# Patient Record
Sex: Male | Born: 1935 | Race: White | Hispanic: No | Marital: Married | Smoking: Never smoker
Health system: Southern US, Community
[De-identification: ages and names within clinical notes are randomized; demographics above are authoritative.]

## PROBLEM LIST (undated history)

## (undated) DIAGNOSIS — E781 Pure hyperglyceridemia: Secondary | ICD-10-CM

## (undated) DIAGNOSIS — R55 Syncope and collapse: Secondary | ICD-10-CM

## (undated) DIAGNOSIS — D696 Thrombocytopenia, unspecified: Secondary | ICD-10-CM

## (undated) DIAGNOSIS — Z91014 Allergy to mammalian meats: Secondary | ICD-10-CM

## (undated) DIAGNOSIS — Z8619 Personal history of other infectious and parasitic diseases: Secondary | ICD-10-CM

## (undated) DIAGNOSIS — K219 Gastro-esophageal reflux disease without esophagitis: Secondary | ICD-10-CM

## (undated) DIAGNOSIS — E785 Hyperlipidemia, unspecified: Secondary | ICD-10-CM

## (undated) DIAGNOSIS — Z833 Family history of diabetes mellitus: Secondary | ICD-10-CM

## (undated) DIAGNOSIS — E039 Hypothyroidism, unspecified: Secondary | ICD-10-CM

## (undated) DIAGNOSIS — Z91018 Allergy to other foods: Secondary | ICD-10-CM

## (undated) HISTORY — DX: Allergy to mammalian meats: Z91.014

## (undated) HISTORY — DX: Hypothyroidism, unspecified: E03.9

## (undated) HISTORY — DX: Thrombocytopenia, unspecified: D69.6

## (undated) HISTORY — DX: Allergy to other foods: Z91.018

## (undated) HISTORY — DX: Pure hyperglyceridemia: E78.1

## (undated) HISTORY — DX: Syncope and collapse: R55

## (undated) HISTORY — DX: Hyperlipidemia, unspecified: E78.5

## (undated) HISTORY — DX: Gastro-esophageal reflux disease without esophagitis: K21.9

## (undated) HISTORY — DX: Family history of diabetes mellitus: Z83.3

## (undated) HISTORY — DX: Personal history of other infectious and parasitic diseases: Z86.19

---
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
## 1997-10-09 ENCOUNTER — Ambulatory Visit (HOSPITAL_BASED_OUTPATIENT_CLINIC_OR_DEPARTMENT_OTHER): Admission: RE | Admit: 1997-10-09 | Discharge: 1997-10-09 | Payer: Self-pay | Admitting: Plastic Surgery

## 2000-04-10 ENCOUNTER — Encounter: Admission: RE | Admit: 2000-04-10 | Discharge: 2000-04-10 | Payer: Self-pay | Admitting: Family Medicine

## 2000-04-10 ENCOUNTER — Encounter: Payer: Self-pay | Admitting: Family Medicine

## 2008-03-20 ENCOUNTER — Encounter: Payer: Self-pay | Admitting: Family Medicine

## 2008-03-20 LAB — CONVERTED CEMR LAB
ALT: 40 units/L
AST: 32 units/L
Albumin: 4.1 g/dL
Alkaline Phosphatase: 68 units/L
Anion Gap: 11.3
BUN: 14 mg/dL
CO2: 29 meq/L
Calcium: 9 mg/dL
Chloride: 103 meq/L
Cholesterol: 260 mg/dL
Creatinine, Ser: 0.8 mg/dL
Free T4: 0.89 ng/dL
GFR calc Af Amer: 113.72 mL/min
GFR calc non Af Amer: 93.99 mL/min
Glucose, Bld: 89 mg/dL
HCT: 46.7 %
HDL: 54 mg/dL
Hemoglobin: 16.3 g/dL
LDL Cholesterol: 108 mg/dL
MCHC: 35 g/dL
MCV: 94.4 fL
Platelets: 131 10*3/uL
Potassium: 4.3 meq/L
RBC: 4.95 M/uL
RDW: 12.5 %
Sodium: 139 meq/L
TSH: 1.83 microintl units/mL
Total Bilirubin: 1.1 mg/dL
Total CHOL/HDL Ratio: 4.81
Total Protein: 6.9 g/dL
Triglycerides: 460 mg/dL
WBC: 4.4 10*3/uL

## 2008-05-19 ENCOUNTER — Encounter: Payer: Self-pay | Admitting: Family Medicine

## 2008-05-19 LAB — CONVERTED CEMR LAB
ALT: 103 units/L
AST: 73 units/L
Albumin: 4.7 g/dL
Alkaline Phosphatase: 110 units/L
Bilirubin, Direct: 0.16 mg/dL
Cholesterol: 221 mg/dL
HDL: 49 mg/dL
LDL Cholesterol: 151 mg/dL
TSH: 2.53 microintl units/mL
Total Bilirubin: 0.6 mg/dL
Total CHOL/HDL Ratio: 2.9
Total Protein: 7.2 g/dL
Triglycerides: 140 mg/dL
VLDL: 28 mg/dL

## 2008-06-15 ENCOUNTER — Emergency Department: Payer: Self-pay | Admitting: Unknown Physician Specialty

## 2008-06-30 ENCOUNTER — Encounter: Payer: Self-pay | Admitting: Family Medicine

## 2008-06-30 LAB — CONVERTED CEMR LAB
ALT: 37 units/L
AST: 32 units/L
Albumin: 3.8 g/dL
Alkaline Phosphatase: 71 units/L
Bilirubin, Direct: 0.1 mg/dL
Indirect Bilirubin: 0.7 mg/dL
Total Bilirubin: 0.8 mg/dL
Total Protein: 6.3 g/dL

## 2008-09-04 ENCOUNTER — Encounter: Payer: Self-pay | Admitting: Family Medicine

## 2008-09-04 LAB — CONVERTED CEMR LAB
ALT: 25 units/L
AST: 26 units/L
Albumin: 3.7 g/dL
Alkaline Phosphatase: 60 units/L
Anion Gap: 12.1
BUN: 14 mg/dL
CO2: 28 meq/L
Calcium: 8.9 mg/dL
Chloride: 102 meq/L
Cholesterol: 182 mg/dL
Creatinine, Ser: 0.8 mg/dL
GFR calc Af Amer: 113.42 mL/min
GFR calc non Af Amer: 93.74 mL/min
Glucose, Bld: 88 mg/dL
HDL: 43 mg/dL
LDL Cholesterol: 110 mg/dL
Potassium: 4.1 meq/L
Sodium: 138 meq/L
TSH: 2.32 microintl units/mL
Total Bilirubin: 1.1 mg/dL
Total CHOL/HDL Ratio: 4.23
Total Protein: 6.1 g/dL
Triglycerides: 113 mg/dL

## 2009-02-03 ENCOUNTER — Encounter: Payer: Self-pay | Admitting: Family Medicine

## 2009-03-23 ENCOUNTER — Encounter: Payer: Self-pay | Admitting: Family Medicine

## 2009-03-23 LAB — CONVERTED CEMR LAB
ALT: 29 units/L
AST: 26 units/L
Albumin: 4.1 g/dL
Alkaline Phosphatase: 60 units/L
Anion Gap: 12.9
BUN: 13 mg/dL
CO2: 29 meq/L
Calcium: 9.3 mg/dL
Chloride: 101 meq/L
Cholesterol: 241 mg/dL
Creatinine, Ser: 0.8 mg/dL
Glucose, Bld: 93 mg/dL
HDL: 49 mg/dL
LDL Cholesterol: 100 mg/dL
PSA: 1.77 ng/mL
Potassium: 4.9 meq/L
Sodium: 138 meq/L
TSH: 2.43 microintl units/mL
Total Bilirubin: 0.9 mg/dL
Total CHOL/HDL Ratio: 4.92
Total Protein: 6.7 g/dL
Triglycerides: 368 mg/dL

## 2009-09-25 ENCOUNTER — Encounter: Payer: Self-pay | Admitting: Family Medicine

## 2009-09-25 DIAGNOSIS — R51 Headache: Secondary | ICD-10-CM

## 2009-09-25 DIAGNOSIS — R519 Headache, unspecified: Secondary | ICD-10-CM | POA: Insufficient documentation

## 2009-09-25 DIAGNOSIS — K219 Gastro-esophageal reflux disease without esophagitis: Secondary | ICD-10-CM

## 2009-09-25 DIAGNOSIS — E785 Hyperlipidemia, unspecified: Secondary | ICD-10-CM | POA: Insufficient documentation

## 2009-09-25 DIAGNOSIS — D696 Thrombocytopenia, unspecified: Secondary | ICD-10-CM

## 2009-09-28 DIAGNOSIS — E039 Hypothyroidism, unspecified: Secondary | ICD-10-CM

## 2009-09-28 DIAGNOSIS — M545 Low back pain, unspecified: Secondary | ICD-10-CM | POA: Insufficient documentation

## 2009-09-28 DIAGNOSIS — M519 Unspecified thoracic, thoracolumbar and lumbosacral intervertebral disc disorder: Secondary | ICD-10-CM | POA: Insufficient documentation

## 2009-09-28 DIAGNOSIS — IMO0001 Reserved for inherently not codable concepts without codable children: Secondary | ICD-10-CM | POA: Insufficient documentation

## 2009-10-13 ENCOUNTER — Ambulatory Visit: Payer: Self-pay | Admitting: Family Medicine

## 2009-10-14 ENCOUNTER — Ambulatory Visit: Payer: Self-pay | Admitting: Family Medicine

## 2009-10-15 LAB — CONVERTED CEMR LAB
Cholesterol: 188 mg/dL (ref 0–200)
HDL: 38.5 mg/dL — ABNORMAL LOW (ref >39.00)
LDL Cholesterol: 111 mg/dL — ABNORMAL HIGH (ref 0–99)
Total CHOL/HDL Ratio: 5
Triglycerides: 193 mg/dL — ABNORMAL HIGH (ref 0.0–149.0)
VLDL: 38.6 mg/dL (ref 0.0–40.0)

## 2009-10-22 ENCOUNTER — Ambulatory Visit: Payer: Self-pay | Admitting: Family Medicine

## 2009-10-22 DIAGNOSIS — K649 Unspecified hemorrhoids: Secondary | ICD-10-CM | POA: Insufficient documentation

## 2009-12-06 ENCOUNTER — Emergency Department (HOSPITAL_COMMUNITY): Admission: EM | Admit: 2009-12-06 | Discharge: 2009-12-06 | Payer: Self-pay | Admitting: Family Medicine

## 2009-12-07 ENCOUNTER — Telehealth: Payer: Self-pay | Admitting: Family Medicine

## 2009-12-07 ENCOUNTER — Ambulatory Visit: Payer: Self-pay | Admitting: Family Medicine

## 2009-12-09 ENCOUNTER — Telehealth: Payer: Self-pay | Admitting: Family Medicine

## 2010-01-13 ENCOUNTER — Ambulatory Visit: Payer: Self-pay | Admitting: Family Medicine

## 2010-03-12 ENCOUNTER — Telehealth (INDEPENDENT_AMBULATORY_CARE_PROVIDER_SITE_OTHER): Payer: Self-pay | Admitting: *Deleted

## 2010-03-16 ENCOUNTER — Telehealth: Payer: Self-pay | Admitting: Family Medicine

## 2010-03-17 ENCOUNTER — Ambulatory Visit: Payer: Self-pay | Admitting: Family Medicine

## 2010-03-17 DIAGNOSIS — I10 Essential (primary) hypertension: Secondary | ICD-10-CM

## 2010-03-17 LAB — CONVERTED CEMR LAB
ALT: 27 units/L (ref 0–53)
AST: 25 units/L (ref 0–37)
Bilirubin, Direct: 0.1 mg/dL (ref 0.0–0.3)
TSH: 3.03 microintl units/mL (ref 0.35–5.50)
Total Bilirubin: 1 mg/dL (ref 0.3–1.2)
Total CHOL/HDL Ratio: 5
Triglycerides: 252 mg/dL — ABNORMAL HIGH (ref 0.0–149.0)

## 2010-03-25 ENCOUNTER — Ambulatory Visit
Admission: RE | Admit: 2010-03-25 | Discharge: 2010-03-25 | Payer: Self-pay | Source: Home / Self Care | Attending: Family Medicine | Admitting: Family Medicine

## 2010-03-25 ENCOUNTER — Encounter: Payer: Self-pay | Admitting: Family Medicine

## 2010-03-28 ENCOUNTER — Telehealth: Payer: Self-pay | Admitting: Family Medicine

## 2010-04-20 NOTE — Assessment & Plan Note (Signed)
Summary: FLU SHOT/CLE  Nurse Visit   Allergies: 1)  ! Sulfa 2)  ! Lipitor (Atorvastatin Calcium) 3)  ! Zocor (Simvastatin) 4)  ! Zetia (Ezetimibe)  Orders Added: 1)  Flu Vaccine 89yrs + MEDICARE PATIENTS [Q2039] 2)  Administration Flu vaccine - MCR [G0008] Flu Vaccine Consent Questions     Do you have a history of severe allergic reactions to this vaccine? no    Any prior history of allergic reactions to egg and/or gelatin? no    Do you have a sensitivity to the preservative Thimersol? no    Do you have a past history of Guillan-Barre Syndrome? no    Do you currently have an acute febrile illness? no    Have you ever had a severe reaction to latex? no    Vaccine information given and explained to patient? yes    Are you currently pregnant? no    Lot Number:[id number]   Exp Date:09/18/2010   Site Given  Left Deltoid IM1

## 2010-04-20 NOTE — Procedures (Signed)
Summary: Colonoscopy/[facility]  Colonoscopy/[facility]   Imported By: Lanelle Bal 10/06/2009 12:47:15  _____________________________________________________________________  External Attachment:    Type:   Image     Comment:   External Document

## 2010-04-20 NOTE — Assessment & Plan Note (Signed)
Summary: blood in stool/alc   Vital Signs:  Patient profile:   77 year old male Weight:      148.50 pounds Temp:     98.0 degrees F oral Pulse rate:   78 / minute Pulse rhythm:   regular BP sitting:   144 / 78  (left arm) Cuff size:   regular  Vitals Entered By: Selena Batten Dance CMA Duncan Dull) (October 22, 2009 9:26 AM) CC: Blood in stool Comments (Bright red blood noted; PMHx of fissure)   History of Present Illness: CC: blood in stool  2d ago noted BRBPR in commode (has been constipated).  Taking metamucil.  No blood, no stool yesterday.  This am still constipated, first stool with blood, second stool without.  + blood with wiping.  No melena, not more malodorous than usual.  Using preparation H.  + h/o hemorrhoids (one bleeding 5 years ago, possibly a fissure).  No pain with stools.  No fevers, chills, abd pain, n/v.  Not on iron.  + colonoscopy 01/2009 with 2 adenomatous polyps removed, no high grade dysplasia, rec to rpt in 5 years.  Takes aspirin daily so bleeds easily.  Allergies: 1)  ! Sulfa 2)  ! Lipitor (Atorvastatin Calcium) 3)  ! Zocor (Simvastatin) 4)  ! Zetia (Ezetimibe)  Past History:  Past medical, surgical, family and social histories (including risk factors) reviewed for relevance to current acute and chronic problems.  Past Medical History: Reviewed history from 09/30/2009 and no changes required. UNSPECIFIED MYALGIA AND MYOSITIS (ICD-729.1) OTHER&UNSPEC DISC DISORDER UNSPEC REGION (ICD-722.90) LOW BACK PAIN (ICD-724.2) UNSPECIFIED THROMBOCYTOPENIA (ICD-287.5) HYPOTHYROIDISM (ICD-244.9) FAMILY HISTORY DIABETES 1ST DEGREE RELATIVE (ICD-V18.0) CHICKENPOX, HX OF (ICD-V15.9) HYPERLIPIDEMIA (ICD-272.4) HEADACHE (ICD-784.0) GERD (ICD-530.81) Per old records, patient has had zostavax  Past Surgical History: Reviewed history from 09/25/2009 and no changes required. 1947   Appendix  Family History: Reviewed history from 09/25/2009 and no changes required. Family  History of Arthritis Family History Diabetes 1st degree relative Family History High cholesterol Family History Hypertension Family History of Stroke F 1st degree relative <60 Father: Deceased 39 yrs. pneumonia Mother: Deceased 62 yrs. stroke, borderline diabetic Siblings: Brothers:  1 deceased Alzheimer's dementia, died of staph infection at age 50  Sister: 3, 1 alive at 95 years, high BP, high cholesterol, HOH and poor sight, 1 alive  at 32 with colon CA at age 27, 4th sister deceased at age 71, poor health.  Social History: Reviewed history from 10/13/2009 and no changes required. Married since 1953 Children:  4, 3 sons and 1 daughter Retired from [employer] Never Smoked Alcohol use- yes, wine rarely Caffeine - Coffee, tea, 2+ daily. Diet:  Fish, chicken, vegetables and a little red meat Drug use-no Regular exercise-yes, yard work, gardening, cutting wood  Review of Systems       per HPI  Physical Exam  General:  Well-developed,well-nourished,in no acute distress; alert,appropriate and cooperative throughout examination Abdomen:  Bowel sounds positive,abdomen soft and non-tender without masses, organomegaly or hernias noted. Rectal:  + ext hemorrhoids, no blood, no fissure.  Normal sphincter tone. No rectal masses.  + mild tenderness when palpating right rectal vault, possible small int hemorrhoid palpated.  hemoccult negative Prostate:  Prostate gland firm and smooth, no enlargement, nodularity, tenderness, mass, asymmetry or induration.   Impression & Recommendations:  Problem # 1:  BLOOD IN STOOL (ICD-578.1) hemoccult negative today.  no evidence of fissure on exam today.  + ext hemorrhoids, likely internal as well (although not commented on recent  colonoscopy).  I believe bleeding came from there.  Recent colonoscopy reassuring.  Discussed control of constipation as initial treatment for hemorrhoids and bleeding.  treat with miralax, HC cream, continue metamucil.  RTC  1-2 wks if no improvement.  Problem # 2:  HEMORRHOIDS (ICD-455.6) see above.  Complete Medication List: 1)  Synthroid 50 Mcg Tabs (Levothyroxine sodium) .... Take 1 tablet by mouth once a day 2)  Red Yeast Rice Powd (Red yeast rice extract) .Marland Kitchen.. 1200 mg. once daily 3)  Co Q-10 30 Mg Caps (Coenzyme q10) .Marland Kitchen.. 100 mg. daily 4)  Multivitamins Tabs (Multiple vitamin) .... Take 1 tablet by mouth once a day 5)  Fish Oil Oil (Fish oil) .... 2800 mg.   takes 2 per day 6)  Folic Acid Powd (Folic acid) .... 400 mg. take 1 tablet by mouth once a day. 7)  Aspirin 81 Mg Tabs (Aspirin) .... Take 1 tablet by mouth once a day 8)  Niacin 250 Mg Tabs (Niacin) .... Take 1 tablet by mouth once a day 9)  Miralax Powd (Polyethylene glycol 3350) .Marland Kitchen.. 17gm in on 8oz fluid daily as needed constipation 10)  Anusol-hc 2.5 % Crea (Hydrocortisone) .... Apply to bottom twice daily  Patient Instructions: 1)  I think you have a flare up of your hemorrhoids. 2)  Treatment for this is ensuring one loose stool a day. 3)  For this, we will start you on miralax daily until you are at goal. 4)  Use steroid cream to bottom. 5)  Please return if not improved in 1-2 weeks, call clinic with questions. 6)  Pleasure to meet you today. Prescriptions: ANUSOL-HC 2.5 % CREA (HYDROCORTISONE) apply to bottom twice daily  #1 x 1   Entered and Authorized by:   Eustaquio Boyden  MD   Signed by:   Eustaquio Boyden  MD on 10/22/2009   Method used:   Electronically to        [facility] (retail)       [address]       [place], [place]  [address]       Ph: [phone] or [phone]       Fax: [phone]   RxID:   [id number] MIRALAX  POWD (POLYETHYLENE GLYCOL 3350) 17gm in on 8oz fluid daily as needed constipation  #1 x 1   Entered and Authorized by:   Eustaquio Boyden  MD   Signed by:   Eustaquio Boyden  MD on 10/22/2009   Method used:   [facility] to        [facility]  (retail)       [address]       [place], [place]  [address]       Ph: [phone] or [phone]       Fax: [phone]   RxID:   [id number]   Current Allergies (reviewed today): ! SULFA ! LIPITOR (ATORVASTATIN CALCIUM) ! ZOCOR (SIMVASTATIN) ! ZETIA (EZETIMIBE)  Appended Document: blood in stool/alc   Appended Document: blood in stool/alc    Clinical Lists Changes  Orders: Added new Service order of Prescription Created Electronically [phone]) - Signed

## 2010-04-20 NOTE — Assessment & Plan Note (Signed)
Summary: 2:30 TRANSFER FROM [facility]/[facility]   Vital Signs:  Patient profile:   77 year old male Height:      64.75 inches Weight:      147.50 pounds BMI:     24.82 Temp:     98.4 degrees F oral Pulse rate:   84 / minute Pulse rhythm:   regular BP sitting:   124 / 68  (left arm) Cuff size:   regular  Vitals Entered By: Delilah Shan CMA  Dull) (October 13, 2009 2:15 PM) CC: Transfer from [place]   History of Present Illness: Elevated Cholesterol: Using medications without problems:yes, but had some itching on higher dose of niacin Muscle aches: no Other complaints: no  Prev labs reviewed with patient.    Problems Prior to Update: 1)  Unspecified Myalgia and Myositis  (ICD-729.1) 2)  Other&unspec Disc Disorder Unspec Region  (ICD-722.90) 3)  Low Back Pain  (ICD-724.2) 4)  Unspecified Thrombocytopenia  (ICD-287.5) 5)  Hypothyroidism  (ICD-244.9) 6)  Family History Diabetes 1st Degree Relative  (ICD-V18.0) 7)  Chickenpox, Hx of  (ICD-V15.9) 8)  Hyperlipidemia  (ICD-272.4) 9)  Headache  (ICD-784.0) 10)  Gerd  (ICD-530.81)  Allergies: 1)  ! Sulfa 2)  ! Lipitor (Atorvastatin Calcium) 3)  ! Zocor (Simvastatin) 4)  ! Zetia (Ezetimibe)  Social History: Reviewed history from 09/25/2009 and no changes required. Married since 1953 Children:  4, 3 sons and 1 daughter Retired from [employer] Never Smoked Alcohol use- yes, wine rarely Caffeine - Coffee, tea, 2+ daily. Diet:  Fish, chicken, vegetables and a little red meat Drug use-no Regular exercise-yes, yard work, gardening, cutting wood  Review of Systems       See HPI.  Otherwise negative.    Physical Exam  General:  GEN: nad, alert and oriented HEENT: mucous membranes moist NECK: supple w/o LA CV: rrr PULM: ctab, no inc wob ABD: soft, +bs EXT: no edema SKIN: no acute rash    Impression & Recommendations:  Problem # 1:  HYPERLIPIDEMIA (ICD-272.4) Return for labs. No change in meds.  The following medications  were removed from the medication list:    Niacin 500 Mg Tabs (Niacin) .Marland Kitchen... Take 1 tablet by mouth once a day His updated medication list for this problem includes:    Niacin 250 Mg Tabs (Niacin) .Marland Kitchen... Take 1 tablet by mouth once a day  Complete Medication List: 1)  Synthroid 50 Mcg Tabs (Levothyroxine sodium) .... Take 1 tablet by mouth once a day 2)  Red Yeast Rice Powd (Red yeast rice extract) .Marland Kitchen.. 1200 mg. once daily 3)  Co Q-10 30 Mg Caps (Coenzyme q10) .Marland Kitchen.. 100 mg. daily 4)  Multivitamins Tabs (Multiple vitamin) .... Take 1 tablet by mouth once a day 5)  Fish Oil Oil (Fish oil) .... 2800 mg.   takes 2 per day 6)  Folic Acid Powd (Folic acid) .... 400 mg. take 1 tablet by mouth once a day. 7)  Aspirin 81 Mg Tabs (Aspirin) .... Take 1 tablet by mouth once a day 8)  Niacin 250 Mg Tabs (Niacin) .... Take 1 tablet by mouth once a day  Patient Instructions: 1)  Return for fasting labs.   2)  lipid- 272.4 3)  We'll contact you with your lab report.  4)  Please schedule a follow-up appointment in 6-7 months for a physical.   Current Allergies (reviewed today): ! SULFA ! LIPITOR (ATORVASTATIN CALCIUM) ! ZOCOR (SIMVASTATIN) ! ZETIA (EZETIMIBE)

## 2010-04-20 NOTE — Progress Notes (Signed)
Summary: given doxy for tick bite  Phone Note Call from Patient   Caller: Patient Call For: Crawford Givens MD Summary of Call: Pt called to let you know that he was given doxycycline in the past after a tick bite, not a shot as he had thought. Initial call taken by: Lowella Petties CMA,  December 09, 2009 8:47 AM  Follow-up for Phone Call        noted.  no action needed on this message.  Follow-up by: Crawford Givens MD,  December 09, 2009 11:40 AM

## 2010-04-20 NOTE — Assessment & Plan Note (Signed)
Summary: F/U [facility] ON 12/06/09/[facility]   Vital Signs:  Patient profile:   77 year old male Height:      64.75 inches Weight:      150.04 pounds BMI:     25.25 Temp:     98.4 degrees F oral Pulse rate:   74 / minute Pulse rhythm:   regular BP sitting:   120 / 70  (left arm) Cuff size:   regular  Vitals Entered By: Benny Lennert CMA  Dull) (December 07, 2009 3:30 PM)  History of Present Illness: Chief complaint follow up [facility] 12-06-2009 Probably bitten about 1 week before sx started.  Felt bad Saturday night and had chills Saturday.  To UC and started on doxy.  No fevers, sweats, aches in meantime.  Feeling much better since going on doxy.  Labs reviewed with patient, titers negative. tolerating meds.   Allergies: 1)  ! Sulfa 2)  ! Lipitor (Atorvastatin Calcium) 3)  ! Zocor (Simvastatin) 4)  ! Zetia (Ezetimibe)  Review of Systems       See HPI.  Otherwise negative.    Physical Exam  General:  GEN: nad, alert and oriented HEENT: mucous membranes moist NECK: supple w/o LA CV: regular rate and rhythm  PULM: ctab, no inc wob ABD: soft, +bs, resolving lesion on L lower abdominal wall at waist band, minimal erythema and healing scab noted at tick bite site . EXT: no edema SKIN: no acute rash  o/w   Impression & Recommendations:  Problem # 1:  TICK BITE (ICD-E906.4) Labs are wnl from UC.  No change in meds.  I would finish the antibiotics.  d/w patient and he understands.  Doing well.    Complete Medication List: 1)  Synthroid 50 Mcg Tabs (Levothyroxine sodium) .... Take 1 tablet by mouth once a day 2)  Red Yeast Rice Powd (Red yeast rice extract) .Marland Kitchen.. 1200 mg. once daily 3)  Co Q-10 30 Mg Caps (Coenzyme q10) .Marland Kitchen.. 100 mg. daily 4)  Multivitamins Tabs (Multiple vitamin) .... Take 1 tablet by mouth once a day 5)  Fish Oil Oil (Fish oil) .... 2800 mg.   takes 2 per day 6)  Folic Acid Powd (Folic acid) .... 400 mg. take 1 tablet by mouth once a day. 7)   Aspirin 81 Mg Tabs (Aspirin) .... Take 1 tablet by mouth once a day 8)  Niacin 250 Mg Tabs (Niacin) .... Take 1 tablet by mouth once a day 9)  Miralax Powd (Polyethylene glycol 3350) .Marland Kitchen.. 17gm in on 8oz fluid daily as needed constipation 10)  Anusol-hc 2.5 % Crea (Hydrocortisone) .... Apply to bottom twice daily 11)  Doxycycline Hyclate 100 Mg Solr (Doxycycline hyclate) .... Take one tablet by mouth 2 times daily for 10 days  Patient Instructions: 1)  Keep taking the antibiotics.  Take all of the pills. Let me know if you have other concerns.    Current Allergies (reviewed today): ! SULFA ! LIPITOR (ATORVASTATIN CALCIUM) ! ZOCOR (SIMVASTATIN) ! ZETIA (EZETIMIBE)

## 2010-04-20 NOTE — Progress Notes (Signed)
Summary: call a nurse   Phone Note Call from Patient   Call For: Crawford Givens MD Summary of Call: Triage Record Num: [id number] Operator: Valene Bors Patient Name: Larry [name] Call Date & Time: 12/06/2009 9:46:49AM Patient Phone: [phone] PCP: Patient Gender: Male PCP Fax : Patient DOB: May 24, 1929 Practice Name: [facility] Reason for Call: Larry [name] found a tick on his belly 11/28/09 and he started to feel achy on Friday 12/04/09. He is coughing and has some chills last night; no rash or headache. Care advice per Insect bite protocol and advised to be seen within 4 hours. He is going to go to [facility] UC today -12/06/09. Protocol(s) Used: Bites and Stings - Insects or Spiders Recommended Outcome per Protocol: See Larry [name]  within 4 hours Reason for Outcome: History of tick bite AND now has rash, fever, headache, joint or muscle pain or swollen lymph glands Care Advice:  ~ Call Larry [name] if symptoms worsen or new symptoms develop. Call EMS 911 if develop change in level of consciousness, paralysis, difficulty breathing, chest pain or palpitations, or severe signs dehydration.  ~ CDC does not recommend tetanus prophylaxis for insect bites. But this can be a good time to check and confirm that tetanus is up to date.  ~  ~ SYMPTOM / CONDITION MANAGEMENT  ~ CAUTIONS Analgesic/Antipyretic Advice - Acetaminophen: Consider acetaminophen as directed on label or by pharmacist/Larry [name] for pain or fever PRECAUTIONS: - Use if there is no history of liver disease, alcoholism, or intake of three or more alcohol drinks per day - Only if approved by Larry [name] during pregnancy or when breastfeeding - During pregnancy, acetaminophen should not be taken more than 3 consecutive days without telling Larry [name] - Do not exceed recommended dose or frequency  ~ Analgesic/Antipyretic Advice - NSAIDs: Consider aspirin, ibuprofen, naproxen or ketoprofen for pain or fever as directed on label or by  pharmacist/Larry [name]. PRECAUTIONS: - If over 62 years of age, should not take longer than 1 week without consulting Larry [name]. EXCEPTIONS: - Should not be used if Initial call taken by: Larry [name],  December 07, 2009 8:29 AM  Follow-up for Phone Call        noted.  Follow-up by: Crawford Givens MD,  December 07, 2009 8:42 AM

## 2010-04-22 NOTE — Progress Notes (Signed)
  Phone Note Outgoing Call   Summary of Call: Please call pt and pass this along.  Pt was asking about fish oil vs. squid oil at the last OV.  Pleast tell him that I wouldn't find head to head studies, but they may have some of the same components.  For now, I'd stay with the fish oil. Thanks.  Initial call taken by: Crawford Givens MD,  March 28, 2010 12:39 PM  Follow-up for Phone Call        Patient Advised.  Follow-up by: Delilah Shan CMA (AAMA),  March 29, 2010 8:30 AM

## 2010-04-22 NOTE — Assessment & Plan Note (Signed)
Summary: CPX/CLE   Vital Signs:  Patient profile:   77 year old male Height:      64.75 inches Weight:      150.75 pounds BMI:     25.37 Temp:     97.9 degrees F oral Pulse rate:   72 / minute Pulse rhythm:   regular BP sitting:   130 / 80  (left arm) Cuff size:   regular  Vitals Entered By: Delilah Shan CMA  Dull) (March 25, 2010 11:33 AM) CC: Preventive Care  Vision Screening:Left eye with correction: 20 / 30 Right eye with correction: 20 / 30 Both eyes with correction: 20 / 25        Vision Entered By: Delilah Shan CMA (AAMA) (March 25, 2010 11:53 AM)  Hearing Screen 25db HL: Left  500 hz: 25db 1000 hz: 25db 2000 hz: No Response 4000 hz: No Response Right  500 hz: 25db 1000 hz: No Response 2000 hz: No Response 4000 hz: No Response    History of Present Illness: Medicare AWV. Labs reviwed.  See scanned forms.   Elevated Cholesterol: Using medications without problems:yes Muscle aches: no Other complaints: no.  Has increase in TG but increase in sweets over xmas.   H/o hypothyroidism with normal TSH and no other new symptoms.  No neck pain.  Feeling well.    I have personally reviewed the Medicare Annual Wellness questionnaire and have noted 1.   The patient's medical and social history 2.   Their use of alcohol, tobacco or illicit drugs 3.   Their current medications and supplements 4.   The patient's functional ability including ADL's, fall risks, home safety risks and hearing or visual             impairment. 5.   Diet and physical activities 6.   Evidence for depression or mood disorders  The patients weight, height, BMI and visual acuity have been recorded in the chart I have made referrals, counseling and provided education to the patient based review of the above and I have provided the pt with a written personalized care plan for preventive services.    Preventive Screening-Counseling & Management  Alcohol-Tobacco     Smoking Status:  quit  Current Medications (verified): 1)  Synthroid 50 Mcg Tabs (Levothyroxine Sodium) .... Take 1 Tablet By Mouth Once A Day 2)  Red Yeast Rice   Powd (Red Yeast Rice Extract) .Marland Kitchen.. 1200 Mg. Once Daily 3)  Co Q-10 30 Mg  Caps (Coenzyme Q10) .Marland Kitchen.. 100 Mg. Daily 4)  Multivitamins   Tabs (Multiple Vitamin) .... Take 1 Tablet By Mouth Once A Day 5)  Fish Oil   Oil (Fish Oil) .Marland Kitchen.. 1200  Mg.   Takes 2 Per Day 6)  Folic Acid   Powd (Folic Acid) .... 400 Mg. Take 1 Tablet By Mouth Once A Day. 7)  Aspirin 81 Mg  Tabs (Aspirin) .... Take 1 Tablet By Mouth Once A Day 8)  Niacin 500 Mg Tabs (Niacin) .... Take 1 Tablet By Mouth Once A Day  Allergies: 1)  ! Sulfa 2)  ! Lipitor (Atorvastatin Calcium) 3)  ! Zocor (Simvastatin) 4)  ! Zetia (Ezetimibe) 5)  ! * Amtihistamines  Past History:  Past Medical History: Last updated: 09/30/2009 UNSPECIFIED MYALGIA AND MYOSITIS (ICD-729.1) OTHER&UNSPEC DISC DISORDER UNSPEC REGION (ICD-722.90) LOW BACK PAIN (ICD-724.2) UNSPECIFIED THROMBOCYTOPENIA (ICD-287.5) HYPOTHYROIDISM (ICD-244.9) FAMILY HISTORY DIABETES 1ST DEGREE RELATIVE (ICD-V18.0) CHICKENPOX, HX OF (ICD-V15.9) HYPERLIPIDEMIA (ICD-272.4) HEADACHE (ICD-784.0) GERD (ICD-530.81) Per old  records, patient has had zostavax  Past Surgical History: Last updated: 09/25/2009 1949   Appendix  Family History: Last updated: 03/25/2010 Family History of Arthritis Family History Diabetes 1st degree relative Family History High cholesterol Family History Hypertension Family History of Stroke F 1st degree relative <60 Father: Deceased 21 yrs. pneumonia Mother: Deceased 1 yrs. stroke, borderline diabetic Siblings: Brothers:  1 deceased Alzheimer's dementia, died of staph infection at age 33  Sister: 3, 1 alive at 90+ years, high BP, high cholesterol, HOH and poor sight, 1 alive  at 20+ with colon CA at age 53, 2 sisters dead.  Social History: Last updated: 03/25/2010 Married since 1953 Children:  4,  3 sons and 1 daughter Retired from [employer] Alcohol use- yes, wine rarely Caffeine - Coffee, tea, 2+ daily. Diet:  Fish, chicken, vegetables and a little red meat Drug use-no Regular exercise-yes, yard work, gardening, cutting wood Former Smoker  Risk Factors: Exercise: yes (09/25/2009)  Risk Factors: Smoking Status: quit (03/25/2010)  Family History: Reviewed history from 09/25/2009 and no changes required. Family History of Arthritis Family History Diabetes 1st degree relative Family History High cholesterol Family History Hypertension Family History of Stroke F 1st degree relative <60 Father: Deceased 23 yrs. pneumonia Mother: Deceased 7 yrs. stroke, borderline diabetic Siblings: Brothers:  1 deceased Alzheimer's dementia, died of staph infection at age 85  Sister: 3, 1 alive at 90+ years, high BP, high cholesterol, HOH and poor sight, 1 alive  at 38+ with colon CA at age 25, 2 sisters dead.  Social History: Reviewed history from 10/13/2009 and no changes required. Married since 1953 Children:  4, 3 sons and 1 daughter Retired from [employer] Alcohol use- yes, wine rarely Caffeine - Coffee, tea, 2+ daily. Diet:  Fish, chicken, vegetables and a little red meat Drug use-no Regular exercise-yes, yard work, gardening, cutting wood Former SmokerSmoking Status:  quit  Review of Systems       See HPI.  Otherwise negative.  Feeling well.   Physical Exam  General:  GEN: nad, alert and oriented HEENT: mucous membranes moist NECK: supple w/o LA CV: regular rate and rhythm  PULM: ctab, no inc wob ABD: soft, +bs EXT: no edema SKIN: no acute rash prostate smooth and w/o asymmetry stool heme neg.    Impression & Recommendations:  Problem # 1:  Preventive Health Care (ICD-V70.0) D/w patient [id number].  No indicaiton for PSA at this point given exam and potential lack of benefit with PSA in general (esp with neg FH).  He understood and was aware of change in  guidelines.  Healthy habits encouraged.  He'll consider ENT/audiology referral.    Problem # 2:  HYPOTHYROIDISM (ICD-244.9) No change in meds.  His updated medication list for this problem includes:    Synthroid 50 Mcg Tabs (Levothyroxine sodium) .Marland Kitchen... Take 1 tablet by mouth once a day  Problem # 3:  HYPERLIPIDEMIA (ICD-272.4) Return for follow up of lipids and work on diet in meantime.  No other change in meds.   The following medications were removed from the medication list:    Niacin 250 Mg Tabs (Niacin) .Marland Kitchen... Take 1 tablet by mouth once a day His updated medication list for this problem includes:    Niacin 500 Mg Tabs (Niacin) .Marland Kitchen... Take 1 tablet by mouth once a day  Complete Medication List: 1)  Synthroid 50 Mcg Tabs (Levothyroxine sodium) .... Take 1 tablet by mouth once a day 2)  Red Yeast Rice Powd (Red yeast  rice extract) .Marland Kitchen.. 1200 mg. once daily 3)  Co Q-10 30 Mg Caps (Coenzyme q10) .Marland Kitchen.. 100 mg. daily 4)  Multivitamins Tabs (Multiple vitamin) .... Take 1 tablet by mouth once a day 5)  Fish Oil Oil (Fish oil) .Marland Kitchen.. 1200  mg.   takes 2 per day 6)  Folic Acid Powd (Folic acid) .... 400 mg. take 1 tablet by mouth once a day. 7)  Aspirin 81 Mg Tabs (Aspirin) .... Take 1 tablet by mouth once a day 8)  Niacin 500 Mg Tabs (Niacin) .... Take 1 tablet by mouth once a day  Other Orders: Medicare -1st Annual Wellness Visit [phone])  Colorectal Screening:  Current Recommendations:    Hemoccult: NEG X 1 today  PSA Screening:    PSA: 1.77  (03/23/2009)  Immunization & Chemoprophylaxis:    Tetanus vaccine: Td  (09/19/2002)    Influenza vaccine: Fluvax 3+  (01/13/2010)    Pneumovax: given  (10/18/2001)  Patient Instructions: 1)  Recheck lipids in 6 months.  Fasting.  No OV needed.  272.0 2)  I would think about the hearing clinic- let me know if you want a referral.  3)  Take care.  Glad to see you.    Orders Added: 1)  Est. Patient 65& > [99397] 2)  Est. Patient Level III  [91478] 3)  Medicare -1st Annual Wellness Visit [G0438]   Immunization History:  Zostavax History:    Zostavax # 1:  zostavax (03/22/2007)   Immunization History:  Zostavax History:    Zostavax # 1:  Zostavax (03/22/2007)  Current Allergies (reviewed today): ! SULFA ! LIPITOR (ATORVASTATIN CALCIUM) ! ZOCOR (SIMVASTATIN) ! ZETIA (EZETIMIBE) ! * AMTIHISTAMINES    Vital Signs:  Patient Profile:   77 year old male Height:     64.75 inches Weight:      150.75 pounds BMI:     25.37 Temp:     97.9 degrees F oral Pulse rate:   72 / minute Pulse rhythm:   regular BP sitting:   130 / 80 Cuff size:   regular              Vision Screening: Left eye with correction: 20 / 30 Right eye with correction: 20 / 30 Both eyes with correction: 20 / 25      Prevention & Chronic Care Immunizations   Influenza vaccine: Fluvax 3+  (01/13/2010)    Tetanus booster: 09/19/2002: Td    Pneumococcal vaccine: given  (10/18/2001)    H. zoster vaccine: 03/22/2007: Zostavax  Colorectal Screening   Hemoccult: Not documented   Hemoccult action/deferral: NEG X 1 today  (03/25/2010)    Colonoscopy: normal  (09/23/2008)   Colonoscopy due: 09/2013  Other Screening   PSA: 1.77  (03/23/2009)   Smoking status: quit  (03/25/2010)  Lipids   Total Cholesterol: 213  (03/17/2010)   LDL: 111  (10/14/2009)   LDL Direct: 102.6  (03/17/2010)   HDL: 40.90  (03/17/2010)   Triglycerides: 252.0  (03/17/2010)    SGOT (AST): 25  (03/17/2010)   SGPT (ALT): 27  (03/17/2010)   Alkaline phosphatase: 54  (03/17/2010)   Total bilirubin: 1.0  (03/17/2010)  Hypertension   Last Blood Pressure: 130 / 80  (03/25/2010)   Serum creatinine: 0.80  (03/23/2009)   Serum potassium 4.9  (03/23/2009)  Self-Management Support :    Hypertension self-management support: Not documented    Lipid self-management support: Not documented

## 2010-04-22 NOTE — Progress Notes (Signed)
Summary: took too much synthroid  Phone Note Call from Patient   Caller: Patient Call For: Crawford Givens MD Summary of Call: Pt states he accidentally took 2 doses of synthroid today.  He is asking if that is a problem.  Advised no, one day wont hurt him. Initial call taken by: Lowella Petties CMA, AAMA,  March 16, 2010 12:52 PM  Follow-up for Phone Call        agreed, would start back on regular dose tomorrow.   Follow-up by: Crawford Givens MD,  March 17, 2010 12:57 PM

## 2010-04-22 NOTE — Progress Notes (Signed)
----   Converted from flag ---- ---- 03/11/2010 12:12 PM, Crawford Givens MD wrote: 244.9 tsh cmet/lipid 272.4  ---- 03/11/2010 11:23 AM, Liane Comber CMA (AAMA) wrote: Lab orders please! Good Morning! This pt is scheduled for cpx labs Wed, which labs to draw and dx codes to use? Thanks Tasha ------------------------------

## 2010-04-22 NOTE — Letter (Signed)
Summary: Nature conservation officer [employer] Wellness Visit Questionnaire   [employer] Medicare Annual Wellness Visit Questionnaire   Imported By: Beau Fanny 03/30/2010 15:17:03  _____________________________________________________________________  External Attachment:    Type:   Image     Comment:   External Document

## 2010-05-11 ENCOUNTER — Ambulatory Visit (INDEPENDENT_AMBULATORY_CARE_PROVIDER_SITE_OTHER): Payer: Medicare Other | Admitting: Family Medicine

## 2010-05-11 ENCOUNTER — Encounter: Payer: Self-pay | Admitting: Family Medicine

## 2010-05-11 DIAGNOSIS — J310 Chronic rhinitis: Secondary | ICD-10-CM | POA: Insufficient documentation

## 2010-05-18 NOTE — Assessment & Plan Note (Signed)
Summary: RUNNY NOSE,WATERY EYES,COUGH/CLE  MEDICARE/BCBS   Vital Signs:  Patient profile:   77 year old male Height:      64.75 inches Weight:      155.50 pounds BMI:     26.17 Temp:     98.4 degrees F oral Pulse rate:   76 / minute Pulse rhythm:   regular BP sitting:   128 / 74  (left arm) Cuff size:   regular  Vitals Entered By: Delilah Shan CMA Mihaela Fajardo Dull) (May 11, 2010 11:41 AM) CC: Runny nose, watery eyes   History of Present Illness: Sick contats.  Using vinegar, honey, pepper, and ginger.  Used zicam, nasal saline and tylenol/ibuprofen.   Started with symptoms Saturday night.  Rhinorrhea, watery eyes, sneezing.  R nasal congestion, >L side.  No fevers known.  No cough, no sputum.  Can't take antihistamines due to h/o urinary retention.   Allergies: 1)  ! Sulfa 2)  ! Lipitor (Atorvastatin Calcium) 3)  ! Zocor (Simvastatin) 4)  ! Zetia (Ezetimibe) 5)  ! * Amtihistamines  Review of Systems       See HPI.  Otherwise negative.    Physical Exam  General:  no apparent distress normocephalic atraumatic tm wnl nasal exam with clear rhinorrhea op with cobblestoning, mucous membranes moist bilateral lower eyelids puffy, conjunctiva wnl bilaterally neck supple w/o LA regular rate and rhythm clear to auscultation bilaterally    Impression & Recommendations:  Problem # 1:  RHINITIS (ICD-472.0) Likey viral process.  I would observe for now as he is gradually improving.  If persistent, he can fill the flonase and use as needed once daily.  Rest and fluids in meantime.  He agrees.  he has no fever, his rhinorrhea is clear, and he has no max/frontal sinus tenderness.  Okay for outpatient follow up.   Complete Medication List: 1)  Synthroid 50 Mcg Tabs (Levothyroxine sodium) .... Take 1 tablet by mouth once a day 2)  Red Yeast Rice Powd (Red yeast rice extract) .Marland Kitchen.. 1200 mg. once daily 3)  Co Q-10 30 Mg Caps (Coenzyme q10) .Marland Kitchen.. 100 mg. daily 4)  Multivitamins Tabs  (Multiple vitamin) .... Take 1 tablet by mouth once a day 5)  Fish Oil Oil (Fish oil) .Marland Kitchen.. 1200  mg.   takes 1 per day 6)  Folic Acid Powd (Folic acid) .... 400 mg. take 1 tablet by mouth once a day. 7)  Aspirin 81 Mg Tabs (Aspirin) .... Take 1 tablet by mouth once a day 8)  Niacin 500 Mg Tabs (Niacin) .... Take 1 tablet by mouth once a day 9)  Mega Red  .... Once daily 10)  Flonase 50 Mcg/act Susp (Fluticasone propionate) .... 2 sprays per nostril per day  Patient Instructions: 1)  I wouldn't change your meds.  Keep drinking plenty of fluids and get some rest.  I would use the nasal spray in a few days if you aren't getting better.  Call me with concerns in the meantime.  Take care.  Prescriptions: FLONASE 50 MCG/ACT SUSP (FLUTICASONE PROPIONATE) 2 sprays per nostril per day  #1 x 1   Entered and Authorized by:   Crawford Givens MD   Signed by:   Crawford Givens MD on 05/11/2010   Method used:   Print then Give to Patient   RxID:   [id number]    Orders Added: 1)  Est. Patient Level III [14782]    Current Allergies (reviewed today): ! SULFA ! LIPITOR (ATORVASTATIN CALCIUM) ! ZOCOR (  SIMVASTATIN) ! ZETIA (EZETIMIBE) ! * AMTIHISTAMINES

## 2010-06-03 LAB — ROCKY MTN SPOTTED FVR AB, IGM-BLOOD: RMSF IgM: 0.07 IV (ref 0.00–0.89)

## 2010-06-21 ENCOUNTER — Other Ambulatory Visit: Payer: Self-pay | Admitting: Family Medicine

## 2010-06-22 ENCOUNTER — Other Ambulatory Visit: Payer: Self-pay | Admitting: *Deleted

## 2010-06-22 MED ORDER — LEVOTHYROXINE SODIUM 50 MCG PO TABS: 50.0000 ug | ORAL_TABLET | Freq: Every day | ORAL | Status: DC

## 2010-09-29 ENCOUNTER — Other Ambulatory Visit: Payer: Self-pay | Admitting: Family Medicine

## 2010-09-29 DIAGNOSIS — E7800 Pure hypercholesterolemia, unspecified: Secondary | ICD-10-CM

## 2010-09-29 DIAGNOSIS — E039 Hypothyroidism, unspecified: Secondary | ICD-10-CM

## 2010-10-01 ENCOUNTER — Other Ambulatory Visit (INDEPENDENT_AMBULATORY_CARE_PROVIDER_SITE_OTHER): Payer: Medicare Other | Admitting: Family Medicine

## 2010-10-01 DIAGNOSIS — E7800 Pure hypercholesterolemia, unspecified: Secondary | ICD-10-CM

## 2010-10-01 DIAGNOSIS — E039 Hypothyroidism, unspecified: Secondary | ICD-10-CM

## 2010-10-01 LAB — LIPID PANEL
HDL: 48 mg/dL (ref >39.00)
VLDL: 98.6 mg/dL — ABNORMAL HIGH (ref 0.0–40.0)

## 2010-10-01 LAB — LDL CHOLESTEROL, DIRECT: Direct LDL: 98.1 mg/dL

## 2010-10-01 LAB — COMPREHENSIVE METABOLIC PANEL
ALT: 26 U/L (ref 0–53)
AST: 22 U/L (ref 0–37)
Calcium: 8.9 mg/dL (ref 8.4–10.5)
Chloride: 106 mEq/L (ref 96–112)
Creatinine, Ser: 0.7 mg/dL (ref 0.4–1.5)
Potassium: 4 mEq/L (ref 3.5–5.1)

## 2010-10-01 LAB — TSH: TSH: 3.32 u[IU]/mL (ref 0.35–5.50)

## 2010-11-15 ENCOUNTER — Ambulatory Visit (INDEPENDENT_AMBULATORY_CARE_PROVIDER_SITE_OTHER): Payer: Medicare Other | Admitting: Family Medicine

## 2010-11-15 ENCOUNTER — Encounter: Payer: Self-pay | Admitting: Family Medicine

## 2010-11-15 DIAGNOSIS — J310 Chronic rhinitis: Secondary | ICD-10-CM

## 2010-11-15 DIAGNOSIS — W57XXXA Bitten or stung by nonvenomous insect and other nonvenomous arthropods, initial encounter: Secondary | ICD-10-CM | POA: Insufficient documentation

## 2010-11-15 DIAGNOSIS — T148XXA Other injury of unspecified body region, initial encounter: Secondary | ICD-10-CM

## 2010-11-15 MED ORDER — DOXYCYCLINE HYCLATE 100 MG PO TABS: 100.0000 mg | ORAL_TABLET | Freq: Two times a day (BID) | ORAL | Status: AC

## 2010-11-15 NOTE — Progress Notes (Signed)
Tick bites.  15 were attached yesterday in groin, near waistband.  They were engorged.  No FCNAV.   Cough with small amount of sputum in AM, for years.  No help with mucinex.  Head of bed elevated.  Hasn't tried antihistamines. No other resp sx.  No ST.  Meds, vitals, and allergies reviewed.   ROS: See HPI.  Otherwise, noncontributory.  nad ncat Tm wnl Nasal and op wnl Neck supple w/o LA ctab rrr Skin with multiple erythematous blanching irritated papular lesions in the groin, on the penis, and on the buttocks.  No adherent ticks noted now. No purulent discharge noted.

## 2010-11-15 NOTE — Patient Instructions (Addendum)
Start the doxycycline today and try taking claritin 10mg  at night for the cough.  Let me know if you have other concerns.  Glad to see you today.

## 2010-11-15 NOTE — Assessment & Plan Note (Signed)
Possible cause of the AM sputum.  Try PM antihistamine.  Nontoxic.

## 2010-11-15 NOTE — Assessment & Plan Note (Addendum)
Given the number and the appearance, I would treat.  D/w pt and he understood.  Has tolerated doxy prev.  D/w pt about tick avoidance.  F/u prn.

## 2010-12-30 ENCOUNTER — Ambulatory Visit (INDEPENDENT_AMBULATORY_CARE_PROVIDER_SITE_OTHER): Payer: Medicare Other

## 2010-12-30 DIAGNOSIS — Z23 Encounter for immunization: Secondary | ICD-10-CM

## 2010-12-31 ENCOUNTER — Other Ambulatory Visit: Payer: Self-pay | Admitting: Family Medicine

## 2010-12-31 ENCOUNTER — Other Ambulatory Visit (INDEPENDENT_AMBULATORY_CARE_PROVIDER_SITE_OTHER): Payer: Medicare Other

## 2010-12-31 DIAGNOSIS — E7800 Pure hypercholesterolemia, unspecified: Secondary | ICD-10-CM

## 2010-12-31 LAB — LIPID PANEL
Total CHOL/HDL Ratio: 4
Triglycerides: 169 mg/dL — ABNORMAL HIGH (ref 0.0–149.0)

## 2010-12-31 LAB — LDL CHOLESTEROL, DIRECT: Direct LDL: 126.9 mg/dL

## 2011-03-03 ENCOUNTER — Telehealth: Payer: Self-pay

## 2011-03-03 NOTE — Telephone Encounter (Signed)
Pt left v/m on triage phone that pts grandson who is 77 y.o. But did not leave grandson's name is sick with 101 fever, aching all over and stomach pain. Mr Karnes asked what could grandson take beside Advil. I tried to call pt back at contact # left [phone] and left v/m for pt to call back. I will forward to Dr Lianne Bushy nurse.

## 2011-03-04 NOTE — Telephone Encounter (Signed)
LMOVM to return call.

## 2011-05-05 ENCOUNTER — Telehealth: Payer: Self-pay | Admitting: Family Medicine

## 2011-05-05 ENCOUNTER — Encounter: Payer: Self-pay | Admitting: Family Medicine

## 2011-05-05 NOTE — Telephone Encounter (Signed)
Triage Record Num: [id number] Operator: Di Kindle Patient Name: Larry [name] Call Date & Time: 05/05/2011 10:37:59AM Patient Phone: [phone] PCP: Crawford Givens Patient Gender: Male PCP Fax : Patient DOB: May 24, 1929 Practice Name: [facility] Reason for Call: Caller: Eoin/Patient; PCP: Crawford Givens Clelia Croft); CB#: [phone]; Call regarding Having Green and Bloody Nasal Drainage; onset ~ 2 weeks, afebrile. Symptoms reviewed URI, with 2+ weeks of symptoms, Appt 0815 05/06/11 with Dr Milinda Antis. Protocol(s) Used: Upper Respiratory Infection (URI) Recommended Outcome per Protocol: See Provider within 24 hours Reason for Outcome: Symptoms worsen after 7 days or symptoms do not improve after 14 days of home care Care Advice: ~ Use a cool mist humidifier to moisten air. Be sure to clean according to manufacturer's instructions. ~ Rest until symptoms improve. ~ Consider use of a saline nasal spray per package directions to help relieve nasal congestion. ~ Warm fluids may help, or try a mixture of honey and lemon juice in warm tea. Coughing up mucus or phlegm helps to get rid of an infection. A productive cough should not be stopped. A cough medicine with guaifenesin (Robitussin, Mucinex) can help loosen the mucus. Cough medicine with dextromethorphan (DM) should be avoided. Drinking lots of fluids can help loosen the mucus too, especially warm fluids. ~ Most adults need to drink 6-10 eight-ounce glasses (1.2-2.0 liters) of fluids per day unless previously told to limit fluid intake for other medical reasons. Limit fluids that contain caffeine, sugar or alcohol. Urine will be a very light yellow color when you drink enough fluids. ~ Speak with your provider as soon as possible if: - any temperature elevation in a frail elderly or immunocompromised patient (such as diabetes, HIV/AIDS, renal disease, chemotherapy, organ transplant, or chronic steroid use). - pregnant and  temperature elevation of 100.5 F (38C) or above. - fever does not respond despite 2 doses of fever reducing medication. - fever responds to home care but persists for 3 days or more. ~ 05/05/2011 10:51:43AM Page 1 of 1 [id number]

## 2011-05-05 NOTE — Telephone Encounter (Signed)
Appt 1610 05/06/11 with Dr Milinda Antis.

## 2011-05-06 ENCOUNTER — Ambulatory Visit (INDEPENDENT_AMBULATORY_CARE_PROVIDER_SITE_OTHER): Payer: Medicare Other | Admitting: Family Medicine

## 2011-05-06 ENCOUNTER — Encounter: Payer: Self-pay | Admitting: Family Medicine

## 2011-05-06 VITALS — BP 142/78 | HR 76 | Temp 98.2°F | Ht 64.75 in | Wt 151.8 lb

## 2011-05-06 DIAGNOSIS — J019 Acute sinusitis, unspecified: Secondary | ICD-10-CM

## 2011-05-06 DIAGNOSIS — R040 Epistaxis: Secondary | ICD-10-CM

## 2011-05-06 MED ORDER — AMOXICILLIN-POT CLAVULANATE 875-125 MG PO TABS: 1.0000 | ORAL_TABLET | Freq: Two times a day (BID) | ORAL | Status: AC

## 2011-05-06 NOTE — Progress Notes (Signed)
Subjective:    Patient ID: Larry [name]., male    DOB: 1929/09/01, 78 y.o.   MRN: [id number]  HPI Symptoms started 2 weeks ago -- and just not getting better  Sinus drainage and bad head congestion  Feels a little off balance Ears feel ok  Throat is dry - not painful   Nosebleed this am - both nostrils (one at a time) It lasted 15 min- not constant flow  He has had nosebleeds in distant past Required cauterization once This is not nearly as bad  bp is ok  No dizziness   No sinus pain   No cough / does clear throat from drainage  Bloody yellow nasal disch  No fever   Patient Active Problem List  Diagnoses  . HYPOTHYROIDISM  . HYPERLIPIDEMIA  . UNSPECIFIED THROMBOCYTOPENIA  . HEMORRHOIDS  . GERD  . BLOOD IN STOOL  . OTHER&UNSPEC DISC DISORDER UNSPEC REGION  . LOW BACK PAIN  . UNSPECIFIED MYALGIA AND MYOSITIS  . HEADACHE  . CHICKENPOX, HX OF  . ESSENTIAL HYPERTENSION, BENIGN  . RHINITIS  . Tick bite  . Acute sinusitis with symptoms > 10 days  . Epistaxis   Past Medical History  Diagnosis Date  . Myalgia and myositis, unspecified   . Other and unspecified disc disorder of unspecified region   . Lumbago   . Thrombocytopenia, unspecified   . Unspecified hypothyroidism   . Family history of diabetes mellitus   . History of chickenpox   . Other and unspecified hyperlipidemia   . Headache   . Esophageal reflux    Past Surgical History  Procedure Date  . Appendectomy 1949   History  Substance Use Topics  . Smoking status: Former Smoker    Types: Cigarettes  . Smokeless tobacco: Not on file  . Alcohol Use: Yes     Wine, rarely   Family History  Problem Relation Age of Onset  . Stroke Mother   . Diabetes Mother     borderline  . Stroke Father     <60  . Hypertension Sister   . Hyperlipidemia Sister   . Alzheimer's disease Brother   . Dementia Brother   . Arthritis Other   . Diabetes Other     1st degree relative  . Hyperlipidemia Other     . Hypertension Other   . Cancer Sister 26    colon   Allergies  Allergen Reactions  . Atorvastatin     REACTION: Intolerance  . Ezetimibe     REACTION: Intolerance  . Simvastatin     REACTION: Intolerance  . Sulfonamide Derivatives     REACTION: hives, itching   Current Outpatient Prescriptions on File Prior to Visit  Medication Sig Dispense Refill  . aspirin 81 MG tablet Take 81 mg by mouth daily.        Marland Kitchen co-enzyme Q-10 30 MG capsule Take 100 mg by mouth daily.        . fish oil-omega-3 fatty acids 1000 MG capsule Take 1,200 mg by mouth daily.        . fluticasone (FLONASE) 50 MCG/ACT nasal spray Place 2 sprays into the nose daily.      . folic acid (FOLVITE) 400 MCG tablet Take 400 mcg by mouth daily.        . Multiple Vitamin (MULTIVITAMIN) tablet Take 1 tablet by mouth daily.        . niacin 500 MG tablet Take 500 mg by mouth daily with breakfast.        .  NON FORMULARY Mega Red once daily.        . Red Yeast Rice Extract (RED YEAST RICE PO) Take 1,200 mg by mouth daily.      Marland Kitchen SYNTHROID 50 MCG tablet TAKE 1 TABLET EVERY MORNING ON AN EMPTY STOMACH ONCE A DAY ORALLY  90 tablet  3      Review of Systems Review of Systems  Constitutional: Negative for fever, appetite change, and unexpected weight change.pos for some fatigue  Eyes: Negative for pain and visual disturbance.  ENT pos for cong and rhinorrhea and nosebleed, neg for sinus pain or ST Respiratory: Negative for cough and shortness of breath.   Cardiovascular: Negative for cp or palpitations    Gastrointestinal: Negative for nausea, diarrhea and constipation.  Genitourinary: Negative for urgency and frequency.  Skin: Negative for pallor or rash   Neurological: Negative for weakness, light-headedness, numbness and headaches.  Hematological: Negative for adenopathy. Does not bruise/bleed easily.  Psychiatric/Behavioral: Negative for dysphoric mood. The patient is not nervous/anxious.          Objective:    Physical Exam  Constitutional: He appears well-developed and well-nourished. No distress.  HENT:  Head: Normocephalic and atraumatic.  Right Ear: External ear normal.  Left Ear: External ear normal.  Mouth/Throat: Oropharynx is clear and moist. No oropharyngeal exudate.       Nares are injected and congested  Scab on R septum -anterior  Some old blood No sinus tenderness Throat clear   Eyes: Conjunctivae and EOM are normal. Pupils are equal, round, and reactive to light. Right eye exhibits no discharge. Left eye exhibits no discharge.  Neck: Normal range of motion. Neck supple. No JVD present. Carotid bruit is not present. No thyromegaly present.  Cardiovascular: Normal rate, regular rhythm and normal heart sounds.   Pulmonary/Chest: Effort normal and breath sounds normal. No respiratory distress. He has no wheezes.  Lymphadenopathy:    He has no cervical adenopathy.  Neurological: He is alert. No cranial nerve deficit.  Skin: Skin is warm and dry. No rash noted. No erythema. No pallor.  Psychiatric: He has a normal mood and affect.          Assessment & Plan:

## 2011-05-06 NOTE — Assessment & Plan Note (Signed)
Episode today after 2 wk of a cold  Has had before with cauterization  Disc what to do in case of nosebleed and given handout See AVS Update if not starting to improve in a week or if worsening  (recurrent)

## 2011-05-06 NOTE — Assessment & Plan Note (Signed)
14 d of symptoms - however not a perfect picture of sinusitis - but it may be beginning  Did write and print px for augmentin to hold Disc red flags - sinus pain /fever - will see how he does for next 3 days

## 2011-05-06 NOTE — Patient Instructions (Addendum)
You may be starting a sinus infection If you do not improve further in the next 3 days - or if more congestion or sinus pain - fill augmentin and take as directed Continue nasal saline  Use vaseline in nostrils  Pinch firmly for 10 minutes for nosebleed - if it does not stop in 30 minutes let us know Update if not starting to improve in a week or if worsening

## 2011-06-02 ENCOUNTER — Other Ambulatory Visit: Payer: Self-pay | Admitting: *Deleted

## 2011-06-02 MED ORDER — LEVOTHYROXINE SODIUM 50 MCG PO TABS: 50.0000 ug | ORAL_TABLET | Freq: Every day | ORAL | Status: DC

## 2011-07-26 ENCOUNTER — Telehealth: Payer: Self-pay | Admitting: Family Medicine

## 2011-07-26 NOTE — Telephone Encounter (Signed)
Patient is calling because he's disputing his bill for 05/06/11.  He said he was billed  $195 for a 25 minute visit.  Patient said he wasn't with Dr.Tower for that long.  Patient said when he came in he was feeling better.  He called to make the appointment on Friday and he wasn't able to make the appointment until Monday @ 8:15 and he was afraid to call and cancel because of the late fee.

## 2011-07-28 NOTE — Telephone Encounter (Signed)
Called Mr. Roedl and left a voice mail asking him to return my call.

## 2011-07-28 NOTE — Telephone Encounter (Signed)
Patient did not understand why Medicare did not pay.  Explained that the charges are based on documentation and he said he told the doctor that he felt fine but he did not cancel because he did not have time for a 24 hr notice.

## 2011-07-29 ENCOUNTER — Telehealth: Payer: Self-pay | Admitting: Family Medicine

## 2011-07-29 NOTE — Telephone Encounter (Signed)
Noted  

## 2011-07-29 NOTE — Telephone Encounter (Signed)
Caller: Haddon/Mother; PCP: Crawford Givens Clelia Croft); CB#: [phone]; ; ; Call regarding Has Been Working in the [employer] But Is Better Now Nervious;  Pt calling regarding having a dizzy spell after getting off of mower this am, 5/10. States he got off of mower, bent over and had to sit back down because he felt like he was going to pass out. Feels fine now. Denies being overheated. Episode only lasted a minute or so. Pt states allergies have been bad lately. Pt also has had 2 seed tick bites in the last month but states they are gone, denies fever, rash or any health changes. Disp: Home care advice per Dizziness or Vertigo protocol. Home care advice given. Also advised pt to take BP on home monitor just to have a baseline. Pt will call back with any further or any chx of sxs.

## 2011-07-31 ENCOUNTER — Other Ambulatory Visit: Payer: Self-pay | Admitting: Family Medicine

## 2011-07-31 DIAGNOSIS — E039 Hypothyroidism, unspecified: Secondary | ICD-10-CM

## 2011-07-31 DIAGNOSIS — E7800 Pure hypercholesterolemia, unspecified: Secondary | ICD-10-CM

## 2011-08-04 ENCOUNTER — Other Ambulatory Visit (INDEPENDENT_AMBULATORY_CARE_PROVIDER_SITE_OTHER): Payer: Medicare Other

## 2011-08-04 DIAGNOSIS — E7800 Pure hypercholesterolemia, unspecified: Secondary | ICD-10-CM

## 2011-08-04 DIAGNOSIS — E039 Hypothyroidism, unspecified: Secondary | ICD-10-CM | POA: Diagnosis not present

## 2011-08-04 LAB — COMPREHENSIVE METABOLIC PANEL
ALT: 26 U/L (ref 0–53)
Alkaline Phosphatase: 51 U/L (ref 39–117)
CO2: 27 mEq/L (ref 19–32)
Creatinine, Ser: 0.7 mg/dL (ref 0.4–1.5)
GFR: 109.89 mL/min (ref >60.00)
Sodium: 139 mEq/L (ref 135–145)
Total Bilirubin: 0.5 mg/dL (ref 0.3–1.2)
Total Protein: 6.6 g/dL (ref 6.0–8.3)

## 2011-08-04 LAB — LIPID PANEL
Cholesterol: 209 mg/dL — ABNORMAL HIGH (ref 0–200)
HDL: 48.9 mg/dL (ref >39.00)
Total CHOL/HDL Ratio: 4
Triglycerides: 322 mg/dL — ABNORMAL HIGH (ref 0.0–149.0)

## 2011-08-04 LAB — TSH: TSH: 2.75 u[IU]/mL (ref 0.35–5.50)

## 2011-08-04 NOTE — Telephone Encounter (Signed)
Sent to [employer] for review.

## 2011-08-08 ENCOUNTER — Other Ambulatory Visit: Payer: Self-pay | Admitting: *Deleted

## 2011-08-08 MED ORDER — LEVOTHYROXINE SODIUM 50 MCG PO TABS: 50.0000 ug | ORAL_TABLET | Freq: Every day | ORAL | Status: DC

## 2011-08-08 NOTE — Telephone Encounter (Deleted)
Patient's last CPE was 1/12.  Has had acute visits since then.  Please advise.

## 2011-08-12 ENCOUNTER — Encounter: Payer: Self-pay | Admitting: Family Medicine

## 2011-08-12 ENCOUNTER — Ambulatory Visit (INDEPENDENT_AMBULATORY_CARE_PROVIDER_SITE_OTHER): Payer: Medicare Other | Admitting: Family Medicine

## 2011-08-12 VITALS — BP 126/80 | HR 66 | Temp 98.1°F | Ht 65.0 in | Wt 152.0 lb

## 2011-08-12 DIAGNOSIS — R42 Dizziness and giddiness: Secondary | ICD-10-CM

## 2011-08-12 DIAGNOSIS — E039 Hypothyroidism, unspecified: Secondary | ICD-10-CM

## 2011-08-12 DIAGNOSIS — Z0000 Encounter for general adult medical examination without abnormal findings: Secondary | ICD-10-CM

## 2011-08-12 DIAGNOSIS — R3989 Other symptoms and signs involving the genitourinary system: Secondary | ICD-10-CM | POA: Diagnosis not present

## 2011-08-12 DIAGNOSIS — N400 Enlarged prostate without lower urinary tract symptoms: Secondary | ICD-10-CM

## 2011-08-12 DIAGNOSIS — E785 Hyperlipidemia, unspecified: Secondary | ICD-10-CM | POA: Diagnosis not present

## 2011-08-12 DIAGNOSIS — R399 Unspecified symptoms and signs involving the genitourinary system: Secondary | ICD-10-CM

## 2011-08-12 MED ORDER — LEVOTHYROXINE SODIUM 50 MCG PO TABS: 50.0000 ug | ORAL_TABLET | Freq: Every day | ORAL | Status: DC

## 2011-08-12 NOTE — Progress Notes (Signed)
Patient has appt to see Ophthalmologist in the near future.  Ninetta Lights, CMA  I have personally reviewed the Medicare Annual Wellness questionnaire and have noted 1. The patient's medical and social history 2. Their use of alcohol, tobacco or illicit drugs 3. Their current medications and supplements 4. The patient's functional ability including ADL's, fall risks, home safety risks and hearing or visual             impairment. 5. Diet and physical activities 6. Evidence for depression or mood disorders  The patients weight, height, BMI have been recorded in the chart and visual acuity is per eye clinic.   I have made referrals, counseling and provided education to the patient based review of the above and I have provided the pt with a written personalized care plan for preventive services.  He has a living will.  He'll check this to see if it needs to be updated.   Flu done 2012 Shingles 2009 PNA 2003 Tetanus 2004 Colon 2010 See scanned forms .  Hypothyroid.  No ADE, no neck mass, no dysphagia.  TSH wnl.    Episode of vertigo.  Was on lawnmower, then got off to work on it.  He was on the ground, stooped over, was getting up and got dizzy.  The ground was spinning.  Isolated episode.  Quick onset, resolution.  No return of sx.  No CP.  Happened 2 weeks ago. No change in activity in meantime.  He can't take antihistamines.    H/o BPH with dec in stream. PSA prev wnl at 1.77.  He just started taking saw palmetto.  We agreed to check DRE today and check PSA on the way out.    Elevated TG.  Statin intolerant.   D/w pt re: labs and diet/exericse.   PMH and SH reviewed  ROS: See HPI, otherwise noncontributory.  Meds, vitals, and allergies reviewed.   CPE- See plan.  Routine anticipatory guidance given to patient.  See health maintenance.  PMH and SH reviewed  Meds, vitals, and allergies reviewed.   ROS: See HPI.  Otherwise negative.    GEN: nad, alert and oriented HEENT: mucous  membranes moist, tm wnl, OP wnl NECK: supple w/o LA, no TMG CV: rrr. PULM: ctab, no inc wob ABD: soft, +bs EXT: no edema SKIN: no acute rash CN 2-12 wnl B, S/S/DTR wnl x4 Prostate gland firm and smooth, mild symmetric enlargement, but no nodularity, tenderness, mass, asymmetry or induration.

## 2011-08-12 NOTE — Patient Instructions (Addendum)
We'll contact you with your lab report. Check on your living will.   I would get a flu shot each fall.   Let me know if you have more vertigo.

## 2011-08-13 LAB — PSA, MEDICARE: PSA: 2.13 ng/mL (ref <=4.00)

## 2011-08-15 DIAGNOSIS — R42 Dizziness and giddiness: Secondary | ICD-10-CM | POA: Insufficient documentation

## 2011-08-15 DIAGNOSIS — Z0000 Encounter for general adult medical examination without abnormal findings: Secondary | ICD-10-CM | POA: Insufficient documentation

## 2011-08-15 DIAGNOSIS — N400 Enlarged prostate without lower urinary tract symptoms: Secondary | ICD-10-CM | POA: Insufficient documentation

## 2011-08-15 NOTE — Assessment & Plan Note (Signed)
PSA reassuring, continue as if for now with saw palmetto and f/u prn.

## 2011-08-15 NOTE — Assessment & Plan Note (Signed)
Likely BPV, now resolved,  Normal neuro exam and DHP neg today on exam.  Will f/u prn.  Path/phys d/w pt.

## 2011-08-15 NOTE — Assessment & Plan Note (Signed)
Statin intolerant. D/w pt re: labs and diet/exericse.

## 2011-08-15 NOTE — Assessment & Plan Note (Signed)
TSH wnl, continue current meds.  Doing well.

## 2011-08-24 DIAGNOSIS — H2510 Age-related nuclear cataract, unspecified eye: Secondary | ICD-10-CM | POA: Diagnosis not present

## 2011-08-26 NOTE — Telephone Encounter (Signed)
Spoke to patient and explained that we were following up on his account.  It was forwarded to A. Trisha Mangle for review.

## 2011-08-26 NOTE — Telephone Encounter (Signed)
Called and LVM explaining that we were working on his account with billing and that it had appeared that it was still in the billing process and that it should be paid by insurance/[employer].  I asked if he would return my call to let me know if anyone from billing had contacted him with information and also if he had received further statements or bills.

## 2011-10-27 ENCOUNTER — Telehealth: Payer: Self-pay

## 2011-10-27 NOTE — Telephone Encounter (Signed)
I don't think he has to stop it.  If he's worried, then he can stop it.  It isn't mandatory.  Thanks.

## 2011-10-27 NOTE — Telephone Encounter (Signed)
Pt concerned about study that fish oil increases risk of prostate CA. Pt takes fish oil 1200 mg daily and krill oil 500 mg daily. Pt wants Dr Lianne Bushy opinion.Please advise.

## 2011-10-27 NOTE — Telephone Encounter (Signed)
Patient's wife advised

## 2011-12-02 ENCOUNTER — Ambulatory Visit (INDEPENDENT_AMBULATORY_CARE_PROVIDER_SITE_OTHER): Payer: Medicare Other

## 2011-12-02 DIAGNOSIS — Z23 Encounter for immunization: Secondary | ICD-10-CM | POA: Diagnosis not present

## 2011-12-20 DIAGNOSIS — L570 Actinic keratosis: Secondary | ICD-10-CM | POA: Diagnosis not present

## 2011-12-20 DIAGNOSIS — Z85828 Personal history of other malignant neoplasm of skin: Secondary | ICD-10-CM | POA: Diagnosis not present

## 2012-01-25 ENCOUNTER — Ambulatory Visit (INDEPENDENT_AMBULATORY_CARE_PROVIDER_SITE_OTHER)
Admission: RE | Admit: 2012-01-25 | Discharge: 2012-01-25 | Disposition: A | Discharge status: Home / Self Care | Payer: Medicare Other | Source: Ambulatory Visit | Attending: Family Medicine | Admitting: Family Medicine

## 2012-01-25 ENCOUNTER — Encounter: Payer: Self-pay | Admitting: Family Medicine

## 2012-01-25 ENCOUNTER — Ambulatory Visit (INDEPENDENT_AMBULATORY_CARE_PROVIDER_SITE_OTHER): Payer: Medicare Other | Admitting: Family Medicine

## 2012-01-25 VITALS — BP 112/70 | HR 68 | Temp 98.4°F | Wt 151.5 lb

## 2012-01-25 DIAGNOSIS — M25519 Pain in unspecified shoulder: Secondary | ICD-10-CM

## 2012-01-25 DIAGNOSIS — F418 Other specified anxiety disorders: Secondary | ICD-10-CM

## 2012-01-25 DIAGNOSIS — M19029 Primary osteoarthritis, unspecified elbow: Secondary | ICD-10-CM | POA: Diagnosis not present

## 2012-01-25 DIAGNOSIS — F411 Generalized anxiety disorder: Secondary | ICD-10-CM

## 2012-01-25 MED ORDER — ALPRAZOLAM 0.5 MG PO TABS: 0.5000 mg | ORAL_TABLET | Freq: Every day | ORAL | Status: DC | PRN

## 2012-01-25 MED ORDER — HYDROCODONE-ACETAMINOPHEN 5-325 MG PO TABS: 0.5000 | ORAL_TABLET | Freq: Four times a day (QID) | ORAL | Status: DC | PRN

## 2012-01-25 NOTE — Assessment & Plan Note (Signed)
Very rare BZD use, no ADE, continue prn use, ie invasive dental work, MRIs, etc.

## 2012-01-25 NOTE — Patient Instructions (Signed)
Take 2 ibuprofen at a time with food, 3-4 times a day.  Take vicodin as needed for pain, sedation caution.  Use the exercises we talked about.  See Shirlee Limerick about your referral before you leave today.Marland Kitchen

## 2012-01-25 NOTE — Progress Notes (Signed)
H/o situational anxiety- root canals, plane flight, MRI and would occ take xanax w/o ADE.  Needs refill.   R shoulder.  2 days ago.  Was getting leaves up, into a trailer.  The tailgate on the trailer came down and clipped his shoulder.  He isn't bruised but it is sore.  He was abducting his R shoulder to catch/support the gate on the way down.  The was worse last night and then today.  Taking advil didn't help much.  Pain with abduction now.  He didn't hear a pop or feel a tear.  No L sided sx.  Meds, vitals, and allergies reviewed.   ROS: See HPI.  Otherwise, noncontributory.  nad but pain with R shoulder ROM Neck supple, normal ROM rrr ctab R shoulder with pain on AROM, abduction.  Less pain with PROM/abduction. Can't actively abduct >90 deg.  Pain with ext>int rotation GH joint doesn't feel lax and proximal upper arm isn't ttp No bruising Distally nv intact

## 2012-01-25 NOTE — Assessment & Plan Note (Signed)
Concern for cuff tear.  D/w pt about pendulum exercises to prevent frozen shoulder.  Refer to ortho.  No fx on xrays, reviewed films. Use vicodin for pain, sedation and constipation caution. He understood.  >25 min spent with face to face with patient, >50% counseling and/or coordinating care D/w pt about rotator cuff anatomy and pathology.

## 2012-02-10 DIAGNOSIS — IMO0002 Reserved for concepts with insufficient information to code with codable children: Secondary | ICD-10-CM | POA: Diagnosis not present

## 2012-02-14 DIAGNOSIS — S40019A Contusion of unspecified shoulder, initial encounter: Secondary | ICD-10-CM | POA: Diagnosis not present

## 2012-02-29 DIAGNOSIS — M719 Bursopathy, unspecified: Secondary | ICD-10-CM | POA: Diagnosis not present

## 2012-02-29 DIAGNOSIS — M67919 Unspecified disorder of synovium and tendon, unspecified shoulder: Secondary | ICD-10-CM | POA: Diagnosis not present

## 2012-04-03 DIAGNOSIS — IMO0002 Reserved for concepts with insufficient information to code with codable children: Secondary | ICD-10-CM | POA: Diagnosis not present

## 2012-04-23 ENCOUNTER — Ambulatory Visit (INDEPENDENT_AMBULATORY_CARE_PROVIDER_SITE_OTHER): Payer: Medicare Other | Admitting: Family Medicine

## 2012-04-23 ENCOUNTER — Encounter: Payer: Self-pay | Admitting: Family Medicine

## 2012-04-23 VITALS — BP 122/84 | HR 75 | Temp 98.3°F | Wt 160.0 lb

## 2012-04-23 DIAGNOSIS — R21 Rash and other nonspecific skin eruption: Secondary | ICD-10-CM | POA: Diagnosis not present

## 2012-04-23 MED ORDER — PREDNISONE 10 MG PO TABS: ORAL_TABLET | ORAL | Status: DC

## 2012-04-23 NOTE — Progress Notes (Signed)
Rash.  B chest and back.  He's been eating more nuts/fruits in his cereal at breakfast.  He's cut that out over the last few days w/o much change.  He cut out his supplements recently.  He is intolerant of oral antihistamines.  Rash is itchy.  He changed soaps about 1 month ago, but it wasn't clear if this was related.  Rash present for about 2 weeks.  He's never been on oral steroids to his recollection.  No rash on hands or arms.  No FCNAV.    His R shoulder is better and he isn't needing pain meds currently.   Meds, vitals, and allergies reviewed.   ROS: See HPI.  Otherwise, noncontributory.  nad ncat Neck supple, no LA rrr ctab No oral or palmar lesions Chest and back with faint red blanching macules w/o vesicles or secondary change.  Bilateral, w/o dermatomal distribution

## 2012-04-23 NOTE — Patient Instructions (Addendum)
Take the prednisone with food and this should improve.  If not, call me.  Take care.

## 2012-04-24 DIAGNOSIS — R21 Rash and other nonspecific skin eruption: Secondary | ICD-10-CM | POA: Insufficient documentation

## 2012-04-24 NOTE — Assessment & Plan Note (Signed)
Unclear source, would not use antihistamines given his history.  Would use pred taper with GI cautions, routine steroids cautions.  F/u prn.  He agrees.

## 2012-04-30 DIAGNOSIS — L570 Actinic keratosis: Secondary | ICD-10-CM | POA: Diagnosis not present

## 2012-04-30 DIAGNOSIS — R21 Rash and other nonspecific skin eruption: Secondary | ICD-10-CM | POA: Diagnosis not present

## 2012-04-30 DIAGNOSIS — L821 Other seborrheic keratosis: Secondary | ICD-10-CM | POA: Diagnosis not present

## 2012-04-30 DIAGNOSIS — Z85828 Personal history of other malignant neoplasm of skin: Secondary | ICD-10-CM | POA: Diagnosis not present

## 2012-05-17 DIAGNOSIS — R21 Rash and other nonspecific skin eruption: Secondary | ICD-10-CM | POA: Diagnosis not present

## 2012-05-17 DIAGNOSIS — Z85828 Personal history of other malignant neoplasm of skin: Secondary | ICD-10-CM | POA: Diagnosis not present

## 2012-06-28 DIAGNOSIS — L259 Unspecified contact dermatitis, unspecified cause: Secondary | ICD-10-CM | POA: Diagnosis not present

## 2012-06-28 DIAGNOSIS — Z85828 Personal history of other malignant neoplasm of skin: Secondary | ICD-10-CM | POA: Diagnosis not present

## 2012-06-28 DIAGNOSIS — L820 Inflamed seborrheic keratosis: Secondary | ICD-10-CM | POA: Diagnosis not present

## 2012-07-03 ENCOUNTER — Encounter: Payer: Self-pay | Admitting: Family Medicine

## 2012-07-03 ENCOUNTER — Ambulatory Visit (INDEPENDENT_AMBULATORY_CARE_PROVIDER_SITE_OTHER): Payer: Medicare Other | Admitting: Family Medicine

## 2012-07-03 VITALS — BP 126/76 | HR 73 | Temp 97.9°F | Wt 156.5 lb

## 2012-07-03 DIAGNOSIS — R21 Rash and other nonspecific skin eruption: Secondary | ICD-10-CM

## 2012-07-03 NOTE — Progress Notes (Signed)
Still with rash, itchy, on chest and back.  Had a trial of prednisone and then mult steroids creams (including clobetasol per Dr. Karlyn Agee with derm), w/o much relief. He feels well o/w.    Meds, vitals, and allergies reviewed.   ROS: See HPI.  Otherwise, noncontributory.  nad ncat Mmm rrr ctab Trunk with diffuse, papular, blanching red lesions in nondermatomal distribution w/o ulceration.

## 2012-07-03 NOTE — Patient Instructions (Addendum)
Let me talk to derm and we'll be in touch.

## 2012-07-05 ENCOUNTER — Telehealth: Payer: Self-pay | Admitting: Family Medicine

## 2012-07-05 NOTE — Telephone Encounter (Signed)
Please call pt. I talked with Dr. Yetta Barre. It would be reasonable to use the clobetasol, but only on the red papules and not anywhere else.  If he can then taper off the cream, he'll likely improve. If not improved, then Jones needs to see him back.  Thanks.

## 2012-07-05 NOTE — Assessment & Plan Note (Signed)
I called Dr. Yetta Barre. His concern was about steroid overuse.  Will notify pt about modifying his steroid application.  If not improved, he'll need to f/u with derm. I don't suspect this to be the manifestation of an internal process.

## 2012-07-05 NOTE — Telephone Encounter (Signed)
Patient advised and they have tried all of this and he is not better want to know what else they can do.Advised patient to call and make appt with dr Yetta Barre.

## 2012-07-08 NOTE — Telephone Encounter (Signed)
Agreed -

## 2012-08-15 ENCOUNTER — Other Ambulatory Visit: Payer: Self-pay | Admitting: Family Medicine

## 2012-08-15 MED ORDER — LEVOTHYROXINE SODIUM 50 MCG PO TABS: 50.0000 ug | ORAL_TABLET | Freq: Every day | ORAL | Status: DC

## 2012-08-20 DIAGNOSIS — L259 Unspecified contact dermatitis, unspecified cause: Secondary | ICD-10-CM | POA: Diagnosis not present

## 2012-08-20 DIAGNOSIS — L821 Other seborrheic keratosis: Secondary | ICD-10-CM | POA: Diagnosis not present

## 2012-08-20 DIAGNOSIS — L738 Other specified follicular disorders: Secondary | ICD-10-CM | POA: Diagnosis not present

## 2012-08-20 DIAGNOSIS — Z85828 Personal history of other malignant neoplasm of skin: Secondary | ICD-10-CM | POA: Diagnosis not present

## 2012-08-27 ENCOUNTER — Encounter: Payer: Self-pay | Admitting: Family Medicine

## 2012-08-27 ENCOUNTER — Ambulatory Visit (INDEPENDENT_AMBULATORY_CARE_PROVIDER_SITE_OTHER): Payer: Medicare Other | Admitting: Family Medicine

## 2012-08-27 VITALS — BP 120/80 | HR 67 | Temp 97.9°F | Wt 156.2 lb

## 2012-08-27 DIAGNOSIS — R21 Rash and other nonspecific skin eruption: Secondary | ICD-10-CM

## 2012-08-27 NOTE — Progress Notes (Signed)
Has stopped using the topical steroids for his rash.  His arms are some better in the meantime, but still itching.  His trunk is worse. He stopped his other meds, other than the synthroid, but that didn't make a difference. Diffusely itchy, especially if the scratches it. Using menthol, cetaphil, and aloe recently with some help.  No rash on the BLE.    The steroids didn't help and the rash precedes the steroids.  He has the rash on areas where he wasn't using steroid creams.    He can't take antihistamines, see intolerance/allergy list.   Meds, vitals, and allergies reviewed.   ROS: See HPI.  Otherwise, noncontributory.  nad ncat OP wnl Neck supple rrr ctab Blanching maculopapular rash on the trunk > arms.  Not on palms or in the mouth.

## 2012-08-27 NOTE — Patient Instructions (Signed)
Use the aloe for now.  Try to avoid scratching.   See Shirlee Limerick about your referral before you leave today.

## 2012-08-28 DIAGNOSIS — L309 Dermatitis, unspecified: Secondary | ICD-10-CM | POA: Insufficient documentation

## 2012-08-28 NOTE — Assessment & Plan Note (Signed)
Continue topical aloe for now, avoid antihistamine and steroids for now, and I would like allergy clinic input.  His sx predate and didn't improve with steroids.  He agrees.

## 2012-08-31 DIAGNOSIS — J309 Allergic rhinitis, unspecified: Secondary | ICD-10-CM | POA: Diagnosis not present

## 2012-08-31 DIAGNOSIS — R21 Rash and other nonspecific skin eruption: Secondary | ICD-10-CM | POA: Diagnosis not present

## 2012-09-03 ENCOUNTER — Telehealth: Payer: Self-pay | Admitting: Family Medicine

## 2012-09-03 NOTE — Telephone Encounter (Signed)
Caller: Larry [name]/Patient; Phone: [phone]; Reason for Call: Pt is scheduled for a physical and labwork on June 24 with Dr Roger Shelter.  Pt wants to know if he should come in this week to have labwork done before appt.  (Pt was seen last week by allergist and all test came back negative regarding rash pt has, allergist recommended to have physical done and yearly labwork).  Please call pt back to let him know if he should come this week or not for labs.

## 2012-09-04 ENCOUNTER — Encounter: Payer: Self-pay | Admitting: *Deleted

## 2012-09-04 ENCOUNTER — Other Ambulatory Visit: Payer: Self-pay | Admitting: Family Medicine

## 2012-09-04 DIAGNOSIS — E039 Hypothyroidism, unspecified: Secondary | ICD-10-CM

## 2012-09-04 DIAGNOSIS — D696 Thrombocytopenia, unspecified: Secondary | ICD-10-CM

## 2012-09-04 DIAGNOSIS — E7800 Pure hypercholesterolemia, unspecified: Secondary | ICD-10-CM

## 2012-09-04 NOTE — Telephone Encounter (Signed)
Patient notified as instructed by telephone. Patient states that he has the physical appointment scheduled with Dr. Para March and will come for the lab appointment.

## 2012-09-04 NOTE — Telephone Encounter (Signed)
I would keep the lab appointment for tomorrow.  It doesn't look like he needs a PSA.  Other orders are in.

## 2012-09-05 ENCOUNTER — Other Ambulatory Visit (INDEPENDENT_AMBULATORY_CARE_PROVIDER_SITE_OTHER): Payer: Medicare Other

## 2012-09-05 DIAGNOSIS — E7800 Pure hypercholesterolemia, unspecified: Secondary | ICD-10-CM

## 2012-09-05 DIAGNOSIS — E039 Hypothyroidism, unspecified: Secondary | ICD-10-CM | POA: Diagnosis not present

## 2012-09-05 DIAGNOSIS — D696 Thrombocytopenia, unspecified: Secondary | ICD-10-CM | POA: Diagnosis not present

## 2012-09-05 LAB — COMPREHENSIVE METABOLIC PANEL
ALT: 25 U/L (ref 0–53)
Albumin: 4 g/dL (ref 3.5–5.2)
CO2: 27 mEq/L (ref 19–32)
Calcium: 8.4 mg/dL (ref 8.4–10.5)
Chloride: 100 mEq/L (ref 96–112)
GFR: 86.07 mL/min (ref >60.00)
Glucose, Bld: 108 mg/dL — ABNORMAL HIGH (ref 70–99)
Potassium: 4 mEq/L (ref 3.5–5.1)
Sodium: 136 mEq/L (ref 135–145)
Total Bilirubin: 0.6 mg/dL (ref 0.3–1.2)
Total Protein: 6.8 g/dL (ref 6.0–8.3)

## 2012-09-05 LAB — CBC WITH DIFFERENTIAL/PLATELET
Basophils Relative: 1 % (ref 0.0–3.0)
Eosinophils Relative: 4.6 % (ref 0.0–5.0)
Hemoglobin: 15.7 g/dL (ref 13.0–17.0)
Lymphocytes Relative: 31.3 % (ref 12.0–46.0)
MCV: 94.3 fl (ref 78.0–100.0)
Monocytes Absolute: 0.4 10*3/uL (ref 0.1–1.0)
Neutro Abs: 2.8 10*3/uL (ref 1.4–7.7)
Neutrophils Relative %: 55.8 % (ref 43.0–77.0)
RBC: 4.8 Mil/uL (ref 4.22–5.81)
WBC: 5.1 10*3/uL (ref 4.5–10.5)

## 2012-09-05 LAB — LIPID PANEL
Cholesterol: 301 mg/dL — ABNORMAL HIGH (ref 0–200)
VLDL: 149 mg/dL — ABNORMAL HIGH (ref 0.0–40.0)

## 2012-09-11 ENCOUNTER — Encounter: Payer: Self-pay | Admitting: Family Medicine

## 2012-09-11 ENCOUNTER — Ambulatory Visit (INDEPENDENT_AMBULATORY_CARE_PROVIDER_SITE_OTHER): Payer: Medicare Other | Admitting: Family Medicine

## 2012-09-11 VITALS — BP 128/78 | HR 77 | Temp 97.7°F | Ht 64.75 in | Wt 156.5 lb

## 2012-09-11 DIAGNOSIS — E039 Hypothyroidism, unspecified: Secondary | ICD-10-CM | POA: Diagnosis not present

## 2012-09-11 DIAGNOSIS — E7800 Pure hypercholesterolemia, unspecified: Secondary | ICD-10-CM

## 2012-09-11 DIAGNOSIS — E785 Hyperlipidemia, unspecified: Secondary | ICD-10-CM

## 2012-09-11 DIAGNOSIS — R21 Rash and other nonspecific skin eruption: Secondary | ICD-10-CM | POA: Diagnosis not present

## 2012-09-11 DIAGNOSIS — Z0000 Encounter for general adult medical examination without abnormal findings: Secondary | ICD-10-CM

## 2012-09-11 NOTE — Patient Instructions (Addendum)
Try the red yeast rice every other day and get back on your diet.  I would get a flu shot each fall.   I'll send a note to the allergy clinic.  I would give them an update in the near future.   Take care.  Glad to see you.

## 2012-09-11 NOTE — Progress Notes (Signed)
I have personally reviewed the Medicare Annual Wellness questionnaire and have noted 1. The patient's medical and social history 2. Their use of alcohol, tobacco or illicit drugs 3. Their current medications and supplements 4. The patient's functional ability including ADL's, fall risks, home safety risks and hearing or visual             impairment. 5. Diet and physical activities 6. Evidence for depression or mood disorders  The patients weight, height, BMI have been recorded in the chart and visual acuity is per eye clinic.  I have made referrals, counseling and provided education to the patient based review of the above and I have provided the pt with a written personalized care plan for preventive services.  See scanned forms.  Routine anticipatory guidance given to patient.  See health maintenance. Flu 2013 Shingles 2009 PNA 2003 Tetanus2004 Colonoscopy 2010 Prostate cancer screening and PSA options (with potential risks and benefits of testing vs not testing) were discussed along with recent recs/guidelines.  He declined testing PSA at this point. Advance directive d/w pt.  Wife designated if incapacitated.   Cognitive function addressed- see scanned forms- and if abnormal then additional documentation follows.   Skin rash.  Had f/u with allergy clinic. He is some better now.  He may need f/u with derm/allergy; he'll call about that.   Hypothyroid.  TSH wnl, discussed. No neck mass, dysphagia.   HLD.  Statin intolerant.  Diet has been altered.  Prev was on red yeast rice.  Labs discussed.  PMH and SH reviewed  Meds, vitals, and allergies reviewed.   ROS: See HPI.  Otherwise negative.    GEN: nad, alert and oriented HEENT: mucous membranes moist NECK: supple w/o LA, no TMG on exam CV: rrr. PULM: ctab, no inc wob ABD: soft, +bs EXT: no edema SKIN: no acute rash, his prev skin irritation does appear to be improved on the trunk and arms.

## 2012-09-12 NOTE — Assessment & Plan Note (Signed)
TSH wnl, continue as is.  

## 2012-09-12 NOTE — Assessment & Plan Note (Signed)
He'll call about f/u with the allergy clinic.  His routine labs were unremarkable.

## 2012-09-12 NOTE — Assessment & Plan Note (Signed)
See scanned forms.  Routine anticipatory guidance given to patient.  See health maintenance. Flu 2013 Shingles 2009 PNA 2003 Tetanus2004 Colonoscopy 2010 Prostate cancer screening and PSA options (with potential risks and benefits of testing vs not testing) were discussed along with recent recs/guidelines.  He declined testing PSA at this point. Advance directive d/w pt.  Wife designated if incapacitated.   Cognitive function addressed- see scanned forms- and if abnormal then additional documentation follows.

## 2012-09-12 NOTE — Assessment & Plan Note (Signed)
He'll work on diet and restart red yeast rice when the derm issues are resolved. Recheck lipids later in 2014.

## 2012-09-19 DIAGNOSIS — H2510 Age-related nuclear cataract, unspecified eye: Secondary | ICD-10-CM | POA: Diagnosis not present

## 2012-10-01 DIAGNOSIS — J3089 Other allergic rhinitis: Secondary | ICD-10-CM | POA: Diagnosis not present

## 2012-10-01 DIAGNOSIS — R21 Rash and other nonspecific skin eruption: Secondary | ICD-10-CM | POA: Diagnosis not present

## 2012-10-03 DIAGNOSIS — J3089 Other allergic rhinitis: Secondary | ICD-10-CM | POA: Diagnosis not present

## 2012-10-03 DIAGNOSIS — R21 Rash and other nonspecific skin eruption: Secondary | ICD-10-CM | POA: Diagnosis not present

## 2012-10-05 DIAGNOSIS — R21 Rash and other nonspecific skin eruption: Secondary | ICD-10-CM | POA: Diagnosis not present

## 2012-10-05 DIAGNOSIS — J3089 Other allergic rhinitis: Secondary | ICD-10-CM | POA: Diagnosis not present

## 2012-10-29 DIAGNOSIS — L259 Unspecified contact dermatitis, unspecified cause: Secondary | ICD-10-CM | POA: Diagnosis not present

## 2012-10-29 DIAGNOSIS — B079 Viral wart, unspecified: Secondary | ICD-10-CM | POA: Diagnosis not present

## 2012-10-29 DIAGNOSIS — Z85828 Personal history of other malignant neoplasm of skin: Secondary | ICD-10-CM | POA: Diagnosis not present

## 2012-10-29 DIAGNOSIS — L570 Actinic keratosis: Secondary | ICD-10-CM | POA: Diagnosis not present

## 2012-10-29 DIAGNOSIS — L821 Other seborrheic keratosis: Secondary | ICD-10-CM | POA: Diagnosis not present

## 2012-10-29 DIAGNOSIS — D485 Neoplasm of uncertain behavior of skin: Secondary | ICD-10-CM | POA: Diagnosis not present

## 2012-10-31 DIAGNOSIS — J3089 Other allergic rhinitis: Secondary | ICD-10-CM | POA: Diagnosis not present

## 2012-10-31 DIAGNOSIS — R21 Rash and other nonspecific skin eruption: Secondary | ICD-10-CM | POA: Diagnosis not present

## 2012-12-07 DIAGNOSIS — L259 Unspecified contact dermatitis, unspecified cause: Secondary | ICD-10-CM | POA: Diagnosis not present

## 2012-12-12 ENCOUNTER — Other Ambulatory Visit: Payer: Medicare Other

## 2013-01-02 DIAGNOSIS — L259 Unspecified contact dermatitis, unspecified cause: Secondary | ICD-10-CM | POA: Diagnosis not present

## 2013-01-02 DIAGNOSIS — Z79899 Other long term (current) drug therapy: Secondary | ICD-10-CM | POA: Diagnosis not present

## 2013-01-08 ENCOUNTER — Ambulatory Visit (INDEPENDENT_AMBULATORY_CARE_PROVIDER_SITE_OTHER): Payer: Medicare Other

## 2013-01-08 DIAGNOSIS — Z23 Encounter for immunization: Secondary | ICD-10-CM | POA: Diagnosis not present

## 2013-01-16 DIAGNOSIS — Z79899 Other long term (current) drug therapy: Secondary | ICD-10-CM | POA: Diagnosis not present

## 2013-01-16 DIAGNOSIS — L259 Unspecified contact dermatitis, unspecified cause: Secondary | ICD-10-CM | POA: Diagnosis not present

## 2013-02-07 ENCOUNTER — Other Ambulatory Visit: Payer: Self-pay | Admitting: Family Medicine

## 2013-02-15 DIAGNOSIS — L259 Unspecified contact dermatitis, unspecified cause: Secondary | ICD-10-CM | POA: Diagnosis not present

## 2013-02-15 DIAGNOSIS — Z79899 Other long term (current) drug therapy: Secondary | ICD-10-CM | POA: Diagnosis not present

## 2013-02-22 ENCOUNTER — Other Ambulatory Visit: Payer: Self-pay | Admitting: Family Medicine

## 2013-03-06 ENCOUNTER — Inpatient Hospital Stay (HOSPITAL_COMMUNITY)
Admission: EM | Admit: 2013-03-06 | Discharge: 2013-03-08 | DRG: 394 | Disposition: A | Discharge status: Home / Self Care | Payer: Medicare Other | Attending: Internal Medicine | Admitting: Internal Medicine

## 2013-03-06 ENCOUNTER — Emergency Department (HOSPITAL_COMMUNITY)
Admission: EM | Admit: 2013-03-06 | Discharge: 2013-03-06 | Disposition: A | Discharge status: Home / Self Care | Payer: Medicare Other | Source: Home / Self Care | Attending: Emergency Medicine | Admitting: Emergency Medicine

## 2013-03-06 ENCOUNTER — Encounter (HOSPITAL_COMMUNITY): Payer: Self-pay | Admitting: Emergency Medicine

## 2013-03-06 DIAGNOSIS — Z860100 Personal history of colon polyps, unspecified: Secondary | ICD-10-CM | POA: Insufficient documentation

## 2013-03-06 DIAGNOSIS — R197 Diarrhea, unspecified: Secondary | ICD-10-CM

## 2013-03-06 DIAGNOSIS — Z820 Family history of epilepsy and other diseases of the nervous system: Secondary | ICD-10-CM | POA: Diagnosis not present

## 2013-03-06 DIAGNOSIS — R110 Nausea: Secondary | ICD-10-CM | POA: Insufficient documentation

## 2013-03-06 DIAGNOSIS — E039 Hypothyroidism, unspecified: Secondary | ICD-10-CM | POA: Insufficient documentation

## 2013-03-06 DIAGNOSIS — Z8249 Family history of ischemic heart disease and other diseases of the circulatory system: Secondary | ICD-10-CM | POA: Diagnosis not present

## 2013-03-06 DIAGNOSIS — K559 Vascular disorder of intestine, unspecified: Principal | ICD-10-CM | POA: Diagnosis present

## 2013-03-06 DIAGNOSIS — L259 Unspecified contact dermatitis, unspecified cause: Secondary | ICD-10-CM | POA: Diagnosis present

## 2013-03-06 DIAGNOSIS — N400 Enlarged prostate without lower urinary tract symptoms: Secondary | ICD-10-CM

## 2013-03-06 DIAGNOSIS — R5381 Other malaise: Secondary | ICD-10-CM | POA: Insufficient documentation

## 2013-03-06 DIAGNOSIS — A09 Infectious gastroenteritis and colitis, unspecified: Secondary | ICD-10-CM | POA: Diagnosis present

## 2013-03-06 DIAGNOSIS — K922 Gastrointestinal hemorrhage, unspecified: Secondary | ICD-10-CM | POA: Diagnosis not present

## 2013-03-06 DIAGNOSIS — K219 Gastro-esophageal reflux disease without esophagitis: Secondary | ICD-10-CM | POA: Insufficient documentation

## 2013-03-06 DIAGNOSIS — Z833 Family history of diabetes mellitus: Secondary | ICD-10-CM

## 2013-03-06 DIAGNOSIS — R51 Headache: Secondary | ICD-10-CM

## 2013-03-06 DIAGNOSIS — R42 Dizziness and giddiness: Secondary | ICD-10-CM

## 2013-03-06 DIAGNOSIS — F418 Other specified anxiety disorders: Secondary | ICD-10-CM

## 2013-03-06 DIAGNOSIS — K8020 Calculus of gallbladder without cholecystitis without obstruction: Secondary | ICD-10-CM | POA: Diagnosis not present

## 2013-03-06 DIAGNOSIS — Z79899 Other long term (current) drug therapy: Secondary | ICD-10-CM | POA: Insufficient documentation

## 2013-03-06 DIAGNOSIS — K5730 Diverticulosis of large intestine without perforation or abscess without bleeding: Secondary | ICD-10-CM | POA: Diagnosis present

## 2013-03-06 DIAGNOSIS — E781 Pure hyperglyceridemia: Secondary | ICD-10-CM | POA: Diagnosis present

## 2013-03-06 DIAGNOSIS — Z823 Family history of stroke: Secondary | ICD-10-CM | POA: Diagnosis not present

## 2013-03-06 DIAGNOSIS — E785 Hyperlipidemia, unspecified: Secondary | ICD-10-CM

## 2013-03-06 DIAGNOSIS — D696 Thrombocytopenia, unspecified: Secondary | ICD-10-CM | POA: Diagnosis present

## 2013-03-06 DIAGNOSIS — K5289 Other specified noninfective gastroenteritis and colitis: Secondary | ICD-10-CM | POA: Diagnosis not present

## 2013-03-06 DIAGNOSIS — R21 Rash and other nonspecific skin eruption: Secondary | ICD-10-CM

## 2013-03-06 DIAGNOSIS — Z0000 Encounter for general adult medical examination without abnormal findings: Secondary | ICD-10-CM

## 2013-03-06 DIAGNOSIS — K625 Hemorrhage of anus and rectum: Secondary | ICD-10-CM | POA: Insufficient documentation

## 2013-03-06 DIAGNOSIS — Z800 Family history of malignant neoplasm of digestive organs: Secondary | ICD-10-CM | POA: Diagnosis not present

## 2013-03-06 DIAGNOSIS — Z8619 Personal history of other infectious and parasitic diseases: Secondary | ICD-10-CM | POA: Insufficient documentation

## 2013-03-06 DIAGNOSIS — Z9889 Other specified postprocedural states: Secondary | ICD-10-CM | POA: Insufficient documentation

## 2013-03-06 DIAGNOSIS — IMO0002 Reserved for concepts with insufficient information to code with codable children: Secondary | ICD-10-CM

## 2013-03-06 DIAGNOSIS — Z862 Personal history of diseases of the blood and blood-forming organs and certain disorders involving the immune mechanism: Secondary | ICD-10-CM | POA: Insufficient documentation

## 2013-03-06 DIAGNOSIS — J310 Chronic rhinitis: Secondary | ICD-10-CM

## 2013-03-06 DIAGNOSIS — K649 Unspecified hemorrhoids: Secondary | ICD-10-CM

## 2013-03-06 DIAGNOSIS — M545 Low back pain: Secondary | ICD-10-CM

## 2013-03-06 LAB — CBC WITH DIFFERENTIAL/PLATELET
Basophils Absolute: 0 10*3/uL (ref 0.0–0.1)
Basophils Relative: 0 % (ref 0–1)
Eosinophils Relative: 0 % (ref 0–5)
HCT: 44.6 % (ref 39.0–52.0)
Hemoglobin: 15.8 g/dL (ref 13.0–17.0)
Lymphs Abs: 0.2 10*3/uL — ABNORMAL LOW (ref 0.7–4.0)
MCHC: 35.4 g/dL (ref 30.0–36.0)
MCV: 93.1 fL (ref 78.0–100.0)
Monocytes Absolute: 0.8 10*3/uL (ref 0.1–1.0)
Monocytes Relative: 7 % (ref 3–12)
Neutro Abs: 9.8 10*3/uL — ABNORMAL HIGH (ref 1.7–7.7)
RDW: 14.6 % (ref 11.5–15.5)

## 2013-03-06 LAB — COMPREHENSIVE METABOLIC PANEL
Albumin: 3.9 g/dL (ref 3.5–5.2)
Alkaline Phosphatase: 91 U/L (ref 39–117)
BUN: 20 mg/dL (ref 6–23)
CO2: 26 mEq/L (ref 19–32)
Calcium: 10 mg/dL (ref 8.4–10.5)
Chloride: 100 mEq/L (ref 96–112)
Creatinine, Ser: 0.75 mg/dL (ref 0.50–1.35)
GFR calc Af Amer: >90 mL/min (ref >90)
GFR calc non Af Amer: 84 mL/min — ABNORMAL LOW (ref >90)
Glucose, Bld: 118 mg/dL — ABNORMAL HIGH (ref 70–99)
Potassium: 4.7 mEq/L (ref 3.5–5.1)
Total Bilirubin: 0.3 mg/dL (ref 0.3–1.2)

## 2013-03-06 LAB — APTT: aPTT: 27 seconds (ref 24–37)

## 2013-03-06 LAB — PROTIME-INR
INR: 0.98 (ref 0.00–1.49)
Prothrombin Time: 12.8 seconds (ref 11.6–15.2)

## 2013-03-06 LAB — OCCULT BLOOD, POC DEVICE: Fecal Occult Bld: POSITIVE — AB

## 2013-03-06 MED ORDER — SODIUM CHLORIDE 0.9 % IV SOLN
INTRAVENOUS | Status: DC
  Administered 2013-03-07 (×2): via INTRAVENOUS

## 2013-03-06 MED ORDER — SODIUM CHLORIDE 0.9 % IV BOLUS (SEPSIS)
1000.0000 mL | Freq: Once | INTRAVENOUS | Status: AC
  Administered 2013-03-07: 1000 mL via INTRAVENOUS

## 2013-03-06 NOTE — ED Provider Notes (Signed)
CSN: [id number]     Arrival date & time 03/06/13  2116 History   First MD Initiated Contact with Patient 03/06/13 2340     Chief Complaint  Patient presents with  . Rectal Bleeding   (Consider location/radiation/quality/duration/timing/severity/associated sxs/prior Treatment) HPI This 79 year old male has about 18 hours of intermittent crampy lower abdominal pain with multiple loose maroon bloody stools with no lightheadedness no chest pain no shortness breath no nausea no vomiting no constant pain or localized pain, he has a history of thrombocytopenia and recently started methotrexate, this morning his platelet count was 125,000 and his hemoglobin was normal over 15 when he presented to the emergency department when his symptoms were just beginning, since he is at home today he has had several episodes of maroon bloody stool throughout the course of the day and evening so return to the emergency department for recheck. His symptoms are moderate and there is no treatment prior to arrival. Past Medical History  Diagnosis Date  . Thrombocytopenia, unspecified   . Unspecified hypothyroidism   . Family history of diabetes mellitus   . History of chickenpox   . Other and unspecified hyperlipidemia   . Esophageal reflux    Past Surgical History  Procedure Laterality Date  . Appendectomy  1949   Family History  Problem Relation Age of Onset  . Stroke Mother   . Diabetes Mother     borderline  . Pneumonia Father   . Hypertension Sister   . Hyperlipidemia Sister   . Alzheimer's disease Brother   . Dementia Brother   . Arthritis Other   . Diabetes Other     1st degree relative  . Hyperlipidemia Other   . Hypertension Other   . Cancer Sister 20    colon  . Colon cancer Sister   . Prostate cancer Neg Hx    History  Substance Use Topics  . Smoking status: Never Smoker   . Smokeless tobacco: Never Used     Comment: smoked only as a teenage small amount  . Alcohol Use: Yes   Comment: very rare    Review of Systems 10 Systems reviewed and are negative for acute change except as noted in the HPI. Allergies  Antihistamines, diphenhydramine-type; Atorvastatin; Ezetimibe; Simvastatin; and Sulfonamide derivatives  Home Medications   Current Outpatient Rx  Name  Route  Sig  Dispense  Refill  . ALPRAZolam (XANAX) 0.5 MG tablet   Oral   Take 0.5 mg by mouth at bedtime as needed for anxiety (For MRI or root canals).         . calcium carbonate (TUMS - DOSED IN MG ELEMENTAL CALCIUM) 500 MG chewable tablet   Oral   Chew 1 tablet by mouth daily as needed for indigestion or heartburn.         . Coenzyme Q10 (CO Q 10 PO)   Oral   Take 1 capsule by mouth daily.         Marland Kitchen KRILL OIL PO   Oral   Take 1 tablet by mouth daily.         Marland Kitchen levothyroxine (SYNTHROID, LEVOTHROID) 50 MCG tablet   Oral   Take 50 mcg by mouth daily before breakfast.         . methotrexate (RHEUMATREX) 2.5 MG tablet   Oral   Take 12.5 mg by mouth once a week. Takes every Thursday, tech states patient received 12/11 Caution:Chemotherapy. Protect from light.         Marland Kitchen  Multiple Vitamins-Minerals (MULTIVITAMIN WITH MINERALS) tablet   Oral   Take 1 tablet by mouth daily.         . Omega-3 Fatty Acids (FISH OIL PO)   Oral   Take 1 tablet by mouth daily.         . Red Yeast Rice Extract (RED YEAST RICE PO)   Oral   Take 1 tablet by mouth daily.         . ciprofloxacin (CIPRO) 250 MG tablet   Oral   Take 1 tablet (250 mg total) by mouth 2 (two) times daily. For 14 days   28 tablet   0   . metroNIDAZOLE (FLAGYL) 500 MG tablet   Oral   Take 1 tablet (500 mg total) by mouth 3 (three) times daily. For 14 days   42 tablet   0   . ondansetron (ZOFRAN) 4 MG tablet   Oral   Take 1 tablet (4 mg total) by mouth every 6 (six) hours as needed for nausea.   20 tablet   0    BP 134/75  Pulse 79  Temp(Src) 97.9 F (36.6 C) (Oral)  Resp 18  Ht 5\' 5"  (1.651 m)  Wt 148  lb 2.4 oz (67.2 kg)  BMI 24.65 kg/m2  SpO2 100% Physical Exam  Nursing note and vitals reviewed. Constitutional:  Awake, alert, nontoxic appearance.  HENT:  Head: Atraumatic.  Eyes: Right eye exhibits no discharge. Left eye exhibits no discharge.  Neck: Neck supple.  Cardiovascular: Normal rate and regular rhythm.   No murmur heard. Pulmonary/Chest: Effort normal and breath sounds normal. No respiratory distress. He has no wheezes. He has no rales. He exhibits no tenderness.  Abdominal: Soft. Bowel sounds are normal. He exhibits no distension and no mass. There is no tenderness. There is no rebound and no guarding.  Patient stool sample is loose grossly bloody maroon stool  Musculoskeletal: He exhibits no tenderness.  Baseline ROM, no obvious new focal weakness.  Neurological:  Mental status and motor strength appears baseline for patient and situation.  Skin: No rash noted.  Psychiatric: He has a normal mood and affect.    ED Course  Procedures (including critical care time) Pt stable in ED with no significant deterioration in condition.Patient / Family / Caregiver informed of clinical course, understand medical decision-making process, and agree with plan. D/w Med for admit. Labs Review Labs Reviewed  CBC - Abnormal; Notable for the following:    Platelets 121 (*)    All other components within normal limits  COMPREHENSIVE METABOLIC PANEL - Abnormal; Notable for the following:    Glucose, Bld 119 (*)    GFR calc non Af Amer 88 (*)    All other components within normal limits  CBC - Abnormal; Notable for the following:    RBC 4.05 (*)    HCT 37.9 (*)    Platelets 121 (*)    All other components within normal limits  CBC - Abnormal; Notable for the following:    RBC 4.17 (*)    Platelets 118 (*)    All other components within normal limits  CBC - Abnormal; Notable for the following:    Platelets 130 (*)    All other components within normal limits  COMPREHENSIVE  METABOLIC PANEL - Abnormal; Notable for the following:    Glucose, Bld 113 (*)    Calcium 8.2 (*)    Total Protein 5.9 (*)    Albumin 3.1 (*)  GFR calc non Af Amer 84 (*)    All other components within normal limits  GLUCOSE, CAPILLARY - Abnormal; Notable for the following:    Glucose-Capillary 104 (*)    All other components within normal limits  GLUCOSE, CAPILLARY - Abnormal; Notable for the following:    Glucose-Capillary 101 (*)    All other components within normal limits  GLUCOSE, CAPILLARY - Abnormal; Notable for the following:    Glucose-Capillary 113 (*)    All other components within normal limits  GLUCOSE, CAPILLARY - Abnormal; Notable for the following:    Glucose-Capillary 140 (*)    All other components within normal limits  GLUCOSE, CAPILLARY - Abnormal; Notable for the following:    Glucose-Capillary 106 (*)    All other components within normal limits  OCCULT BLOOD, POC DEVICE - Abnormal; Notable for the following:    Fecal Occult Bld POSITIVE (*)    All other components within normal limits  CLOSTRIDIUM DIFFICILE BY PCR  LACTIC ACID, PLASMA  GLUCOSE, CAPILLARY  HEMOGLOBIN A1C  GLUCOSE, CAPILLARY  TYPE AND SCREEN  ABO/RH   Imaging Review No results found.  EKG Interpretation    Date/Time:    Ventricular Rate:    PR Interval:    QRS Duration:   QT Interval:    QTC Calculation:   R Axis:     Text Interpretation:              MDM   1. Lower GI bleed   2. GI bleed   3. Thrombocytopenia, unspecified   4. Unspecified hypothyroidism   5. Ischemic colitis   6. Other and unspecified hyperlipidemia   7. Rash and nonspecific skin eruption   8. Chronic rhinitis   9. HYPOTHYROIDISM   10. HYPERLIPIDEMIA   11. UNSPECIFIED THROMBOCYTOPENIA   12. Rectal bleeding   13. HEMORRHOIDS   14. GERD   15. LOW BACK PAIN   16. HEADACHE   17. RHINITIS   18. Medicare annual wellness visit, subsequent   19. Vertigo   20. BPH (benign prostatic  hyperplasia)   21. Situational anxiety    The patient appears reasonably stabilized for admission considering the current resources, flow, and capabilities available in the ED at this time, and I doubt any other [facility] requiring further screening and/or treatment in the ED prior to admission.    Hurman Horn, MD 03/12/13 [phone]

## 2013-03-06 NOTE — ED Notes (Signed)
He is awake, alert and oriented x 4.  He calmly tells me he is having no pain or discomfort.  He states he underwent colonoscopy ~4 years ago by Dr. Matthias Hughs; and articulates understanding of importance to follow up with him soon, according to Dr. Delford Field instructions.  These instructions were given in the presence of his wife.

## 2013-03-06 NOTE — ED Notes (Signed)
Pt c/o rectal bleeding onset 0300 last night, pt states he was seen this am for same. Pt states he continues to have maroon colored diarrhea x several times since going home.

## 2013-03-06 NOTE — ED Provider Notes (Signed)
CSN: [id number]     Arrival date & time 03/06/13  4098 History   First MD Initiated Contact with Patient 03/06/13 0700     Chief Complaint  Patient presents with  . Abdominal Pain   (Consider location/radiation/quality/duration/timing/severity/associated sxs/prior Treatment) HPI Patient reports he gets indigestion frequently. He states it happens especially when he eats certain foods especially chocolate cake. He states last night he had seafood and steak with chocolate cake. When he went to bed he had heartburn which he states is a burning sensation in his upper abdomen. He did not have nausea or vomiting. He took a Scientist, research (medical) and felt better and went to sleep. At 3 AM in the morning he was awakened with mid abdominal periumbilical abdominal pain followed by diarrhea. He states he was sitting on the toilet continuously for about an hour. He states he got shaky and felt cold and sweaty. He had mild nausea but no vomiting. EMS was called and when they got there he was feeling better. After they left he then had another episode of loose stool and saw some bright red blood in the water. No blood on the toilet tissue when he wiped. He started shaking again. After that he drank some Pedialyte and Coke and took a Xanax and the shaking is gone. He states he feels weak but he denies dizziness. He states currently he has no abdominal pain, nausea or vomiting. He states he was started on methotrexate for a skin rash about 3 months ago and was advised to watch for rectal bleeding. He states he had one episode of couple weeks ago and has had no further bleeding. His last colonoscopy was 4 years ago and he had some polyps removed. His wife ate the same food and feels fine. He has no other history of rectal bleeding.   PCP Dr Para March GI Dr Matthias Hughs  Past Medical History  Diagnosis Date  . Thrombocytopenia, unspecified   . Unspecified hypothyroidism   . Family history of diabetes mellitus   . History of chickenpox    . Other and unspecified hyperlipidemia   . Esophageal reflux    Past Surgical History  Procedure Laterality Date  . Appendectomy  1949   Family History  Problem Relation Age of Onset  . Stroke Mother   . Diabetes Mother     borderline  . Pneumonia Father   . Hypertension Sister   . Hyperlipidemia Sister   . Alzheimer's disease Brother   . Dementia Brother   . Arthritis Other   . Diabetes Other     1st degree relative  . Hyperlipidemia Other   . Hypertension Other   . Cancer Sister 30    colon  . Colon cancer Sister   . Prostate cancer Neg Hx    History  Substance Use Topics  . Smoking status: Never Smoker   . Smokeless tobacco: Never Used     Comment: smoked only as a teenage small amount  . Alcohol Use: Yes     Comment: very rare  lives at home  Lives with spouse  Review of Systems  All other systems reviewed and are negative.    Allergies  Antihistamines, diphenhydramine-type; Atorvastatin; Ezetimibe; Simvastatin; and Sulfonamide derivatives  Home Medications   Current Outpatient Rx  Name  Route  Sig  Dispense  Refill  . ALPRAZolam (XANAX) 0.5 MG tablet   Oral   Take 0.5 mg by mouth at bedtime as needed for anxiety (For MRI or root canals).         Marland Kitchen  calcium carbonate (TUMS - DOSED IN MG ELEMENTAL CALCIUM) 500 MG chewable tablet   Oral   Chew 1 tablet by mouth daily as needed for indigestion or heartburn.         . Coenzyme Q10 (CO Q 10 PO)   Oral   Take 1 capsule by mouth daily.         Marland Kitchen KRILL OIL PO   Oral   Take 1 tablet by mouth daily.         Marland Kitchen levothyroxine (SYNTHROID, LEVOTHROID) 50 MCG tablet   Oral   Take 50 mcg by mouth daily before breakfast.         . Multiple Vitamins-Minerals (MULTIVITAMIN WITH MINERALS) tablet   Oral   Take 1 tablet by mouth daily.         . Omega-3 Fatty Acids (FISH OIL PO)   Oral   Take 1 tablet by mouth daily.         . Red Yeast Rice Extract (RED YEAST RICE PO)   Oral   Take 1 tablet  by mouth daily.          Methotrexate 5 days a week for several months   BP 152/72  Pulse 95  Temp(Src) 98.6 F (37 C) (Oral)  Resp 17  Ht 5\' 5"  (1.651 m)  Wt 145 lb (65.772 kg)  BMI 24.13 kg/m2  SpO2 97%  Vital signs normal   Orthostatic VS normal  Physical Exam  Nursing note and vitals reviewed. Constitutional: He is oriented to person, place, and time. He appears well-developed and well-nourished.  Non-toxic appearance. He does not appear ill. No distress.  HENT:  Head: Normocephalic and atraumatic.  Right Ear: External ear normal.  Left Ear: External ear normal.  Nose: Nose normal. No mucosal edema or rhinorrhea.  Mouth/Throat: Oropharynx is clear and moist and mucous membranes are normal. No dental abscesses or uvula swelling.  Eyes: Conjunctivae and EOM are normal. Pupils are equal, round, and reactive to light.  Neck: Normal range of motion and full passive range of motion without pain. Neck supple.  Cardiovascular: Normal rate, regular rhythm and normal heart sounds.  Exam reveals no gallop and no friction rub.   No murmur heard. Pulmonary/Chest: Effort normal and breath sounds normal. No respiratory distress. He has no wheezes. He has no rhonchi. He has no rales. He exhibits no tenderness and no crepitus.  Abdominal: Soft. Normal appearance and bowel sounds are normal. He exhibits no distension. There is no tenderness. There is no rebound and no guarding.  Active bowel sounds  Musculoskeletal: Normal range of motion. He exhibits no edema and no tenderness.  Moves all extremities well.   Neurological: He is alert and oriented to person, place, and time. He has normal strength. No cranial nerve deficit.  Skin: Skin is warm, dry and intact. No rash noted. No erythema. No pallor.  Psychiatric: He has a normal mood and affect. His speech is normal and behavior is normal. His mood appears not anxious.    ED Course  Procedures (including critical care time)    Pt  did not want to have an IV.   IV was started by nursing staff. Pt had no further episodes of diarrhea while in the ED.  He does not have abdominal pain so no CT scan was done.   Pt given IV fluids.   Labs Review Results for orders placed during the hospital encounter of 03/06/13  CBC WITH DIFFERENTIAL  Result Value Range   WBC 10.9 (*) 4.0 - 10.5 K/uL   RBC 4.79  4.22 - 5.81 MIL/uL   Hemoglobin 15.8  13.0 - 17.0 g/dL   HCT 40.9  81.1 - 91.4 %   MCV 93.1  78.0 - 100.0 fL   MCH 33.0  26.0 - 34.0 pg   MCHC 35.4  30.0 - 36.0 g/dL   RDW 78.2  95.6 - 21.3 %   Platelets 125 (*) 150 - 400 K/uL   Neutrophils Relative % 91 (*) 43 - 77 %   Neutro Abs 9.8 (*) 1.7 - 7.7 K/uL   Lymphocytes Relative 2 (*) 12 - 46 %   Lymphs Abs 0.2 (*) 0.7 - 4.0 K/uL   Monocytes Relative 7  3 - 12 %   Monocytes Absolute 0.8  0.1 - 1.0 K/uL   Eosinophils Relative 0  0 - 5 %   Eosinophils Absolute 0.0  0.0 - 0.7 K/uL   Basophils Relative 0  0 - 1 %   Basophils Absolute 0.0  0.0 - 0.1 K/uL  COMPREHENSIVE METABOLIC PANEL      Result Value Range   Sodium 136  135 - 145 mEq/L   Potassium 4.7  3.5 - 5.1 mEq/L   Chloride 100  96 - 112 mEq/L   CO2 26  19 - 32 mEq/L   Glucose, Bld 118 (*) 70 - 99 mg/dL   BUN 20  6 - 23 mg/dL   Creatinine, Ser 0.86  0.50 - 1.35 mg/dL   Calcium 57.8  8.4 - 46.9 mg/dL   Total Protein 7.3  6.0 - 8.3 g/dL   Albumin 3.9  3.5 - 5.2 g/dL   AST 54 (*) 0 - 37 U/L   ALT 38  0 - 53 U/L   Alkaline Phosphatase 91  39 - 117 U/L   Total Bilirubin 0.3  0.3 - 1.2 mg/dL   GFR calc non Af Amer 84 (*) >90 mL/min   GFR calc Af Amer >90  >90 mL/min  PROTIME-INR      Result Value Range   Prothrombin Time 12.8  11.6 - 15.2 seconds   INR 0.98  0.00 - 1.49  APTT      Result Value Range   aPTT 27  24 - 37 seconds  OCCULT BLOOD, POC DEVICE      Result Value Range   Fecal Occult Bld NEGATIVE  NEGATIVE   Laboratory interpretation all normal except leukocytosis     Imaging Review No  results found.  EKG Interpretation   None       MDM   1. Diarrhea   2. Rectal bleeding     Plan discharge  Devoria Albe, MD, Franz Dell, MD 03/06/13 [phone]

## 2013-03-06 NOTE — ED Notes (Signed)
Pt c/o abd pain and diarrhea last night; noticed bright red blood in stool this morning; no nausea

## 2013-03-07 ENCOUNTER — Inpatient Hospital Stay (HOSPITAL_COMMUNITY): Payer: Medicare Other

## 2013-03-07 ENCOUNTER — Encounter (HOSPITAL_COMMUNITY): Payer: Self-pay | Admitting: Internal Medicine

## 2013-03-07 DIAGNOSIS — R21 Rash and other nonspecific skin eruption: Secondary | ICD-10-CM

## 2013-03-07 DIAGNOSIS — K625 Hemorrhage of anus and rectum: Secondary | ICD-10-CM | POA: Diagnosis present

## 2013-03-07 DIAGNOSIS — E039 Hypothyroidism, unspecified: Secondary | ICD-10-CM

## 2013-03-07 DIAGNOSIS — K8020 Calculus of gallbladder without cholecystitis without obstruction: Secondary | ICD-10-CM | POA: Diagnosis not present

## 2013-03-07 DIAGNOSIS — K922 Gastrointestinal hemorrhage, unspecified: Secondary | ICD-10-CM | POA: Diagnosis not present

## 2013-03-07 DIAGNOSIS — D696 Thrombocytopenia, unspecified: Secondary | ICD-10-CM | POA: Diagnosis not present

## 2013-03-07 DIAGNOSIS — K5289 Other specified noninfective gastroenteritis and colitis: Secondary | ICD-10-CM | POA: Diagnosis not present

## 2013-03-07 DIAGNOSIS — K559 Vascular disorder of intestine, unspecified: Principal | ICD-10-CM

## 2013-03-07 DIAGNOSIS — E785 Hyperlipidemia, unspecified: Secondary | ICD-10-CM

## 2013-03-07 LAB — COMPREHENSIVE METABOLIC PANEL
ALT: 40 U/L (ref 0–53)
AST: 36 U/L (ref 0–37)
Albumin: 3.5 g/dL (ref 3.5–5.2)
Alkaline Phosphatase: 69 U/L (ref 39–117)
BUN: 9 mg/dL (ref 6–23)
Calcium: 8.2 mg/dL — ABNORMAL LOW (ref 8.4–10.5)
Calcium: 8.8 mg/dL (ref 8.4–10.5)
Creatinine, Ser: 0.66 mg/dL (ref 0.50–1.35)
Creatinine, Ser: 0.74 mg/dL (ref 0.50–1.35)
GFR calc Af Amer: >90 mL/min (ref >90)
GFR calc non Af Amer: 84 mL/min — ABNORMAL LOW (ref >90)
GFR calc non Af Amer: 88 mL/min — ABNORMAL LOW (ref >90)
Glucose, Bld: 113 mg/dL — ABNORMAL HIGH (ref 70–99)
Glucose, Bld: 119 mg/dL — ABNORMAL HIGH (ref 70–99)
Potassium: 3.9 mEq/L (ref 3.5–5.1)
Total Bilirubin: 0.5 mg/dL (ref 0.3–1.2)
Total Protein: 5.9 g/dL — ABNORMAL LOW (ref 6.0–8.3)
Total Protein: 6.5 g/dL (ref 6.0–8.3)

## 2013-03-07 LAB — CBC
HCT: 37.9 % — ABNORMAL LOW (ref 39.0–52.0)
HCT: 39 % (ref 39.0–52.0)
HCT: 39.9 % (ref 39.0–52.0)
Hemoglobin: 13.2 g/dL (ref 13.0–17.0)
Hemoglobin: 13.7 g/dL (ref 13.0–17.0)
MCH: 31.7 pg (ref 26.0–34.0)
MCH: 32.2 pg (ref 26.0–34.0)
MCH: 32.6 pg (ref 26.0–34.0)
MCHC: 34.3 g/dL (ref 30.0–36.0)
MCHC: 34.3 g/dL (ref 30.0–36.0)
MCV: 92.4 fL (ref 78.0–100.0)
MCV: 93.5 fL (ref 78.0–100.0)
MCV: 93.9 fL (ref 78.0–100.0)
Platelets: 118 10*3/uL — ABNORMAL LOW (ref 150–400)
Platelets: 121 10*3/uL — ABNORMAL LOW (ref 150–400)
RBC: 4.05 MIL/uL — ABNORMAL LOW (ref 4.22–5.81)
RBC: 4.17 MIL/uL — ABNORMAL LOW (ref 4.22–5.81)
RBC: 4.25 MIL/uL (ref 4.22–5.81)
RDW: 14.7 % (ref 11.5–15.5)
WBC: 7 10*3/uL (ref 4.0–10.5)
WBC: 7.4 10*3/uL (ref 4.0–10.5)

## 2013-03-07 LAB — GLUCOSE, CAPILLARY
Glucose-Capillary: 101 mg/dL — ABNORMAL HIGH (ref 70–99)
Glucose-Capillary: 81 mg/dL (ref 70–99)
Glucose-Capillary: 113 mg/dL — ABNORMAL HIGH (ref 70–99)

## 2013-03-07 LAB — ABO/RH: ABO/RH(D): A POS

## 2013-03-07 LAB — TYPE AND SCREEN: Antibody Screen: NEGATIVE

## 2013-03-07 MED ORDER — ONDANSETRON HCL 4 MG PO TABS: 4.0000 mg | ORAL_TABLET | Freq: Four times a day (QID) | ORAL | Status: DC | PRN

## 2013-03-07 MED ORDER — INSULIN ASPART 100 UNIT/ML ~~LOC~~ SOLN: 0.0000 [IU] | Freq: Three times a day (TID) | SUBCUTANEOUS | Status: DC

## 2013-03-07 MED ORDER — LEVOTHYROXINE SODIUM 100 MCG IV SOLR
25.0000 ug | Freq: Every day | INTRAVENOUS | Status: DC
  Administered 2013-03-07 – 2013-03-08 (×2): 25 ug via INTRAVENOUS
  Filled 2013-03-07 (×2): qty 5

## 2013-03-07 MED ORDER — ONDANSETRON HCL 4 MG/2ML IJ SOLN: 4.0000 mg | Freq: Four times a day (QID) | INTRAMUSCULAR | Status: DC | PRN

## 2013-03-07 MED ORDER — CYCLOBENZAPRINE HCL 5 MG PO TABS
5.0000 mg | ORAL_TABLET | Freq: Three times a day (TID) | ORAL | Status: DC | PRN
  Filled 2013-03-07: qty 1

## 2013-03-07 MED ORDER — ACETAMINOPHEN 650 MG RE SUPP: 650.0000 mg | Freq: Four times a day (QID) | RECTAL | Status: DC | PRN

## 2013-03-07 MED ORDER — IOHEXOL 300 MG/ML  SOLN
100.0000 mL | Freq: Once | INTRAMUSCULAR | Status: AC | PRN
  Administered 2013-03-07: 100 mL via INTRAVENOUS

## 2013-03-07 MED ORDER — ACETAMINOPHEN 325 MG PO TABS: 650.0000 mg | ORAL_TABLET | Freq: Four times a day (QID) | ORAL | Status: DC | PRN

## 2013-03-07 MED ORDER — METRONIDAZOLE IN NACL 5-0.79 MG/ML-% IV SOLN
500.0000 mg | Freq: Three times a day (TID) | INTRAVENOUS | Status: DC
  Administered 2013-03-07 – 2013-03-08 (×4): 500 mg via INTRAVENOUS
  Filled 2013-03-07 (×6): qty 100

## 2013-03-07 MED ORDER — CIPROFLOXACIN IN D5W 400 MG/200ML IV SOLN
400.0000 mg | Freq: Two times a day (BID) | INTRAVENOUS | Status: DC
  Administered 2013-03-07 – 2013-03-08 (×3): 400 mg via INTRAVENOUS
  Filled 2013-03-07 (×4): qty 200

## 2013-03-07 MED ORDER — SODIUM CHLORIDE 0.9 % IJ SOLN
3.0000 mL | Freq: Two times a day (BID) | INTRAMUSCULAR | Status: DC
  Administered 2013-03-07 – 2013-03-08 (×3): 3 mL via INTRAVENOUS

## 2013-03-07 MED ORDER — PANTOPRAZOLE SODIUM 40 MG IV SOLR
40.0000 mg | Freq: Two times a day (BID) | INTRAVENOUS | Status: DC
  Administered 2013-03-07 – 2013-03-08 (×3): 40 mg via INTRAVENOUS
  Filled 2013-03-07 (×4): qty 40

## 2013-03-07 MED ORDER — HYDROCODONE-ACETAMINOPHEN 5-325 MG PO TABS: 1.0000 | ORAL_TABLET | Freq: Once | ORAL | Status: DC

## 2013-03-07 MED ORDER — SODIUM CHLORIDE 0.9 % IV SOLN
INTRAVENOUS | Status: AC
  Administered 2013-03-07: 06:00:00 via INTRAVENOUS

## 2013-03-07 MED ORDER — IOHEXOL 300 MG/ML  SOLN
50.0000 mL | Freq: Once | INTRAMUSCULAR | Status: AC | PRN
  Administered 2013-03-07: 50 mL via ORAL

## 2013-03-07 NOTE — Progress Notes (Signed)
[facility] PROGRESS NOTE  Larry [name] WGN:[id number] DOB: 1929/06/26 DOA: 03/06/2013 PCP: Crawford Givens, MD  Brief history 79 year old male with a history of hypothyroidism, nonspecific dermatitis, and hypertriglyceridemia presents with one-day history of rectal bleeding. This started on the morning of 03/05/2013 associated with some abdominal cramping. He had 5 episodes of rectal bleeding. He came to the emergency department. His hemoglobin was stable. He was sent home. When he got home, he continued to have 3-4 loose bowel movements with bleeding. He came to the emergency department. His hemoglobin remained stable. He remained hemodynamically stable. He denies any trauma or undercooked foods or recent travels. No sick contacts. He denies any claudication, dizziness, nausea, vomiting, fevers, chills. Assessment/Plan: GI bleed -Hemoglobin is stable -Likely ischemic colitis versus diverticular bleed -CT abdomen pelvis shows descending colon inflammatory changes -I discussed case with Dr. Ewing Schlein [facility])--he stated to continue current course of treatment and advance diet 03/08/13 -if bleeding does not decrease or clinically worsens, he will be happy to consult -Continue ciprofloxacin and Flagyl IV -Start a liquid diet -Avoid NSAIDs -Monitor Hgb -INR 0.98, PTT 27 Hypothyroidism -Continue Synthroid Thrombocytopenia -Appears to be chronic -Partly due to methotrexate Nonspecific dermatitis -Hold methotrexate for now -Has been taking methotrexate for 2 months Hypertriglyceridemia -Hold fish oill for now  Family Communication:   No family at beside Disposition Plan:   Home 1-2 days   Antibiotics:  Ciprofloxacin 03/07/2013>>>  Flagyl 03/07/2013>>>    Procedures/Studies: Ct Abdomen Pelvis W Contrast  03/07/2013   CLINICAL DATA:  Rectal bleeding, diarrhea  EXAM: CT ABDOMEN AND PELVIS WITH CONTRAST  TECHNIQUE: Multidetector CT imaging of the abdomen and pelvis was  performed using the standard protocol following bolus administration of intravenous contrast.  CONTRAST:  OMNIPAQUE IOHEXOL 300 MG/ML  SOLN  COMPARISON:  None.  FINDINGS: Linear opacity within the visualized lung bases is most compatible with atelectasis and/or scarring. Lungs are otherwise unremarkable.  The liver demonstrates a normal contrast enhanced appearance. Scattered calcified stones are noted within the gallbladder lumen. New CT evidence of acute cholecystitis. The spleen, adrenal glands, and pancreas are within normal limits.  1.5 cm simple cyst is noted within the upper pole of the right kidney. Additional scattered subcentimeter hypodensities within both kidneys are too small the characterize by CT, but statistically likely represent cysts. No nephrolithiasis, hydronephrosis, or focal enhancing renal mass.  There is no evidence of bowel obstruction. Small hiatal hernia is noted. Diffuse long segment wall thickening and edema with associated inflammatory fat stranding is seen within the descending colon from the level of the splenic flexure to the level of the sigmoid colon. Findings are most compatible with colitis. Scattered fluid seen along the left pericolic gutter. No free air identified to suggest perforation. Scattered colonic diverticula are also noted within the descending colon. Transverse and ascending colon are within normal limits. Small bowel is unremarkable. Appendix is not visualized, compatible with prior appendectomy.  Bilateral fat containing inguinal hernias are noted, left greater than right.  Bladder is well distended grossly normal. Prostate is enlarged measuring up to 5.3 cm in diameter.  No pathologically enlarged intra-abdominal or pelvic lymph nodes are identified. Scattered calcified atheromatous disease noted within the intra-abdominal aorta and its branch vessels. No focal lytic or blastic osseous lesions identified.  IMPRESSION: 1. Findings compatible with acute  colitis involving the descending colon. No loculated fluid collection to suggest abscess or evidence of perforation identified. 2. Diverticulosis without acute diverticulitis. 3. Cholelithiasis without acute cholecystitis. 4. Small  hiatal hernia   Electronically Signed   By: Rise Mu M.D.   On: 03/07/2013 04:39         Subjective: Patient states her abdominal pain is better today but is still having a little abdominal cramping. Denies any fevers, chills, chest discomfort, nausea, vomiting, diarrhea, abdominal pain.  Objective: Filed Vitals:   03/07/13 0130 03/07/13 0230 03/07/13 0430 03/07/13 0510  BP: 121/65 124/66 135/79 149/75  Pulse: 72 74 75 79  Temp:    98.6 F (37 C)  TempSrc:    Oral  Resp:    18  Height:    5\' 5"  (1.651 m)  Weight:    67.2 kg (148 lb 2.4 oz)  SpO2: 93% 96% 97% 100%    Intake/Output Summary (Last 24 hours) at 03/07/13 0949 Last data filed at 03/07/13 9147  Gross per 24 hour  Intake   2080 ml  Output      1 ml  Net   2079 ml   Weight change:  Exam:   General:  Pt is alert, follows commands appropriately, not in acute distress  HEENT: No icterus, No thrush, [place]/AT  Cardiovascular: RRR, S1/S2, no rubs, no gallops  Respiratory: CTA bilaterally, no wheezing, no crackles, no rhonchi  Abdomen: Soft/+BS, non tender, non distended, no guarding  Extremities: No edema, No lymphangitis, No petechiae, No rashes, no synovitis  Data Reviewed: Basic Metabolic Panel:  Recent Labs Lab 03/06/13 0810 03/06/13 2335 03/07/13 0728  NA 136 137 138  K 4.7 3.7 3.9  CL 100 103 105  CO2 26 23 26   GLUCOSE 118* 119* 113*  BUN 20 14 9   CREATININE 0.75 0.66 0.74  CALCIUM 10.0 8.8 8.2*   Liver Function Tests:  Recent Labs Lab 03/06/13 0810 03/06/13 2335 03/07/13 0728  AST 54* 36 29  ALT 38 47 40  ALKPHOS 91 78 69  BILITOT 0.3 0.5 0.5  PROT 7.3 6.5 5.9*  ALBUMIN 3.9 3.5 3.1*   No results found for this basename: LIPASE, AMYLASE,  in the  last 168 hours No results found for this basename: AMMONIA,  in the last 168 hours CBC:  Recent Labs Lab 03/06/13 0810 03/06/13 2335 03/07/13 0728  WBC 10.9* 7.4 6.9  NEUTROABS 9.8*  --   --   HGB 15.8 13.8 13.2  HCT 44.6 40.2 37.9*  MCV 93.1 92.4 93.6  PLT 125* 121* 121*   Cardiac Enzymes: No results found for this basename: CKTOTAL, CKMB, CKMBINDEX, TROPONINI,  in the last 168 hours BNP: No components found with this basename: POCBNP,  CBG:  Recent Labs Lab 03/07/13 0526 03/07/13 0752  GLUCAP 104* 101*    No results found for this or any previous visit (from the past 240 hour(s)).   Scheduled Meds: . ciprofloxacin  400 mg Intravenous Q12H  . levothyroxine  25 mcg Intravenous Daily  . metronidazole  500 mg Intravenous Q8H  . pantoprazole (PROTONIX) IV  40 mg Intravenous Q12H  . sodium chloride  3 mL Intravenous Q12H   Continuous Infusions: . sodium chloride 100 mL/hr at 03/07/13 0559     Corgan Mormile, DO  [facility] Pager [phone]  If 7PM-7AM, please contact night-coverage [email or web address] Password [email or web address] 03/07/2013, 9:49 AM   LOS: 1 day

## 2013-03-07 NOTE — Progress Notes (Signed)
ANTIBIOTIC CONSULT NOTE - INITIAL  Pharmacy Consult for IV Cipro Indication: colitis  Allergies  Allergen Reactions  . Antihistamines, Diphenhydramine-Type     Urinary retention  . Atorvastatin     REACTION: Intolerance  . Ezetimibe     REACTION: Intolerance  . Simvastatin     REACTION: Intolerance  . Sulfonamide Derivatives     REACTION: hives, itching    Patient Measurements: Height: 5\' 5"  (165.1 cm) Weight: 148 lb 2.4 oz (67.2 kg) IBW/kg (Calculated) : 61.5   Vital Signs: Temp: 98.6 F (37 C) (12/18 0510) Temp src: Oral (12/18 0510) BP: 149/75 mmHg (12/18 0510) Pulse Rate: 79 (12/18 0510) Intake/Output from previous day: 12/17 0701 - 12/18 0700 In: 2000 [I.V.:2000] Out: 1 [Urine:1] Intake/Output from this shift: Total I/O In: 2000 [I.V.:2000] Out: 1 [Urine:1]  Labs:  Recent Labs  03/06/13 0810 03/06/13 2335  WBC 10.9* 7.4  HGB 15.8 13.8  PLT 125* 121*  CREATININE 0.75 0.66   Estimated Creatinine Clearance: 63 ml/min (by C-G formula based on Cr of 0.66). No results found for this basename: VANCOTROUGH, VANCOPEAK, VANCORANDOM, GENTTROUGH, GENTPEAK, GENTRANDOM, TOBRATROUGH, TOBRAPEAK, TOBRARND, AMIKACINPEAK, AMIKACINTROU, AMIKACIN,  in the last 72 hours   Microbiology: No results found for this or any previous visit (from the past 720 hour(s)).  Medical History: Past Medical History  Diagnosis Date  . Thrombocytopenia, unspecified   . Unspecified hypothyroidism   . Family history of diabetes mellitus   . History of chickenpox   . Other and unspecified hyperlipidemia   . Esophageal reflux     Medications:  Scheduled:  . ciprofloxacin  400 mg Intravenous Q12H  . levothyroxine  25 mcg Intravenous Daily  . metronidazole  500 mg Intravenous Q8H  . pantoprazole (PROTONIX) IV  40 mg Intravenous Q12H  . sodium chloride  3 mL Intravenous Q12H   Infusions:  . sodium chloride 100 mL/hr at 03/07/13 0559   Assessment: 79 yo admitted with rectal  bleeding.  Cipro per Rx for colitis.   Goal of Therapy:  Treat infection  Plan:   Cipro 400mg  IV q12h  F/U SCr as needed  Susanne Greenhouse R 03/07/2013,6:37 AM

## 2013-03-07 NOTE — H&P (Addendum)
[facility] History and Physical  Larry [name] ZOX:[id number] DOB: 02-16-1930 DOA: 03/06/2013  Referring physician: ER physician. PCP: Larry Givens, MD   Chief Complaint: Rectal bleeding.  HPI: Larry [name] is a 79 y.o. male history of hypothyroidism and hypertriglyceridemia who was recently started methotrexate 2 months ago for skin rash presented to the ER for the second time in the last 24 hours with rectal bleeding. Patient states that he woke up yesterday morning with multiple episodes of diarrhea at least 5 times. Eventually when he looked into the commode he saw up frank blood and came to the ER. In the ER his hemoglobin was found to be stable and patient was sent home. After he went home he had 4 episodes of frank rectal bleeding and he came back to the ER. Repeat CBC shows 2 g drop in hemoglobin and rectal exam shows frank blood. Patient has been admitted for rectal bleeding. Patient states that during the first episodes patient had lower abdominal crampy pain but during the later episodes patient was having painless bleeding. Patient denies having taken any antibiotics recently. Denies nausea vomiting fever chills or previous episodes of rectal bleeding. Patient presently is hemodynamically stable. Patient otherwise denies any chest pain shortness of breath dizziness loss of consciousness focal deficits.   Patient takes NSAIDs occasionally for right shoulder pain. Last one he took was 3 days ago.  Patient has had a colonoscopy 5 years ago.  Review of Systems: As presented in the history of presenting illness, rest negative.  Past Medical History  Diagnosis Date  . Thrombocytopenia, unspecified   . Unspecified hypothyroidism   . Family history of diabetes mellitus   . History of chickenpox   . Other and unspecified hyperlipidemia   . Esophageal reflux    Past Surgical History  Procedure Laterality Date  . Appendectomy  1949   Social History:  reports that he has  never smoked. He has never used smokeless tobacco. He reports that he drinks alcohol. He reports that he does not use illicit drugs. Where does patient live home. Can patient participate in ADLs? Yes.  Allergies  Allergen Reactions  . Antihistamines, Diphenhydramine-Type     Urinary retention  . Atorvastatin     REACTION: Intolerance  . Ezetimibe     REACTION: Intolerance  . Simvastatin     REACTION: Intolerance  . Sulfonamide Derivatives     REACTION: hives, itching    Family History:  Family History  Problem Relation Age of Onset  . Stroke Mother   . Diabetes Mother     borderline  . Pneumonia Father   . Hypertension Sister   . Hyperlipidemia Sister   . Alzheimer's disease Brother   . Dementia Brother   . Arthritis Other   . Diabetes Other     1st degree relative  . Hyperlipidemia Other   . Hypertension Other   . Cancer Sister 66    colon  . Colon cancer Sister   . Prostate cancer Neg Hx       Prior to Admission medications   Medication Sig Start Date End Date Taking? Authorizing Provider  ALPRAZolam Prudy Feeler) 0.5 MG tablet Take 0.5 mg by mouth at bedtime as needed for anxiety (For MRI or root canals).   Yes Historical Provider, MD  calcium carbonate (TUMS - DOSED IN MG ELEMENTAL CALCIUM) 500 MG chewable tablet Chew 1 tablet by mouth daily as needed for indigestion or heartburn.   Yes Historical Provider, MD  Coenzyme Q10 (CO  Q 10 PO) Take 1 capsule by mouth daily.   Yes Historical Provider, MD  KRILL OIL PO Take 1 tablet by mouth daily.   Yes Historical Provider, MD  levothyroxine (SYNTHROID, LEVOTHROID) 50 MCG tablet Take 50 mcg by mouth daily before breakfast.   Yes Historical Provider, MD  methotrexate (RHEUMATREX) 2.5 MG tablet Take 12.5 mg by mouth once a week. Takes every Thursday, tech states patient received 12/11 Caution:Chemotherapy. Protect from light.   Yes Historical Provider, MD  Multiple Vitamins-Minerals (MULTIVITAMIN WITH MINERALS) tablet Take 1  tablet by mouth daily.   Yes Historical Provider, MD  Omega-3 Fatty Acids (FISH OIL PO) Take 1 tablet by mouth daily.   Yes Historical Provider, MD  Red Yeast Rice Extract (RED YEAST RICE PO) Take 1 tablet by mouth daily.   Yes Historical Provider, MD    Physical Exam: Filed Vitals:   03/06/13 2219 03/06/13 2334 03/07/13 0000 03/07/13 0121  BP: 131/81 142/73 137/72 127/68  Pulse: 82 72 76 80  Temp:    98.6 F (37 C)  TempSrc:    Oral  Resp:  18  18  Height:      Weight:      SpO2:  97% 98% 97%     General:  Well-developed well-nourished.  Eyes: Anicteric no pallor.  ENT: No discharge from ears eyes nose mouth.  Neck: No mass felt.  Cardiovascular: S1-S2 heard.  Respiratory: No rhonchi or crepitations.  Abdomen: Soft nontender bowel sounds present. No guarding no rigidity.  Skin: Patient has chronic skin rashes on the trunk.  Musculoskeletal: No edema.  Psychiatric: Appears normal.  Neurologic:  Alert awake oriented to time place and person. Moves all extremities.  Labs on Admission:  Basic Metabolic Panel:  Recent Labs Lab 03/06/13 0810 03/06/13 2335  NA 136 137  K 4.7 3.7  CL 100 103  CO2 26 23  GLUCOSE 118* 119*  BUN 20 14  CREATININE 0.75 0.66  CALCIUM 10.0 8.8   Liver Function Tests:  Recent Labs Lab 03/06/13 0810 03/06/13 2335  AST 54* 36  ALT 38 47  ALKPHOS 91 78  BILITOT 0.3 0.5  PROT 7.3 6.5  ALBUMIN 3.9 3.5   No results found for this basename: LIPASE, AMYLASE,  in the last 168 hours No results found for this basename: AMMONIA,  in the last 168 hours CBC:  Recent Labs Lab 03/06/13 0810 03/06/13 2335  WBC 10.9* 7.4  NEUTROABS 9.8*  --   HGB 15.8 13.8  HCT 44.6 40.2  MCV 93.1 92.4  PLT 125* 121*   Cardiac Enzymes: No results found for this basename: CKTOTAL, CKMB, CKMBINDEX, TROPONINI,  in the last 168 hours  BNP (last 3 results) No results found for this basename: PROBNP,  in the last 8760 hours CBG: No results found  for this basename: GLUCAP,  in the last 168 hours  Radiological Exams on Admission: No results found.   Assessment/Plan Principal Problem:   Rectal bleeding Active Problems:   HYPOTHYROIDISM   HYPERLIPIDEMIA   UNSPECIFIED THROMBOCYTOPENIA   1. Rectal bleeding - since patient initially had some crampy abdominal pain with the bleed I have ordered CT abdomen and pelvis to check for any signs of ischemic colitis. Patient will be kept n.p.o. Since patient has taken NSAIDs though the bleed looks more lower GI for now I have placed patient on Protonix IV every 12 hourly. I have ordered CBC every 4 hourly. Type and screen. Gently hydrate. Keep patient n.p.o. Consult  GI in a.m. Patient is presently hemodynamically stable. 2. Thrombocytopenia appears to be chronic - closely follow CBC. 3. History of skin rash on methotrexate - holding methotrexate for now. Patient n.p.o. 4. Hypothyroidism - I have placed patient on IV Synthroid. 5. Hypertriglyceridemia - holding off medications for now because patient is n.p.o.  I have reviewed patient's old charts compared old labs to the present one.    Code Status: Full code.  Family Communication: Family the bedside.  Disposition Plan: Admit to inpatient.    Azlan Hanway N. [facility] Pager [phone].  If 7PM-7AM, please contact night-coverage [email or web address] Password [email or web address] 03/07/2013, 2:08 AM

## 2013-03-08 DIAGNOSIS — K219 Gastro-esophageal reflux disease without esophagitis: Secondary | ICD-10-CM

## 2013-03-08 DIAGNOSIS — K922 Gastrointestinal hemorrhage, unspecified: Secondary | ICD-10-CM | POA: Diagnosis not present

## 2013-03-08 LAB — HEMOGLOBIN A1C
Hgb A1c MFr Bld: 5.5 % (ref <5.7)
Mean Plasma Glucose: 111 mg/dL (ref <117)

## 2013-03-08 MED ORDER — CIPROFLOXACIN HCL 250 MG PO TABS: 250.0000 mg | ORAL_TABLET | Freq: Two times a day (BID) | ORAL | Status: DC

## 2013-03-08 MED ORDER — ONDANSETRON HCL 4 MG PO TABS: 4.0000 mg | ORAL_TABLET | Freq: Four times a day (QID) | ORAL | Status: DC | PRN

## 2013-03-08 MED ORDER — METRONIDAZOLE 500 MG PO TABS: 500.0000 mg | ORAL_TABLET | Freq: Three times a day (TID) | ORAL | Status: DC

## 2013-03-08 NOTE — Discharge Summary (Signed)
Physician Discharge Summary  Larry [name] [id number]:[id number] DOB: Mar 24, 1929 DOA: 03/06/2013  PCP: Crawford Givens, MD  Admit date: 03/06/2013 Discharge date: 03/08/2013  Time spent: 45 minutes  Recommendations for Outpatient Follow-up:  -Will be discharged home today. -Advised to follow up with GI next week, per their recommendations, for repeat CBC.   Discharge Diagnoses:  Principal Problem:   Rectal bleeding Active Problems:   HYPOTHYROIDISM   HYPERLIPIDEMIA   UNSPECIFIED THROMBOCYTOPENIA   Ischemic colitis   Discharge Condition: Stable  Filed Weights   03/06/13 2202 03/07/13 0510  Weight: 65.772 kg (145 lb) 67.2 kg (148 lb 2.4 oz)    History of present illness:  Larry [name] is a 79 y.o. male history of hypothyroidism and hypertriglyceridemia who was recently started methotrexate 2 months ago for skin rash presented to the ER for the second time in the last 24 hours with rectal bleeding. Patient states that he woke up yesterday morning with multiple episodes of diarrhea at least 5 times. Eventually when he looked into the commode he saw up frank blood and came to the ER. In the ER his hemoglobin was found to be stable and patient was sent home. After he went home he had 4 episodes of frank rectal bleeding and he came back to the ER. Repeat CBC shows 2 g drop in hemoglobin and rectal exam shows frank blood. Patient has been admitted for rectal bleeding. Patient states that during the first episodes patient had lower abdominal crampy pain but during the later episodes patient was having painless bleeding. Patient denies having taken any antibiotics recently. Denies nausea vomiting fever chills or previous episodes of rectal bleeding. Patient presently is hemodynamically stable. Patient otherwise denies any chest pain shortness of breath dizziness loss of consciousness focal deficits.  Patient takes NSAIDs occasionally for right shoulder pain. Last one he took was 3 days ago.   Patient has had a colonoscopy 5 years ago. We were asked to admit him for further evaluation and management.   Hospital Course:   GI bleed  -Hemoglobin is stable at 13.6 since admission. -Likely infectious colitis versus diverticular bleed  -CT abdomen pelvis shows descending colon inflammatory changes  -My partner, Dr. Arbutus Leas, discussed case with Dr. Ewing Schlein [facility])--he stated to continue current course of treatment and advance diet. If bleeding does not decrease or clinically worsens, he will be happy to consult and should follow up in the office next week for a repeat CBC. -Continue ciprofloxacin and Flagyl, but change to PO.  -Has tolerated a solid meal without issue. -Avoid NSAIDs   Hypothyroidism  -Continue Synthroid   Thrombocytopenia  -Appears to be chronic. -Stable at 130. -Partly due to methotrexate   Nonspecific dermatitis  -Has been taking methotrexate for 2 months   Hypertriglyceridemia  -Continue fish oil.   Procedures:  None   Consultations:  GI (curbside consult with Dr. Ewing Schlein)  Discharge Instructions  Discharge Orders   Future Orders Complete By Expires   Discontinue IV  As directed    Increase activity slowly  As directed        Medication List         ALPRAZolam 0.5 MG tablet  Commonly known as:  XANAX  Take 0.5 mg by mouth at bedtime as needed for anxiety (For MRI or root canals).     calcium carbonate 500 MG chewable tablet  Commonly known as:  TUMS - dosed in mg elemental calcium  Chew 1 tablet by mouth daily as needed for indigestion  or heartburn.     ciprofloxacin 250 MG tablet  Commonly known as:  CIPRO  Take 1 tablet (250 mg total) by mouth 2 (two) times daily. For 14 days     CO Q 10 PO  Take 1 capsule by mouth daily.     FISH OIL PO  Take 1 tablet by mouth daily.     KRILL OIL PO  Take 1 tablet by mouth daily.     levothyroxine 50 MCG tablet  Commonly known as:  SYNTHROID, LEVOTHROID  Take 50 mcg by mouth daily before  breakfast.     methotrexate 2.5 MG tablet  Commonly known as:  RHEUMATREX  - Take 12.5 mg by mouth once a week. Takes every Thursday, tech states patient received 12/11  - Caution:Chemotherapy. Protect from light.     metroNIDAZOLE 500 MG tablet  Commonly known as:  FLAGYL  Take 1 tablet (500 mg total) by mouth 3 (three) times daily. For 14 days     multivitamin with minerals tablet  Take 1 tablet by mouth daily.     ondansetron 4 MG tablet  Commonly known as:  ZOFRAN  Take 1 tablet (4 mg total) by mouth every 6 (six) hours as needed for nausea.     RED YEAST RICE PO  Take 1 tablet by mouth daily.       Allergies  Allergen Reactions  . Antihistamines, Diphenhydramine-Type     Urinary retention  . Atorvastatin     REACTION: Intolerance  . Ezetimibe     REACTION: Intolerance  . Simvastatin     REACTION: Intolerance  . Sulfonamide Derivatives     REACTION: hives, itching       Follow-up Information   Follow up with Florencia Reasons, MD. Schedule an appointment as soon as possible for a visit in 5 days.   Specialty:  Gastroenterology   Contact information:   [address] [place] [address] [phone]        The results of significant diagnostics from this hospitalization (including imaging, microbiology, ancillary and laboratory) are listed below for reference.    Significant Diagnostic Studies: Ct Abdomen Pelvis W Contrast  03/07/2013   CLINICAL DATA:  Rectal bleeding, diarrhea  EXAM: CT ABDOMEN AND PELVIS WITH CONTRAST  TECHNIQUE: Multidetector CT imaging of the abdomen and pelvis was performed using the standard protocol following bolus administration of intravenous contrast.  CONTRAST:  OMNIPAQUE IOHEXOL 300 MG/ML  SOLN  COMPARISON:  None.  FINDINGS: Linear opacity within the visualized lung bases is most compatible with atelectasis and/or scarring. Lungs are otherwise unremarkable.  The liver demonstrates a normal contrast enhanced  appearance. Scattered calcified stones are noted within the gallbladder lumen. New CT evidence of acute cholecystitis. The spleen, adrenal glands, and pancreas are within normal limits.  1.5 cm simple cyst is noted within the upper pole of the right kidney. Additional scattered subcentimeter hypodensities within both kidneys are too small the characterize by CT, but statistically likely represent cysts. No nephrolithiasis, hydronephrosis, or focal enhancing renal mass.  There is no evidence of bowel obstruction. Small hiatal hernia is noted. Diffuse long segment wall thickening and edema with associated inflammatory fat stranding is seen within the descending colon from the level of the splenic flexure to the level of the sigmoid colon. Findings are most compatible with colitis. Scattered fluid seen along the left pericolic gutter. No free air identified to suggest perforation. Scattered colonic diverticula are also noted within the descending  colon. Transverse and ascending colon are within normal limits. Small bowel is unremarkable. Appendix is not visualized, compatible with prior appendectomy.  Bilateral fat containing inguinal hernias are noted, left greater than right.  Bladder is well distended grossly normal. Prostate is enlarged measuring up to 5.3 cm in diameter.  No pathologically enlarged intra-abdominal or pelvic lymph nodes are identified. Scattered calcified atheromatous disease noted within the intra-abdominal aorta and its branch vessels. No focal lytic or blastic osseous lesions identified.  IMPRESSION: 1. Findings compatible with acute colitis involving the descending colon. No loculated fluid collection to suggest abscess or evidence of perforation identified. 2. Diverticulosis without acute diverticulitis. 3. Cholelithiasis without acute cholecystitis. 4. Small hiatal hernia   Electronically Signed   By: Rise Mu M.D.   On: 03/07/2013 04:39    Microbiology: Recent Results (from  the past 240 hour(s))  CLOSTRIDIUM DIFFICILE BY PCR     Status: None   Collection Time    03/07/13  7:56 AM      Result Value Range Status   C difficile by pcr NEGATIVE  NEGATIVE Final   Comment: Performed at [facility]     Labs: Basic Metabolic Panel:  Recent Labs Lab 03/06/13 0810 03/06/13 2335 03/07/13 0728  NA 136 137 138  K 4.7 3.7 3.9  CL 100 103 105  CO2 26 23 26   GLUCOSE 118* 119* 113*  BUN 20 14 9   CREATININE 0.75 0.66 0.74  CALCIUM 10.0 8.8 8.2*   Liver Function Tests:  Recent Labs Lab 03/06/13 0810 03/06/13 2335 03/07/13 0728  AST 54* 36 29  ALT 38 47 40  ALKPHOS 91 78 69  BILITOT 0.3 0.5 0.5  PROT 7.3 6.5 5.9*  ALBUMIN 3.9 3.5 3.1*   No results found for this basename: LIPASE, AMYLASE,  in the last 168 hours No results found for this basename: AMMONIA,  in the last 168 hours CBC:  Recent Labs Lab 03/06/13 0810 03/06/13 2335 03/07/13 0728 03/07/13 1308 03/07/13 1656  WBC 10.9* 7.4 6.9 7.0 8.1  NEUTROABS 9.8*  --   --   --   --   HGB 15.8 13.8 13.2 13.6 13.7  HCT 44.6 40.2 37.9* 39.0 39.9  MCV 93.1 92.4 93.6 93.5 93.9  PLT 125* 121* 121* 118* 130*   Cardiac Enzymes: No results found for this basename: CKTOTAL, CKMB, CKMBINDEX, TROPONINI,  in the last 168 hours BNP: BNP (last 3 results) No results found for this basename: PROBNP,  in the last 8760 hours CBG:  Recent Labs Lab 03/07/13 1148 03/07/13 1633 03/07/13 2049 03/08/13 0748 03/08/13 1120  GLUCAP 81 113* 140* 106* 77       Signed:  HERNANDEZ ACOSTA,ESTELA  [facility] Pager: [phone] 03/08/2013, 4:08 PM

## 2013-03-08 NOTE — Progress Notes (Signed)
[facility] PROGRESS NOTE  Larry [name] ZOX:[id number] DOB: 1930/02/04 DOA: 03/06/2013 PCP: Crawford Givens, MD  Brief history 79 year old male with a history of hypothyroidism, nonspecific dermatitis, and hypertriglyceridemia presents with one-day history of rectal bleeding. This started on the morning of 03/05/2013 associated with some abdominal cramping. He had 5 episodes of rectal bleeding. He came to the emergency department. His hemoglobin was stable. He was sent home. When he got home, he continued to have 3-4 loose bowel movements with bleeding. He came to the emergency department. His hemoglobin remained stable. He remained hemodynamically stable. He denies any trauma or undercooked foods or recent travels. No sick contacts. He denies any claudication, dizziness, nausea, vomiting, fevers, chills. Assessment/Plan: GI bleed -Hemoglobin is stable -Likely ischemic/infectious colitis versus diverticular bleed -CT abdomen pelvis shows descending colon inflammatory changes -I discussed case with Dr. Ewing Schlein [facility])--he stated to continue current course of treatment and advance diet. -if bleeding does not decrease or clinically worsens, he will be happy to consult -Continue ciprofloxacin and Flagyl, but change to PO. -Advance diet. -Avoid NSAIDs -Monitor Hgb -INR 0.98, PTT 27  Hypothyroidism -Continue Synthroid  Thrombocytopenia -Appears to be chronic -Partly due to methotrexate  Nonspecific dermatitis -Hold methotrexate for now -Has been taking methotrexate for 2 months  Hypertriglyceridemia -Hold fish oill for now  Family Communication:   No family at beside. Patient only. Disposition Plan:   Likely Dc home later today vs in am if tolerates diet. Patient hesitant to leave today.   Antibiotics:  Ciprofloxacin 03/07/2013>>>  Flagyl 03/07/2013>>>    Procedures/Studies: Ct Abdomen Pelvis W Contrast  03/07/2013   CLINICAL DATA:  Rectal bleeding, diarrhea  EXAM:  CT ABDOMEN AND PELVIS WITH CONTRAST  TECHNIQUE: Multidetector CT imaging of the abdomen and pelvis was performed using the standard protocol following bolus administration of intravenous contrast.  CONTRAST:  OMNIPAQUE IOHEXOL 300 MG/ML  SOLN  COMPARISON:  None.  FINDINGS: Linear opacity within the visualized lung bases is most compatible with atelectasis and/or scarring. Lungs are otherwise unremarkable.  The liver demonstrates a normal contrast enhanced appearance. Scattered calcified stones are noted within the gallbladder lumen. New CT evidence of acute cholecystitis. The spleen, adrenal glands, and pancreas are within normal limits.  1.5 cm simple cyst is noted within the upper pole of the right kidney. Additional scattered subcentimeter hypodensities within both kidneys are too small the characterize by CT, but statistically likely represent cysts. No nephrolithiasis, hydronephrosis, or focal enhancing renal mass.  There is no evidence of bowel obstruction. Small hiatal hernia is noted. Diffuse long segment wall thickening and edema with associated inflammatory fat stranding is seen within the descending colon from the level of the splenic flexure to the level of the sigmoid colon. Findings are most compatible with colitis. Scattered fluid seen along the left pericolic gutter. No free air identified to suggest perforation. Scattered colonic diverticula are also noted within the descending colon. Transverse and ascending colon are within normal limits. Small bowel is unremarkable. Appendix is not visualized, compatible with prior appendectomy.  Bilateral fat containing inguinal hernias are noted, left greater than right.  Bladder is well distended grossly normal. Prostate is enlarged measuring up to 5.3 cm in diameter.  No pathologically enlarged intra-abdominal or pelvic lymph nodes are identified. Scattered calcified atheromatous disease noted within the intra-abdominal aorta and its branch vessels. No  focal lytic or blastic osseous lesions identified.  IMPRESSION: 1. Findings compatible with acute colitis involving the descending colon. No loculated fluid collection to  suggest abscess or evidence of perforation identified. 2. Diverticulosis without acute diverticulitis. 3. Cholelithiasis without acute cholecystitis. 4. Small hiatal hernia   Electronically Signed   By: Rise Mu M.D.   On: 03/07/2013 04:39         Subjective: Patient states her abdominal pain is better today but is still having a little abdominal cramping. Denies any fevers, chills, chest discomfort, nausea, vomiting, diarrhea, abdominal pain.  Objective: Filed Vitals:   03/07/13 0510 03/07/13 1340 03/07/13 2050 03/08/13 0458  BP: 149/75 127/73 127/67 120/65  Pulse: 79 75 74 64  Temp: 98.6 F (37 C) 98.3 F (36.8 C) 98.3 F (36.8 C) 98.1 F (36.7 C)  TempSrc: Oral Oral Oral Oral  Resp: 18 18 18 18   Height: 5\' 5"  (1.651 m)     Weight: 67.2 kg (148 lb 2.4 oz)     SpO2: 100% 100% 96% 97%    Intake/Output Summary (Last 24 hours) at 03/08/13 1114 Last data filed at 03/08/13 4098  Gross per 24 hour  Intake   2980 ml  Output      6 ml  Net   2974 ml   Weight change:  Exam:   General:  Pt is alert, follows commands appropriately, not in acute distress  HEENT: No icterus, No thrush, [place]/AT  Cardiovascular: RRR, S1/S2, no rubs, no gallops  Respiratory: CTA bilaterally, no wheezing, no crackles, no rhonchi  Abdomen: Soft/+BS, non tender, non distended, no guarding  Extremities: No edema, No lymphangitis, No petechiae, No rashes, no synovitis  Data Reviewed: Basic Metabolic Panel:  Recent Labs Lab 03/06/13 0810 03/06/13 2335 03/07/13 0728  NA 136 137 138  K 4.7 3.7 3.9  CL 100 103 105  CO2 26 23 26   GLUCOSE 118* 119* 113*  BUN 20 14 9   CREATININE 0.75 0.66 0.74  CALCIUM 10.0 8.8 8.2*   Liver Function Tests:  Recent Labs Lab 03/06/13 0810 03/06/13 2335 03/07/13 0728  AST 54*  36 29  ALT 38 47 40  ALKPHOS 91 78 69  BILITOT 0.3 0.5 0.5  PROT 7.3 6.5 5.9*  ALBUMIN 3.9 3.5 3.1*   No results found for this basename: LIPASE, AMYLASE,  in the last 168 hours No results found for this basename: AMMONIA,  in the last 168 hours CBC:  Recent Labs Lab 03/06/13 0810 03/06/13 2335 03/07/13 0728 03/07/13 1308 03/07/13 1656  WBC 10.9* 7.4 6.9 7.0 8.1  NEUTROABS 9.8*  --   --   --   --   HGB 15.8 13.8 13.2 13.6 13.7  HCT 44.6 40.2 37.9* 39.0 39.9  MCV 93.1 92.4 93.6 93.5 93.9  PLT 125* 121* 121* 118* 130*   Cardiac Enzymes: No results found for this basename: CKTOTAL, CKMB, CKMBINDEX, TROPONINI,  in the last 168 hours BNP: No components found with this basename: POCBNP,  CBG:  Recent Labs Lab 03/07/13 0752 03/07/13 1148 03/07/13 1633 03/07/13 2049 03/08/13 0748  GLUCAP 101* 81 113* 140* 106*    Recent Results (from the past 240 hour(s))  CLOSTRIDIUM DIFFICILE BY PCR     Status: None   Collection Time    03/07/13  7:56 AM      Result Value Range Status   C difficile by pcr NEGATIVE  NEGATIVE Final   Comment: Performed at [facility]     Scheduled Meds: . ciprofloxacin  400 mg Intravenous Q12H  . HYDROcodone-acetaminophen  1 tablet Oral Once  . insulin aspart  0-9 Units Subcutaneous TID  WC  . levothyroxine  25 mcg Intravenous Daily  . metronidazole  500 mg Intravenous Q8H  . pantoprazole (PROTONIX) IV  40 mg Intravenous Q12H  . sodium chloride  3 mL Intravenous Q12H   Continuous Infusions:     Chaya Jan, MD  [facility] Pager [phone]  If 7PM-7AM, please contact night-coverage [email or web address] Password [email or web address] 03/08/2013, 11:14 AM   LOS: 2 days

## 2013-03-12 DIAGNOSIS — Z86010 Personal history of colonic polyps: Secondary | ICD-10-CM | POA: Diagnosis not present

## 2013-03-12 DIAGNOSIS — K5289 Other specified noninfective gastroenteritis and colitis: Secondary | ICD-10-CM | POA: Diagnosis not present

## 2013-05-06 ENCOUNTER — Other Ambulatory Visit: Payer: Self-pay | Admitting: Family Medicine

## 2013-05-14 DIAGNOSIS — K921 Melena: Secondary | ICD-10-CM | POA: Diagnosis not present

## 2013-05-14 LAB — HM COLONOSCOPY

## 2013-05-20 DIAGNOSIS — L570 Actinic keratosis: Secondary | ICD-10-CM | POA: Diagnosis not present

## 2013-05-20 DIAGNOSIS — Z79899 Other long term (current) drug therapy: Secondary | ICD-10-CM | POA: Diagnosis not present

## 2013-05-20 DIAGNOSIS — L259 Unspecified contact dermatitis, unspecified cause: Secondary | ICD-10-CM | POA: Diagnosis not present

## 2013-06-04 ENCOUNTER — Encounter: Payer: Self-pay | Admitting: Family Medicine

## 2013-08-21 ENCOUNTER — Other Ambulatory Visit: Payer: Self-pay | Admitting: Family Medicine

## 2013-09-12 ENCOUNTER — Ambulatory Visit (INDEPENDENT_AMBULATORY_CARE_PROVIDER_SITE_OTHER): Payer: Medicare Other | Admitting: Family Medicine

## 2013-09-12 ENCOUNTER — Encounter: Payer: Self-pay | Admitting: Family Medicine

## 2013-09-12 VITALS — BP 120/66 | HR 63 | Temp 98.2°F | Ht 64.75 in | Wt 150.0 lb

## 2013-09-12 DIAGNOSIS — S30860A Insect bite (nonvenomous) of lower back and pelvis, initial encounter: Secondary | ICD-10-CM | POA: Diagnosis not present

## 2013-09-12 DIAGNOSIS — W57XXXA Bitten or stung by nonvenomous insect and other nonvenomous arthropods, initial encounter: Principal | ICD-10-CM

## 2013-09-12 NOTE — Progress Notes (Signed)
Pre visit review using our clinic review tool, if applicable. No additional management support is needed unless otherwise documented below in the visit note. 

## 2013-09-12 NOTE — Progress Notes (Signed)
[place] [address] Phone: [phone] Fax: [phone]  Patient ID: Larry [name] MRN: [id number], DOB: Jan 19, 1930, 80 y.o. Date of Encounter: 09/12/2013  Primary Physician:  Elsie Stain, MD   Chief Complaint: Infected Tick Bie   Subjective:   History of Present Illness:  Larry [name] is a 80 y.o. very pleasant male patient on MTX who presents with the following:  Went to dermatologist, Dr. Damita Dunnings, and then went to the allergy clinic and Dr. Robin Searing at [facility] - does not know what is causing on it.   Got a tick and it started itching. Thought ? Infected, but getting better the last couple of days  Itching has come back a little bit. One day felt achy and not all that well and then felt well.     Past Medical History, Surgical History, Social History, Family History, Problem List, Medications, and Allergies have been reviewed and updated if relevant.  Review of Systems:  ROS: GEN: Acute illness details above GI: Tolerating PO intake GU: maintaining adequate hydration and urination Pulm: No SOB Interactive and getting along well at home.  Otherwise, ROS is as per the HPI.   Objective:   Physical Examination: BP 120/66  Pulse 63  Temp(Src) 98.2 F (36.8 C) (Oral)  Ht 5' 4.75" (1.645 m)  Wt 150 lb (68.04 kg)  BMI 25.14 kg/m2   GEN: WDWN, NAD, Non-toxic, A & O x 3 HEENT: Atraumatic, Normocephalic. Neck supple. No masses, No LAD. Ears and Nose: No external deformity. CV: RRR, No M/G/R. No JVD. No thrill. No extra heart sounds. PULM: CTA B, no wheezes, crackles, rhonchi. No retractions. No resp. distress. No accessory muscle use. EXTR: No c/c/e NEURO Normal gait.  PSYCH: Normally interactive. Conversant. Not depressed or anxious appearing.  Calm demeanor.   Laboratory and Imaging Data:  Assessment & Plan:   Tick bite of back, initial encounter  DOI approx 09/07/2013  No systemic sx. Site of bite resolving and  improving.  Systemic sx, then doxy, for now reassurance only.  Follow-up: No Follow-up on file. Unless noted above, the patient is to follow-up if symptoms worsen. Red flags were reviewed with the patient.  Signed,  Maud Deed. Braley Luckenbaugh, MD, CAQ Sports Medicine   Discontinued Medications   CIPROFLOXACIN (CIPRO) 250 MG TABLET    Take 1 tablet (250 mg total) by mouth 2 (two) times daily. For 14 days   LEVOTHYROXINE (SYNTHROID, LEVOTHROID) 50 MCG TABLET    Take 50 mcg by mouth daily before breakfast.   METRONIDAZOLE (FLAGYL) 500 MG TABLET    Take 1 tablet (500 mg total) by mouth 3 (three) times daily. For 14 days   ONDANSETRON (ZOFRAN) 4 MG TABLET    Take 1 tablet (4 mg total) by mouth every 6 (six) hours as needed for nausea.   Current Medications at Discharge:   Medication List       This list is accurate as of: 09/12/13  5:57 PM.  Always use your most recent med list.               ALPRAZolam 0.5 MG tablet  Commonly known as:  XANAX  Take 0.5 mg by mouth at bedtime as needed for anxiety (For MRI or root canals).     calcium carbonate 500 MG chewable tablet  Commonly known as:  TUMS - dosed in mg elemental calcium  Chew 1 tablet by mouth daily as needed for indigestion or heartburn.     CO  Q 10 PO  Take 1 capsule by mouth daily.     FISH OIL PO  Take 1 tablet by mouth daily.     KRILL OIL PO  Take 1 tablet by mouth daily.     methotrexate 2.5 MG tablet  Commonly known as:  RHEUMATREX  - Take 12.5 mg by mouth once a week. Takes every Thursday, tech states patient received 12/11  - Caution:Chemotherapy. Protect from light.     multivitamin with minerals tablet  Take 1 tablet by mouth daily.     RED YEAST RICE PO  Take 1 tablet by mouth daily.     SYNTHROID 50 MCG tablet  Generic drug:  levothyroxine  TAKE 1 TABLET (50 MCG TOTAL) BY MOUTH DAILY.

## 2013-09-16 DIAGNOSIS — L259 Unspecified contact dermatitis, unspecified cause: Secondary | ICD-10-CM | POA: Diagnosis not present

## 2013-09-16 DIAGNOSIS — Z79899 Other long term (current) drug therapy: Secondary | ICD-10-CM | POA: Diagnosis not present

## 2013-09-18 ENCOUNTER — Ambulatory Visit (INDEPENDENT_AMBULATORY_CARE_PROVIDER_SITE_OTHER): Payer: Medicare Other | Admitting: Family Medicine

## 2013-09-18 ENCOUNTER — Encounter: Payer: Self-pay | Admitting: Family Medicine

## 2013-09-18 VITALS — BP 120/70 | HR 66 | Temp 98.1°F | Wt 151.0 lb

## 2013-09-18 DIAGNOSIS — M7989 Other specified soft tissue disorders: Secondary | ICD-10-CM | POA: Insufficient documentation

## 2013-09-18 DIAGNOSIS — R609 Edema, unspecified: Secondary | ICD-10-CM | POA: Diagnosis not present

## 2013-09-18 DIAGNOSIS — M79669 Pain in unspecified lower leg: Secondary | ICD-10-CM

## 2013-09-18 DIAGNOSIS — M79609 Pain in unspecified limb: Secondary | ICD-10-CM | POA: Diagnosis not present

## 2013-09-18 DIAGNOSIS — R600 Localized edema: Secondary | ICD-10-CM

## 2013-09-18 LAB — D-DIMER, QUANTITATIVE: D-Dimer, Quant: 0.35 ug/mL-FEU (ref 0.00–0.48)

## 2013-09-18 NOTE — Progress Notes (Signed)
Pre visit review using our clinic review tool, if applicable. No additional management support is needed unless otherwise documented below in the visit note.  He thought he pulled a muscle in his R calf.  With prolonged driving prev- in years past and before cruise control- he would get some R calf pain and some ankle edema.  It would eventually resolve.  This time noted the swelling about 5 days ago, when his shoe was getting tight.  Some R lower leg swelling in the meantime. No L leg sx.  It is some better in the meantime.  No CP. No SOB.  No h/o DVT, no FH DVT.  No period of inactivity.  Pain is better now, never had much pain to begin with.  No bruising.   He has been building a shop, putting up [employer] and "I think I pulled something."    Meds, vitals, and allergies reviewed.   ROS: See HPI.  Otherwise, noncontributory.  nad rrr ctab R medial calf ttp, sore with heel raises.  No deficit of ROM.   R lower leg with 1+ edema.  No bruising.  R calf 37cm, L calf 35cm

## 2013-09-18 NOTE — Assessment & Plan Note (Addendum)
D dimer pending.  If neg, likely strain and should resolve.  If pos, would arrange for u/s. D/w pt.  Awaiting lab result.   Addendum.  D dimer neg, likely strain, see notes on labs.

## 2013-09-18 NOTE — Patient Instructions (Signed)
Go to the lab on the way out.  We'll contact you with your lab report. Take care.

## 2013-09-21 ENCOUNTER — Other Ambulatory Visit: Payer: Self-pay | Admitting: Family Medicine

## 2013-09-21 DIAGNOSIS — E039 Hypothyroidism, unspecified: Secondary | ICD-10-CM

## 2013-09-21 DIAGNOSIS — D696 Thrombocytopenia, unspecified: Secondary | ICD-10-CM

## 2013-09-21 DIAGNOSIS — E785 Hyperlipidemia, unspecified: Secondary | ICD-10-CM

## 2013-09-25 ENCOUNTER — Other Ambulatory Visit (INDEPENDENT_AMBULATORY_CARE_PROVIDER_SITE_OTHER): Payer: Medicare Other

## 2013-09-25 DIAGNOSIS — D696 Thrombocytopenia, unspecified: Secondary | ICD-10-CM | POA: Diagnosis not present

## 2013-09-25 DIAGNOSIS — E039 Hypothyroidism, unspecified: Secondary | ICD-10-CM | POA: Diagnosis not present

## 2013-09-25 DIAGNOSIS — E785 Hyperlipidemia, unspecified: Secondary | ICD-10-CM | POA: Diagnosis not present

## 2013-09-25 LAB — COMPREHENSIVE METABOLIC PANEL
ALBUMIN: 3.8 g/dL (ref 3.5–5.2)
ALT: 22 U/L (ref 0–53)
AST: 25 U/L (ref 0–37)
Alkaline Phosphatase: 54 U/L (ref 39–117)
BUN: 19 mg/dL (ref 6–23)
CO2: 28 mEq/L (ref 19–32)
Calcium: 8.5 mg/dL (ref 8.4–10.5)
Chloride: 104 mEq/L (ref 96–112)
Creatinine, Ser: 0.8 mg/dL (ref 0.4–1.5)
GFR: 94.25 mL/min (ref >60.00)
Glucose, Bld: 105 mg/dL — ABNORMAL HIGH (ref 70–99)
POTASSIUM: 4.1 meq/L (ref 3.5–5.1)
Sodium: 137 mEq/L (ref 135–145)
Total Bilirubin: 0.5 mg/dL (ref 0.2–1.2)
Total Protein: 6.4 g/dL (ref 6.0–8.3)

## 2013-09-25 LAB — CBC WITH DIFFERENTIAL/PLATELET
BASOS ABS: 0 10*3/uL (ref 0.0–0.1)
Basophils Relative: 1.1 % (ref 0.0–3.0)
EOS ABS: 0.1 10*3/uL (ref 0.0–0.7)
Eosinophils Relative: 2.9 % (ref 0.0–5.0)
HEMATOCRIT: 43.4 % (ref 39.0–52.0)
HEMOGLOBIN: 15 g/dL (ref 13.0–17.0)
LYMPHS ABS: 1.2 10*3/uL (ref 0.7–4.0)
Lymphocytes Relative: 30.1 % (ref 12.0–46.0)
MCHC: 34.7 g/dL (ref 30.0–36.0)
MCV: 95.7 fl (ref 78.0–100.0)
Monocytes Absolute: 0.3 10*3/uL (ref 0.1–1.0)
Monocytes Relative: 6.4 % (ref 3.0–12.0)
NEUTROS ABS: 2.3 10*3/uL (ref 1.4–7.7)
Neutrophils Relative %: 59.5 % (ref 43.0–77.0)
Platelets: 133 10*3/uL — ABNORMAL LOW (ref 150.0–400.0)
RBC: 4.53 Mil/uL (ref 4.22–5.81)
RDW: 14.3 % (ref 11.5–15.5)
WBC: 3.9 10*3/uL — ABNORMAL LOW (ref 4.0–10.5)

## 2013-09-25 LAB — LIPID PANEL
CHOL/HDL RATIO: 5
CHOLESTEROL: 202 mg/dL — AB (ref 0–200)
HDL: 38.9 mg/dL — ABNORMAL LOW (ref >39.00)
NonHDL: 163.1
Triglycerides: 538 mg/dL — ABNORMAL HIGH (ref 0.0–149.0)
VLDL: 107.6 mg/dL — ABNORMAL HIGH (ref 0.0–40.0)

## 2013-09-25 LAB — TSH: TSH: 1.85 u[IU]/mL (ref 0.35–4.50)

## 2013-10-02 ENCOUNTER — Encounter: Payer: Self-pay | Admitting: Family Medicine

## 2013-10-02 ENCOUNTER — Ambulatory Visit (INDEPENDENT_AMBULATORY_CARE_PROVIDER_SITE_OTHER): Payer: Medicare Other | Admitting: Family Medicine

## 2013-10-02 VITALS — BP 116/70 | HR 60 | Temp 98.3°F | Ht 64.25 in | Wt 147.5 lb

## 2013-10-02 DIAGNOSIS — E039 Hypothyroidism, unspecified: Secondary | ICD-10-CM

## 2013-10-02 DIAGNOSIS — E785 Hyperlipidemia, unspecified: Secondary | ICD-10-CM | POA: Diagnosis not present

## 2013-10-02 DIAGNOSIS — L309 Dermatitis, unspecified: Secondary | ICD-10-CM

## 2013-10-02 DIAGNOSIS — M79669 Pain in unspecified lower leg: Secondary | ICD-10-CM

## 2013-10-02 DIAGNOSIS — Z7189 Other specified counseling: Secondary | ICD-10-CM

## 2013-10-02 DIAGNOSIS — Z0000 Encounter for general adult medical examination without abnormal findings: Secondary | ICD-10-CM | POA: Diagnosis not present

## 2013-10-02 DIAGNOSIS — D696 Thrombocytopenia, unspecified: Secondary | ICD-10-CM | POA: Diagnosis not present

## 2013-10-02 DIAGNOSIS — K625 Hemorrhage of anus and rectum: Secondary | ICD-10-CM

## 2013-10-02 MED ORDER — SYNTHROID 50 MCG PO TABS: ORAL_TABLET | ORAL | Status: DC

## 2013-10-02 NOTE — Patient Instructions (Signed)
Check and see if the tetanus shot is cheaper at the pharmacy.   Take care. Glad to see you.

## 2013-10-02 NOTE — Progress Notes (Signed)
Pre visit review using our clinic review tool, if applicable. No additional management support is needed unless otherwise documented below in the visit note.  I have personally reviewed the Medicare Annual Wellness questionnaire and have noted 1. The patient's medical and social history 2. Their use of alcohol, tobacco or illicit drugs 3. Their current medications and supplements 4. The patient's functional ability including ADL's, fall risks, home safety risks and hearing or visual             impairment. 5. Diet and physical activities 6. Evidence for depression or mood disorders  The patients weight, height, BMI have been recorded in the chart and visual acuity is per eye clinic.  I have made referrals, counseling and provided education to the patient based review of the above and I have provided the pt with a written personalized care plan for preventive services.  Provider list updated- see scanned forms.  Routine anticipatory guidance given to patient.  See health maintenance.  Flu 2014 Shingles 2009 PNA 2003 Tetanus 2004- d/w pt.  Colonoscopy 2015 Prostate cancer screening declined due to his age.  This is reasonable. We talked about pros and cons of testing. Advance directive- wife designated if patient were incapacitated.   Cognitive function addressed- see scanned forms- and if abnormal then additional documentation follows.   Hypothyroidism.  TSH wnl.  Compliant. No ADE on meds. No neck mass.  HLD. . TG up.  This is a persistent issue.  Labs reviewed with patient. Statin intolerant.  On MTX. No cardiac hx.    Dermatitis per [facility], on MTX.  Much improved. In midst of MTX taper.    PMH and SH reviewed  Meds, vitals, and allergies reviewed.   ROS: See HPI.  Otherwise negative.    GEN: nad, alert and oriented HEENT: mucous membranes moist NECK: supple w/o LA, no TMG CV: rrr. PULM: ctab, no inc wob ABD: soft, +bs EXT: no edema on L calf, trace edema on R calf SKIN: no  acute rash

## 2013-10-03 DIAGNOSIS — Z7189 Other specified counseling: Secondary | ICD-10-CM | POA: Insufficient documentation

## 2013-10-03 NOTE — Assessment & Plan Note (Signed)
No cardiac hx, on MTX, statin intolerant.  I would not like to add meds at this point, d/w pt.  He agrees. Continue healthy diet and exercise. He has minimal sugar elevation, wouldn't intervene with meds at this point.

## 2013-10-03 NOTE — Assessment & Plan Note (Signed)
Flu 2014  Shingles 2009  PNA 2003  Tetanus 2004- d/w pt.  Colonoscopy 2015  Prostate cancer screening declined due to his age. This is reasonable. We talked about pros and cons of testing.  Advance directive- wife designated if patient were incapacitated.   Cognitive function addressed- see scanned forms- and if abnormal then additional documentation follows.

## 2013-10-03 NOTE — Assessment & Plan Note (Signed)
Stable, no bleeding.  ?

## 2013-10-03 NOTE — Assessment & Plan Note (Signed)
tsh wnl, no ade on meds. Continue as is.  Labs d/w pt.

## 2013-10-03 NOTE — Assessment & Plan Note (Signed)
Improving, minimal edema now. D dimer prev neg.

## 2013-10-03 NOTE — Assessment & Plan Note (Signed)
No more bleeding note by patient in the meantime.

## 2013-10-07 DIAGNOSIS — H2510 Age-related nuclear cataract, unspecified eye: Secondary | ICD-10-CM | POA: Diagnosis not present

## 2013-11-04 ENCOUNTER — Encounter (INDEPENDENT_AMBULATORY_CARE_PROVIDER_SITE_OTHER): Payer: Self-pay

## 2013-11-04 ENCOUNTER — Encounter: Payer: Self-pay | Admitting: Family Medicine

## 2013-11-04 ENCOUNTER — Ambulatory Visit (INDEPENDENT_AMBULATORY_CARE_PROVIDER_SITE_OTHER): Payer: Medicare Other | Admitting: Family Medicine

## 2013-11-04 VITALS — BP 122/64 | HR 72 | Temp 98.0°F | Wt 148.2 lb

## 2013-11-04 DIAGNOSIS — R609 Edema, unspecified: Secondary | ICD-10-CM | POA: Diagnosis not present

## 2013-11-04 DIAGNOSIS — R600 Localized edema: Secondary | ICD-10-CM

## 2013-11-04 DIAGNOSIS — M7989 Other specified soft tissue disorders: Secondary | ICD-10-CM | POA: Diagnosis not present

## 2013-11-04 NOTE — Patient Instructions (Signed)
Try to keep you leg up as much as possible.   Rosaria Ferries will call about your referral. Take care.

## 2013-11-04 NOTE — Progress Notes (Signed)
Pre visit review using our clinic review tool, if applicable. No additional management support is needed unless otherwise documented below in the visit note.  Recurrent R leg swelling from the knee down. Not painful.  Prev with neg D dimer.  It has gotten better at last OV in mid 09/2013.  Not painful, but it gets tight.  The swelling is best early AM, worse as the day goes on.  Some tightness in the back of the knee.  Ibuprofen helps some.  No L leg edema. No CP, SOB.   Meds, vitals, and allergies reviewed.   ROS: See HPI.  Otherwise, noncontributory.  nad ncat Mmm rrr ctab Abd soft Ext with no edema on L leg, 1+ edema on R leg, calf circ 36 cm R, 35 cm L Normal cap refill.  Calf not ttp B

## 2013-11-05 NOTE — Assessment & Plan Note (Signed)
Prev pain resolved.  Now with recurrent edema on R leg.  Check u/s.  Not emergent.  Suspect vein incompetence, not DVT.  D/w pt.  Varicose veins -small- noted on L leg.

## 2013-11-16 ENCOUNTER — Other Ambulatory Visit: Payer: Self-pay | Admitting: Family Medicine

## 2013-11-19 ENCOUNTER — Encounter (INDEPENDENT_AMBULATORY_CARE_PROVIDER_SITE_OTHER): Payer: Medicare Other

## 2013-11-19 DIAGNOSIS — M7989 Other specified soft tissue disorders: Secondary | ICD-10-CM | POA: Diagnosis not present

## 2013-11-19 DIAGNOSIS — R600 Localized edema: Secondary | ICD-10-CM

## 2013-11-19 DIAGNOSIS — R609 Edema, unspecified: Secondary | ICD-10-CM

## 2013-11-21 ENCOUNTER — Other Ambulatory Visit: Payer: Self-pay | Admitting: *Deleted

## 2013-11-21 MED ORDER — SYNTHROID 50 MCG PO TABS: ORAL_TABLET | ORAL | Status: DC

## 2013-12-02 ENCOUNTER — Telehealth: Payer: Self-pay

## 2013-12-02 NOTE — Telephone Encounter (Signed)
Pt read article in paper about people need to get measles shot; pt request Dr Josefine Class opinion about him getting measles shot. Pt wants Dr Damita Dunnings to know rt knee is less swollen than when saw Dr Damita Dunnings in 10/2013 and pt has appt scheduled with Dr Gladstone Lighter on 12/13/13.

## 2013-12-03 NOTE — Telephone Encounter (Signed)
Thanks for the update on the knee. I'll await ortho input.   I looked back at the guidelines on the MMR.  Adults born before 76 shouldn't need a repeat MMR (except in rare circumstances, and those wouldn't apply to him).  Thanks.

## 2013-12-10 DIAGNOSIS — D485 Neoplasm of uncertain behavior of skin: Secondary | ICD-10-CM | POA: Diagnosis not present

## 2013-12-10 DIAGNOSIS — L821 Other seborrheic keratosis: Secondary | ICD-10-CM | POA: Diagnosis not present

## 2013-12-10 DIAGNOSIS — L570 Actinic keratosis: Secondary | ICD-10-CM | POA: Diagnosis not present

## 2013-12-10 DIAGNOSIS — Z85828 Personal history of other malignant neoplasm of skin: Secondary | ICD-10-CM | POA: Diagnosis not present

## 2013-12-10 DIAGNOSIS — R21 Rash and other nonspecific skin eruption: Secondary | ICD-10-CM | POA: Diagnosis not present

## 2013-12-10 DIAGNOSIS — D1801 Hemangioma of skin and subcutaneous tissue: Secondary | ICD-10-CM | POA: Diagnosis not present

## 2013-12-13 DIAGNOSIS — M25569 Pain in unspecified knee: Secondary | ICD-10-CM | POA: Diagnosis not present

## 2013-12-24 DIAGNOSIS — Z79899 Other long term (current) drug therapy: Secondary | ICD-10-CM | POA: Diagnosis not present

## 2013-12-24 DIAGNOSIS — L2089 Other atopic dermatitis: Secondary | ICD-10-CM | POA: Diagnosis not present

## 2014-01-16 ENCOUNTER — Ambulatory Visit (INDEPENDENT_AMBULATORY_CARE_PROVIDER_SITE_OTHER): Payer: Medicare Other

## 2014-01-16 DIAGNOSIS — Z23 Encounter for immunization: Secondary | ICD-10-CM | POA: Diagnosis not present

## 2014-02-17 DIAGNOSIS — L309 Dermatitis, unspecified: Secondary | ICD-10-CM | POA: Diagnosis not present

## 2014-02-17 DIAGNOSIS — D485 Neoplasm of uncertain behavior of skin: Secondary | ICD-10-CM | POA: Diagnosis not present

## 2014-02-17 DIAGNOSIS — Z85828 Personal history of other malignant neoplasm of skin: Secondary | ICD-10-CM | POA: Diagnosis not present

## 2014-02-17 DIAGNOSIS — C44311 Basal cell carcinoma of skin of nose: Secondary | ICD-10-CM | POA: Diagnosis not present

## 2014-02-17 DIAGNOSIS — L821 Other seborrheic keratosis: Secondary | ICD-10-CM | POA: Diagnosis not present

## 2014-03-12 DIAGNOSIS — C44311 Basal cell carcinoma of skin of nose: Secondary | ICD-10-CM | POA: Diagnosis not present

## 2014-03-12 DIAGNOSIS — Z85828 Personal history of other malignant neoplasm of skin: Secondary | ICD-10-CM | POA: Diagnosis not present

## 2014-03-31 DIAGNOSIS — Z79899 Other long term (current) drug therapy: Secondary | ICD-10-CM | POA: Diagnosis not present

## 2014-03-31 DIAGNOSIS — Z85828 Personal history of other malignant neoplasm of skin: Secondary | ICD-10-CM | POA: Diagnosis not present

## 2014-03-31 DIAGNOSIS — L309 Dermatitis, unspecified: Secondary | ICD-10-CM | POA: Diagnosis not present

## 2014-06-18 ENCOUNTER — Encounter: Payer: Self-pay | Admitting: Family Medicine

## 2014-06-18 ENCOUNTER — Telehealth: Payer: Self-pay | Admitting: Family Medicine

## 2014-06-18 ENCOUNTER — Ambulatory Visit (INDEPENDENT_AMBULATORY_CARE_PROVIDER_SITE_OTHER): Payer: Medicare Other | Admitting: Family Medicine

## 2014-06-18 VITALS — BP 124/70 | HR 64 | Temp 98.8°F | Ht 64.25 in | Wt 151.8 lb

## 2014-06-18 DIAGNOSIS — T7840XA Allergy, unspecified, initial encounter: Secondary | ICD-10-CM

## 2014-06-18 DIAGNOSIS — R197 Diarrhea, unspecified: Secondary | ICD-10-CM

## 2014-06-18 MED ORDER — EPINEPHRINE 0.3 MG/0.3ML IJ SOAJ
0.3000 mg | Freq: Once | INTRAMUSCULAR | Status: DC
Start: 2014-06-18 — End: 2015-08-18

## 2014-06-18 MED ORDER — DEXAMETHASONE SODIUM PHOSPHATE 10 MG/ML IJ SOLN
10.0000 mg | Freq: Once | INTRAMUSCULAR | Status: AC
  Administered 2014-06-18: 10 mg via INTRAMUSCULAR

## 2014-06-18 NOTE — Telephone Encounter (Signed)
Pt has appt today at 4 PM with Dr Lorelei Pont.

## 2014-06-18 NOTE — Telephone Encounter (Signed)
PLEASE NOTE: All timestamps contained within this report are represented as [place] Standard Time. CONFIDENTIALTY NOTICE: This fax transmission is intended only for the addressee. It contains information that is legally privileged, confidential or otherwise protected from use or disclosure. If you are not the intended recipient, you are strictly prohibited from reviewing, disclosing, copying using or disseminating any of this information or taking any action in reliance on or regarding this information. If you have received this fax in error, please notify us immediately by telephone so that we can arrange for its return to [place]. Phone: [phone], Toll-Free: [phone], Fax: [phone] Page: 1 of 2 Call Id: [id number] [place] Patient Name: Larry [name] Gender: Male DOB: 1929/03/29 Age: 81 Y 9 M 28 D Return Phone Number: [phone] (Primary) Address: [place]/State/Zip: [place] [address] Client [facility] Client Client Site [facility] Physician Renford Dills Contact Type Call Call Type Triage / Clinical Caller Name Jessi Jessop Relationship To Patient Spouse Appointment Disposition EMR Appointment Scheduled Info pasted into Epic Yes Return Phone Number [phone] (Primary) Chief Complaint Paging or Request for Consult Initial Comment Caller states husband had a bad reaction this am, 3 am, tongue and throat swelled, could hardly talk or couldn't see back of throat. called 911 , gave him an antacid and Benadryl and it did get better. No longer having problems after he took meds. Was cold and shaking real bad. Wants to let Dr know and to see if he should be seen. PreDisposition Call Doctor Nurse Assessment Nurse: Julien Girt, RN, Almyra Free Date/Time Eilene Ghazi Time): 06/18/2014 9:04:35 AM Confirm and document reason  for call. If symptomatic, describe symptoms. ---Caller states her husband had a bad reaction at 3 am, his tongue and throat swelled, he could hardly talk , she couldn't see back of throat and she called 911. EMS/paramedics gave him an antacid and Benadryl and he did get better. States he has rubbed cream/ triamcinolone acetonide( uses for his skin condition) on his hands at some point, not certain if it was prior to his reaction. He was shaking and cold, no fever. This morning he is tired, he has diarrhea but no respiratory sx. Has the patient traveled out of the country within the last 30 days? ---Not Applicable Does the patient require triage? ---Yes Related visit to physician within the last 2 weeks? ---No Does the PT have any chronic conditions? (i.e. diabetes, asthma, etc.) ---Yes List chronic conditions. ---"skin condition"/methotrexate/triamcinolone acetonide, hypothyroid, Guidelines Guideline Title Affirmed Question Affirmed Notes Nurse Date/Time (Eastern Time) Diarrhea Age > 52 years and Methotrexate Chancy Hurter 06/18/2014 9:11:36 AM PLEASE NOTE: All timestamps contained within this report are represented as [place] Standard Time. CONFIDENTIALTY NOTICE: This fax transmission is intended only for the addressee. It contains information that is legally privileged, confidential or otherwise protected from use or disclosure. If you are not the intended recipient, you are strictly prohibited from reviewing, disclosing, copying using or disseminating any of this information or taking any action in reliance on or regarding this information. If you have received this fax in error, please notify us immediately by telephone so that we can arrange for its return to [place]. Phone: [phone], Toll-Free: [phone], Fax: [phone] Page: 2 of 2 Call Id: [id number] [place]. Time Eilene Ghazi Time) Disposition Final User 06/18/2014 9:18:06 AM See Physician within 24 Hours Yes Julien Girt, Therapist, sports,  Almyra Free  Caller Understands: Yes Disagree/Comply: Comply Care Advice Given Per Guideline SEE PHYSICIAN WITHIN 24 HOURS: * Sip water or a sports - rehydration drink (Gatorade or Powerade) * Signs of dehydration occur (e.g., no urine over 12 hours, very dry mouth, lightheaded, etc.) * Bloody stools * Constant or severe abdominal pain * You become worse. CARE ADVICE given per Diarrhea (Adult) guideline. After Care Instructions Given Call Event Type User Date / Time Description Referrals REFERRED TO PCP OFFICE

## 2014-06-18 NOTE — Progress Notes (Signed)
Dr. Frederico Hamman T. Larenzo Caples, MD, [facility] [place], [address] Phone: [phone] Fax: [phone]  06/18/2014  Patient: Larry [name], MRN: [id number], DOB: 24-Dec-1929, 81 y.o.  Primary Physician:  Elsie Stain, MD  Chief Complaint: Diarrhea and Allergic Reaction  Subjective:   Larry [name] is a 81 y.o. very pleasant male patient who presents with the following:  Tongue swelled up twice its size and they called 911. 6 PM ate a frozen pizza and had some stomach upset and diarrhea this morning.   Feet and hands started itching. On MTX - ? Unclear cause.  Also has used some TAC 0.1%  Called 911. Took some benadryl. Then it started to get better.  History of urinary retention with anti-histamines.   He also had some diarrhea earlier this morning.   No prior h/o anaphylaxis  Past Medical History, Surgical History, Social History, Family History, Problem List, Medications, and Allergies have been reviewed and updated if relevant.   GEN: No acute illnesses, no fevers, chills. GI: No n/v/d, eating normally Pulm: No SOB Interactive and getting along well at home.  Otherwise, ROS is as per the HPI.  Objective:   BP 124/70 mmHg  Pulse 64  Temp(Src) 98.8 F (37.1 C) (Oral)  Ht 5' 4.25" (1.632 m)  Wt 151 lb 12 oz (68.833 kg)  BMI 25.84 kg/m2  GEN: WDWN, NAD, Non-toxic, A & O x 3 HEENT: Atraumatic, Normocephalic. Neck supple. No masses, No LAD. Ears and Nose: No external deformity. CV: RRR, No M/G/R. No JVD. No thrill. No extra heart sounds. PULM: CTA B, no wheezes, crackles, rhonchi. No retractions. No resp. distress. No accessory muscle use. EXTR: No c/c/e NEURO Normal gait.  PSYCH: Normally interactive. Conversant. Not depressed or anxious appearing.  Calm demeanor.   Laboratory and Imaging Data:  Assessment and Plan:   Allergic reaction, initial encounter - Plan: dexamethasone (DECADRON) injection  10 mg  Diarrhea  History of urinary retention with antihistamines. Indication of anaphylaxis, I think that we need to do something further to suppress his allergic response. Given 10 mg of Decadron in the office.  Diarrhea has resolved.  I do not see how there is any possible way that either topical triamcinolone or hydrocortisone could have caused this.  Follow-up: prn  New Prescriptions   EPINEPHRINE 0.3 MG/0.3 ML IJ SOAJ INJECTION    Inject 0.3 mLs (0.3 mg total) into the muscle once.   No orders of the defined types were placed in this encounter.    Signed,  Maud Deed. Lorean Ekstrand, MD   Patient's Medications  New Prescriptions   EPINEPHRINE 0.3 MG/0.3 ML IJ SOAJ INJECTION    Inject 0.3 mLs (0.3 mg total) into the muscle once.  Previous Medications   ALPRAZOLAM (XANAX) 0.5 MG TABLET    Take 0.5 mg by mouth daily as needed for anxiety (For MRI or root canals).    ASPIRIN 81 MG TABLET    Take 81 mg by mouth daily.   CALCIUM CARBONATE (TUMS - DOSED IN MG ELEMENTAL CALCIUM) 500 MG CHEWABLE TABLET    Chew 1 tablet by mouth daily as needed for indigestion or heartburn.   COENZYME Q10 (CO Q 10 PO)    Take 1 capsule by mouth daily.   FOLIC ACID (FOLVITE) 1 MG TABLET    Take 1 mg by mouth daily.   KRILL OIL PO    Take 1 tablet by mouth daily.   METHOTREXATE (  RHEUMATREX) 2.5 MG TABLET    Take 2.5 mg by mouth 2 (two) times a week.    MULTIPLE VITAMINS-MINERALS (MULTIVITAMIN WITH MINERALS) TABLET    Take 1 tablet by mouth daily.   OMEGA-3 FATTY ACIDS (FISH OIL PO)    Take 1 tablet by mouth daily.   RED YEAST RICE EXTRACT (RED YEAST RICE PO)    Take 1 tablet by mouth daily.   SYNTHROID 50 MCG TABLET    TAKE 1 TABLET BY MOUTH DAILY.   TRIAMCINOLONE CREAM (KENALOG) 0.1 %    Apply 1 application topically 2 (two) times daily.  Modified Medications   No medications on file  Discontinued Medications   No medications on file

## 2014-06-18 NOTE — Telephone Encounter (Signed)
[facility]     Patient Name: [facility] Initial Comment Caller states husband had a bad reaction this am, 3 am, tongue and throat swelled, could hardly talk or couldn't see back of throat. called 911 , gave him an antacid and Benadryl and it did get better. No longer having problems after he took meds. Was cold and shaking real bad. Wants to let Dr know and to see if he should be seen.   DOB: [birth year]      Nurse Assessment  Nurse: Julien Girt, RN, Almyra Free Date/Time Eilene Ghazi Time): 06/18/2014 9:04:35 AM  Confirm and document reason for call. If symptomatic, describe symptoms. ---Caller states her husband had a bad reaction at 3 am, his tongue and throat swelled, he could hardly talk , she couldn't see back of throat and she called 911. EMS/paramedics gave him an antacid and Benadryl and he did get better. States he has rubbed cream/triamcinolone acetonide( uses for his skin condition) on his hands at some point, not certain if it was prior to his reaction. He was shaking and cold, no fever. This morning he is tired, he has diarrhea but no respiratory sx.  Has the patient traveled out of the country within the last 30 days? ---Not Applicable  Does the patient require triage? ---Yes  Related visit to physician within the last 2 weeks? ---No  Does the PT have any chronic conditions? (i.e. diabetes, asthma, etc.) ---Yes  List chronic conditions. ---"skin condition"/methotrexate/triamcinolone acetonide, hypothyroid,    Guidelines     Guideline Title Affirmed Question Affirmed Notes   Diarrhea Age > 70 years and Methotrexate   Final Disposition User   See Physician within [address] Hours [place], Therapist, sports, [address]

## 2014-06-18 NOTE — Progress Notes (Signed)
Pre visit review using our clinic review tool, if applicable. No additional management support is needed unless otherwise documented below in the visit note. 

## 2014-06-24 ENCOUNTER — Other Ambulatory Visit: Payer: Self-pay | Admitting: *Deleted

## 2014-06-24 DIAGNOSIS — Z85828 Personal history of other malignant neoplasm of skin: Secondary | ICD-10-CM | POA: Diagnosis not present

## 2014-06-24 DIAGNOSIS — L2089 Other atopic dermatitis: Secondary | ICD-10-CM | POA: Diagnosis not present

## 2014-06-24 DIAGNOSIS — L821 Other seborrheic keratosis: Secondary | ICD-10-CM | POA: Diagnosis not present

## 2014-06-24 DIAGNOSIS — L309 Dermatitis, unspecified: Secondary | ICD-10-CM | POA: Diagnosis not present

## 2014-06-24 DIAGNOSIS — Z79899 Other long term (current) drug therapy: Secondary | ICD-10-CM | POA: Diagnosis not present

## 2014-06-24 NOTE — Telephone Encounter (Signed)
Patient called requesting a refill on his Alprazolam. Patient stated that this was given for him to take if he has to have any dental work done. Patient stated that he has not gotten this filled since 2013 and the pills in the bottle have expired. Patient stated that he does not use this very often, but likes to have some on hand. Pharmacy [facility]   Patient request a call back when this has been done.

## 2014-06-25 MED ORDER — ALPRAZOLAM 0.5 MG PO TABS: 0.5000 mg | ORAL_TABLET | Freq: Every day | ORAL | Status: DC | PRN

## 2014-06-25 NOTE — Telephone Encounter (Signed)
Please call in.  Thanks.   

## 2014-06-25 NOTE — Telephone Encounter (Signed)
Pt wants to know status of alprazolam refill; spoke with Heidi at [facility] and rx ready for pick up; pt notified and voiced understanding.

## 2014-06-25 NOTE — Telephone Encounter (Signed)
Rx called to pharmacy as instructed. Patient notified by telephone that script has been called in.

## 2014-07-15 ENCOUNTER — Telehealth: Payer: Self-pay | Admitting: Family Medicine

## 2014-07-15 DIAGNOSIS — Z889 Allergy status to unspecified drugs, medicaments and biological substances status: Secondary | ICD-10-CM

## 2014-07-15 NOTE — Telephone Encounter (Signed)
Pt called stating that he heard there was a test for deer tick bite.(alpha gal)  He said it was on Internet and he wanted to take this test because he has been taking methotrexate for a year  He stated he has had occasions where he eats beef and has a reaction.  Itching bottom of feet and palms of hand.  He stated last time they had to call ems because his tongue swelled

## 2014-07-16 NOTE — Telephone Encounter (Signed)
We should get him over to the allergy clinic.  That would be more useful than testing here.  I put in the referral.   Avoid beef for now.  If any return of tongue swelling, to ER.  Thanks.

## 2014-07-16 NOTE — Telephone Encounter (Signed)
Patient's wife notified as instructed by telephone and verbalized understanding. Patient's wife stated that he has seen an allergist previously, but does not remember the name of the doctor. Ms. Castellana said to go ahead and schedule with whoever Dr. Damita Dunnings would recommend. Advised patient's wife that the referral coordinator will be in touch to get this set up.

## 2014-07-24 DIAGNOSIS — D485 Neoplasm of uncertain behavior of skin: Secondary | ICD-10-CM | POA: Diagnosis not present

## 2014-07-24 DIAGNOSIS — L570 Actinic keratosis: Secondary | ICD-10-CM | POA: Diagnosis not present

## 2014-07-24 DIAGNOSIS — Z85828 Personal history of other malignant neoplasm of skin: Secondary | ICD-10-CM | POA: Diagnosis not present

## 2014-07-29 DIAGNOSIS — R21 Rash and other nonspecific skin eruption: Secondary | ICD-10-CM | POA: Diagnosis not present

## 2014-07-29 DIAGNOSIS — T783XXA Angioneurotic edema, initial encounter: Secondary | ICD-10-CM | POA: Diagnosis not present

## 2014-07-29 DIAGNOSIS — J3089 Other allergic rhinitis: Secondary | ICD-10-CM | POA: Diagnosis not present

## 2014-07-30 DIAGNOSIS — T783XXA Angioneurotic edema, initial encounter: Secondary | ICD-10-CM | POA: Diagnosis not present

## 2014-07-30 IMAGING — CR DG SHOULDER 2+V*R*
3 series · 3 of 3 positions shown · non-contrast
Comparison: None.

CLINICAL DATA: Shoulder pain

RIGHT SHOULDER - 2+ VIEW

[view not recorded (1 of 3)]
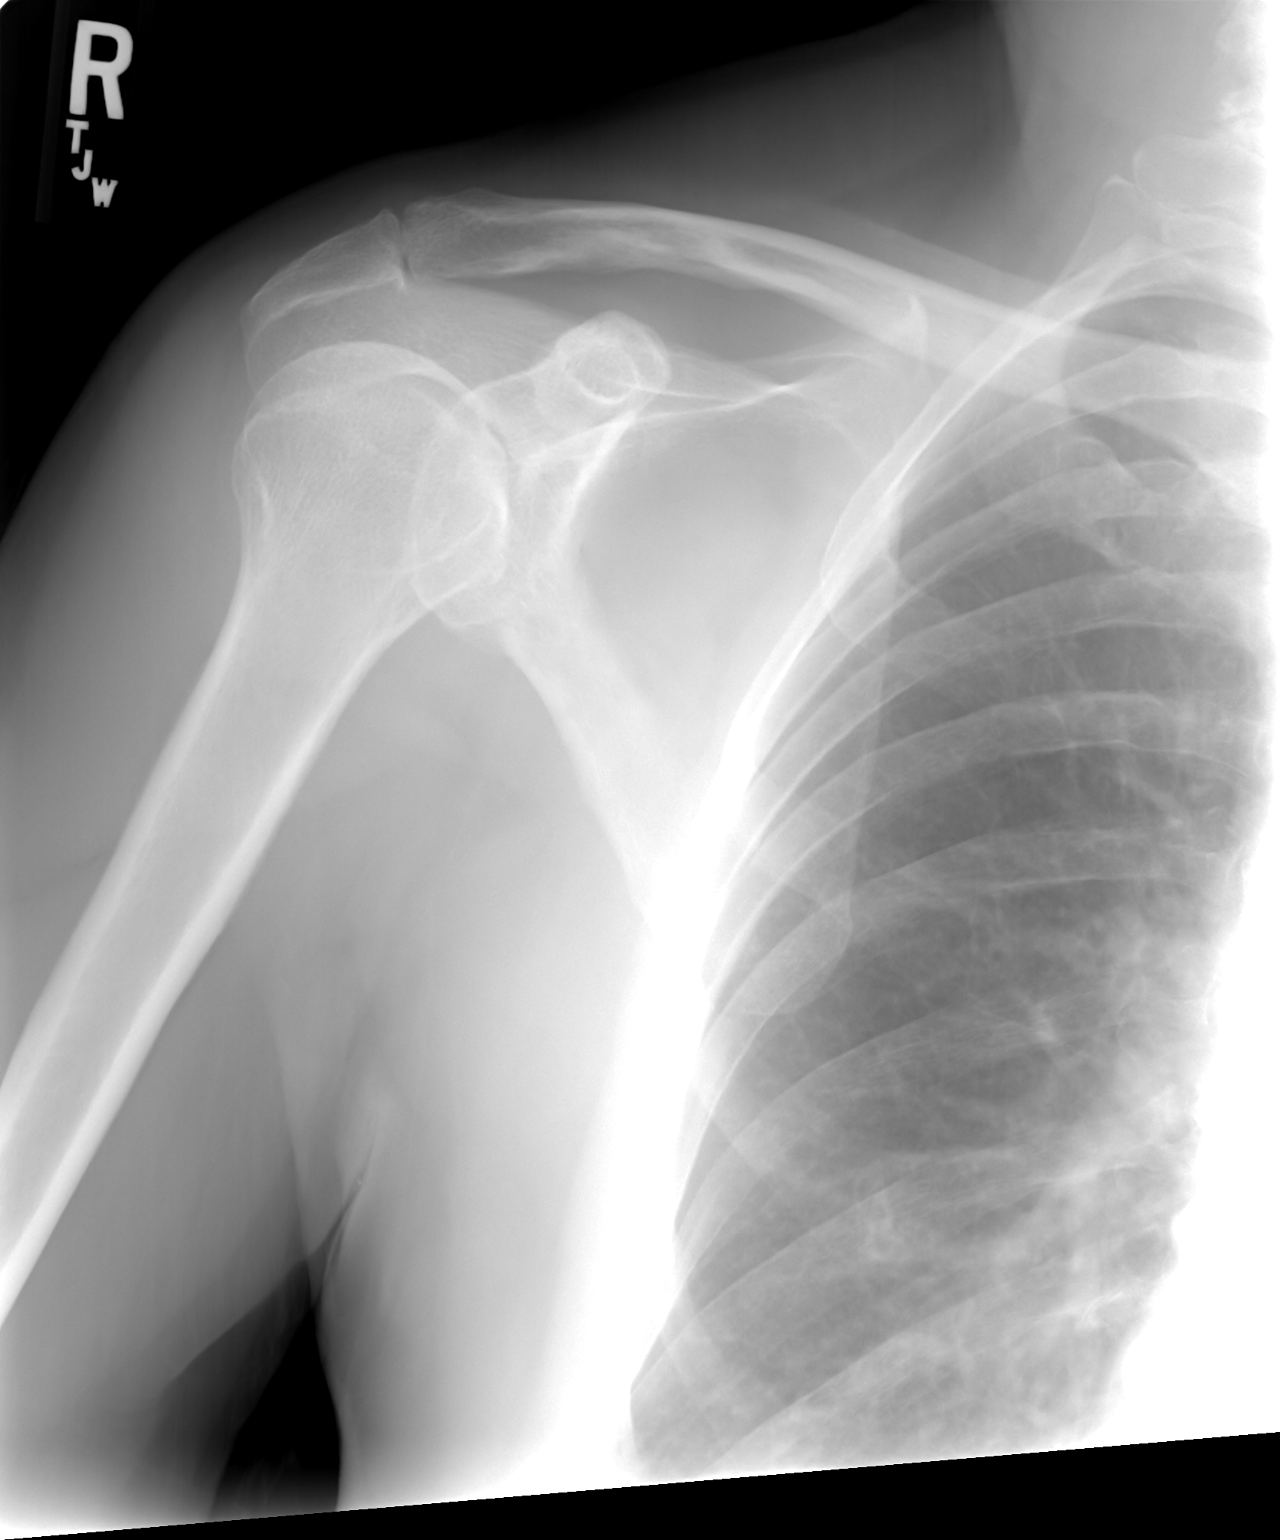

[view not recorded (2 of 3)]
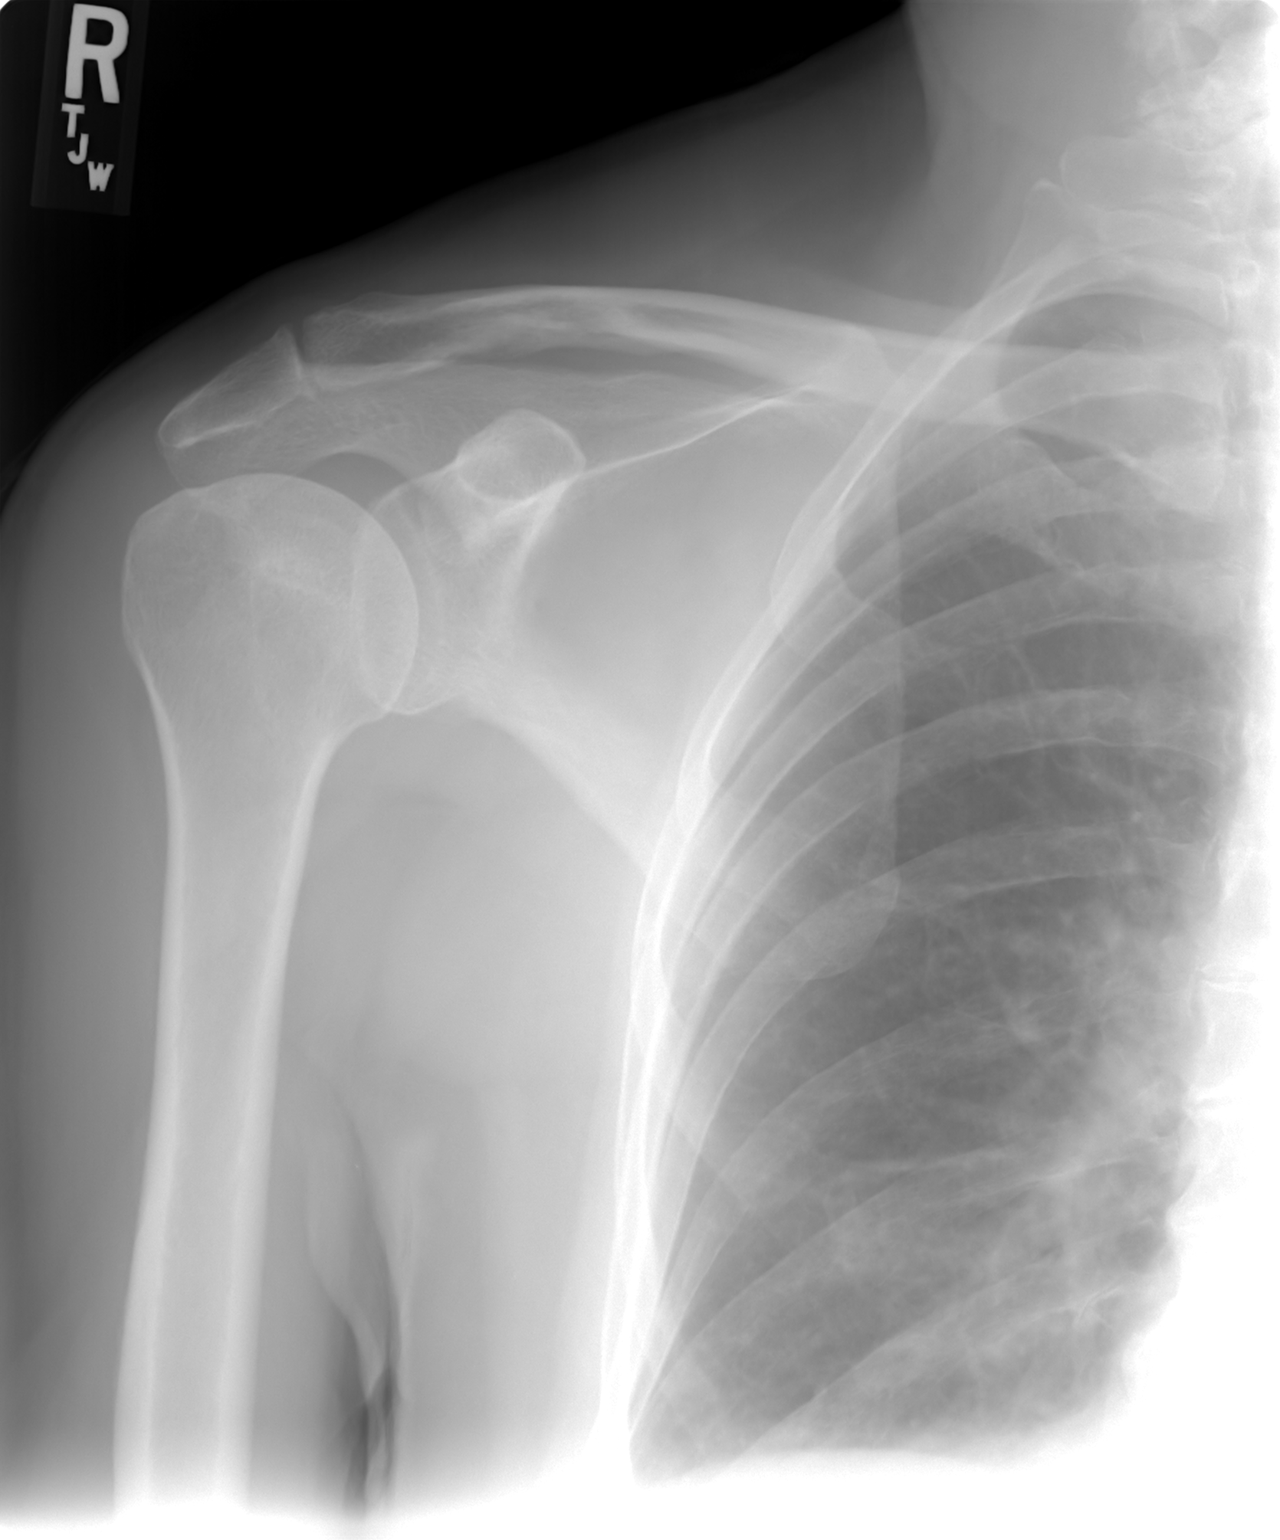

[view not recorded (3 of 3)]
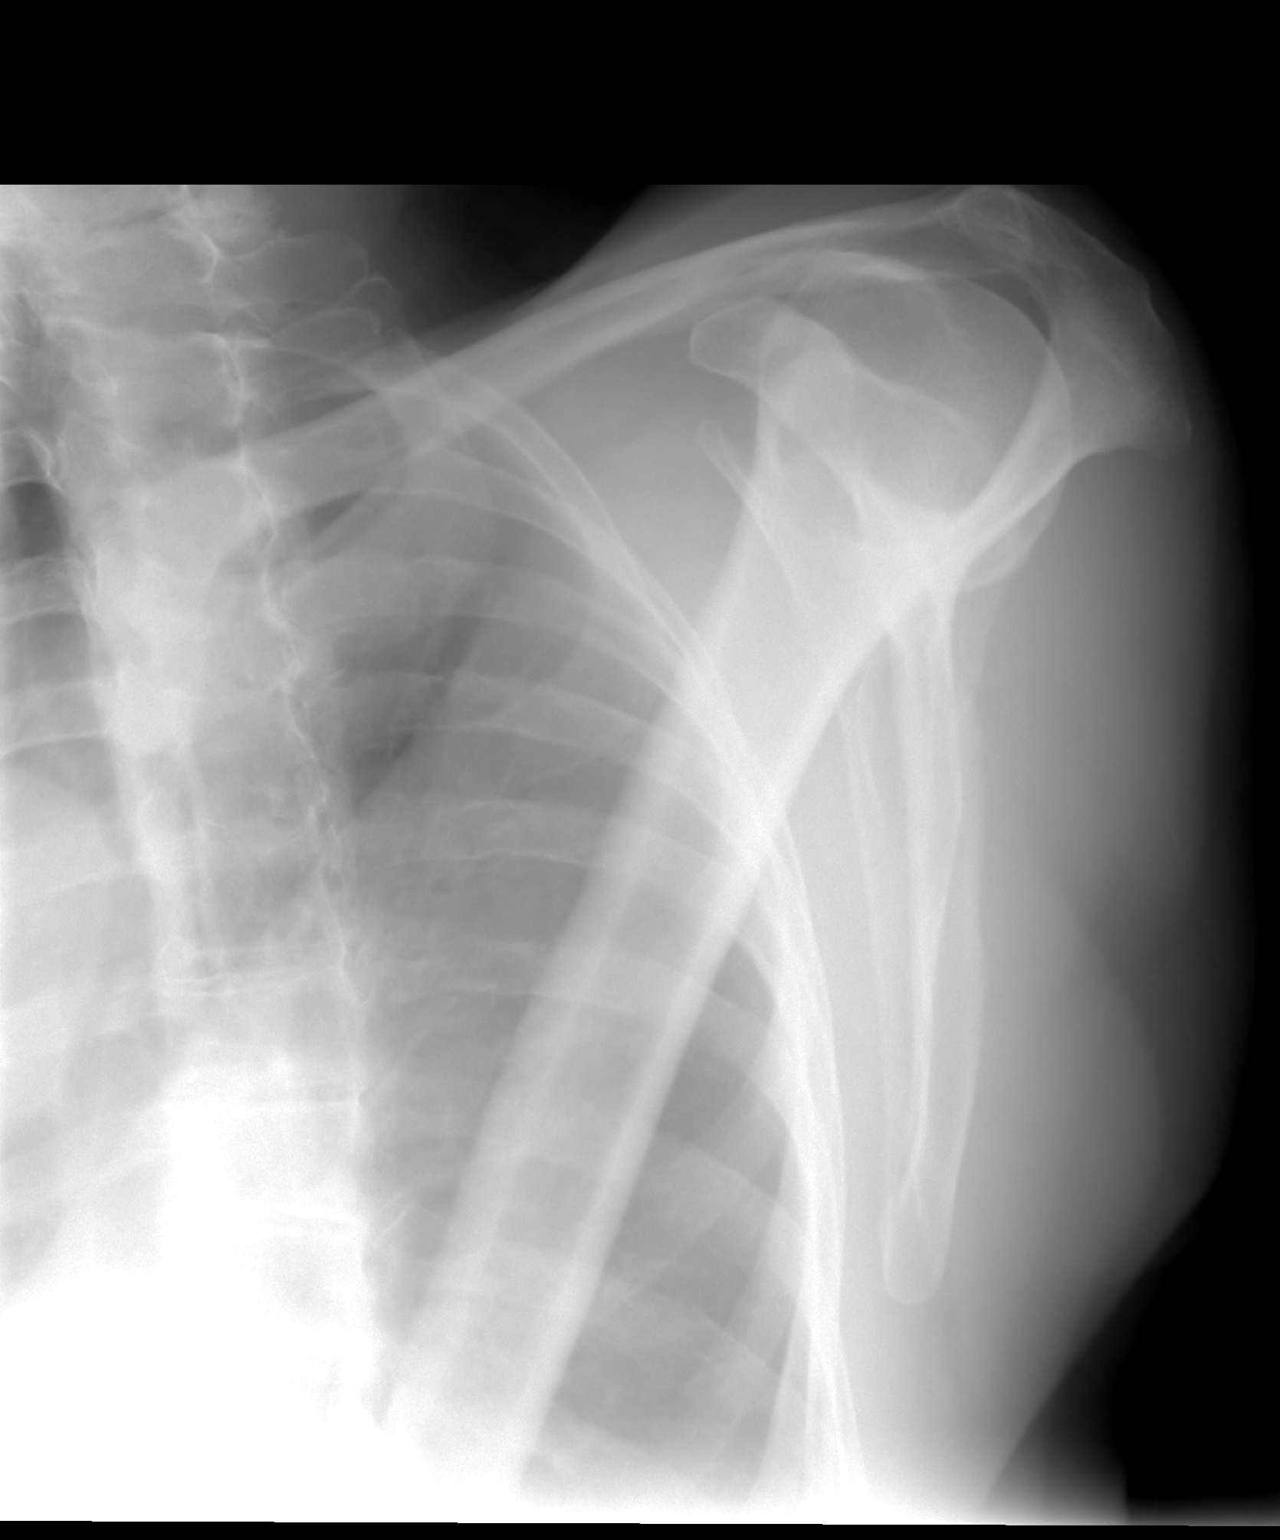

[3 of 3 positions shown; findings below may reference images not displayed]

FINDINGS: There is mild degenerative joint disease involving the
acromioclavicular joint.  The glenohumeral joint appears normal.
No fracture or subluxation identified.  No radiopaque foreign body
or soft tissue calcification.
IMPRESSION: 1.  Mild AC joint osteoarthritis.

## 2014-09-23 ENCOUNTER — Encounter: Payer: Self-pay | Admitting: Family Medicine

## 2014-09-23 ENCOUNTER — Ambulatory Visit (INDEPENDENT_AMBULATORY_CARE_PROVIDER_SITE_OTHER): Payer: Medicare Other | Admitting: Family Medicine

## 2014-09-23 ENCOUNTER — Telehealth: Payer: Self-pay | Admitting: Family Medicine

## 2014-09-23 VITALS — BP 146/72 | HR 59 | Temp 97.6°F | Wt 148.5 lb

## 2014-09-23 DIAGNOSIS — E039 Hypothyroidism, unspecified: Secondary | ICD-10-CM | POA: Diagnosis not present

## 2014-09-23 DIAGNOSIS — R55 Syncope and collapse: Secondary | ICD-10-CM | POA: Diagnosis not present

## 2014-09-23 LAB — CBC WITH DIFFERENTIAL/PLATELET
BASOS ABS: 0 10*3/uL (ref 0.0–0.1)
Basophils Relative: 0.5 % (ref 0.0–3.0)
Eosinophils Absolute: 0.1 10*3/uL (ref 0.0–0.7)
Eosinophils Relative: 1.2 % (ref 0.0–5.0)
HCT: 46.5 % (ref 39.0–52.0)
HEMOGLOBIN: 15.8 g/dL (ref 13.0–17.0)
LYMPHS PCT: 23.7 % (ref 12.0–46.0)
Lymphs Abs: 1.2 10*3/uL (ref 0.7–4.0)
MCHC: 33.9 g/dL (ref 30.0–36.0)
MCV: 95.7 fl (ref 78.0–100.0)
Monocytes Absolute: 0.3 10*3/uL (ref 0.1–1.0)
Monocytes Relative: 5.7 % (ref 3.0–12.0)
NEUTROS ABS: 3.4 10*3/uL (ref 1.4–7.7)
Neutrophils Relative %: 68.9 % (ref 43.0–77.0)
PLATELETS: 127 10*3/uL — AB (ref 150.0–400.0)
RBC: 4.86 Mil/uL (ref 4.22–5.81)
RDW: 14.8 % (ref 11.5–15.5)
WBC: 4.9 10*3/uL (ref 4.0–10.5)

## 2014-09-23 LAB — COMPREHENSIVE METABOLIC PANEL
ALBUMIN: 4 g/dL (ref 3.5–5.2)
ALT: 18 U/L (ref 0–53)
AST: 17 U/L (ref 0–37)
Alkaline Phosphatase: 49 U/L (ref 39–117)
BUN: 16 mg/dL (ref 6–23)
CALCIUM: 9.3 mg/dL (ref 8.4–10.5)
CHLORIDE: 103 meq/L (ref 96–112)
CO2: 30 meq/L (ref 19–32)
CREATININE: 0.81 mg/dL (ref 0.40–1.50)
GFR: 96.71 mL/min (ref >60.00)
GLUCOSE: 76 mg/dL (ref 70–99)
Potassium: 4.2 mEq/L (ref 3.5–5.1)
Sodium: 138 mEq/L (ref 135–145)
Total Bilirubin: 0.5 mg/dL (ref 0.2–1.2)
Total Protein: 6.9 g/dL (ref 6.0–8.3)

## 2014-09-23 LAB — TSH: TSH: 2.78 u[IU]/mL (ref 0.35–4.50)

## 2014-09-23 NOTE — Telephone Encounter (Signed)
Pt has appt with Dr Damita Dunnings today 09/23/14 at 10:30 AM.

## 2014-09-23 NOTE — Telephone Encounter (Signed)
Noted  

## 2014-09-23 NOTE — Progress Notes (Signed)
Pre visit review using our clinic review tool, if applicable. No additional management support is needed unless otherwise documented below in the visit note.  He was seen in late 05/2014 for a likely allergic reaction.  Took benadryl at that point, dialed 911.  Didn't have to use epipen.  No sx in the meantime.  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  D/w pt.   This past weekend he was at [facility], was loading the trunk.  He was leaning over and felt like he was going to pass out.  He didn't pass out.  No room spinning.  No CP, no SOB.  The episode resolved after about 30 sec and then he felt well enough to go home.  No focal neuro changes.  He thought he was better this AM but felt weak, "like it was coming back."  No BLE edema.  He used xanax this AM and that helped a little.  "I guess it was nerves."  He feels well now.  He was able to build a building this summer, putting up OSB and climbing ladders.  Not on any BP meds.  BP has been fine on home checks.    He does have a h/o vertigo, but those episodes and sx are clearly separate and different.    Meds, vitals, and allergies reviewed.   ROS: See HPI.  Otherwise, noncontributory.  GEN: nad, alert and oriented HEENT: mucous membranes moist NECK: supple w/o LA, no bruit CV: rrr PULM: ctab, no inc wob ABD: soft, +bs EXT: no edema DHP neg on exam table.   CN 2-12 wnl B, S/S/DTR wnl x4

## 2014-09-23 NOTE — Telephone Encounter (Signed)
Patient Name: KILLIAN SCHWER  DOB: Jul 07, 1929    Initial Comment Caller states her husband is having dizzy spells, not feeling right.    Nurse Assessment  Nurse: Mallie Mussel, RN, Alveta Heimlich Date/Time Eilene Ghazi Time): 09/23/2014 8:17:25 AM  Confirm and document reason for call. If symptomatic, describe symptoms. ---Caller states that her husband has had dizzy spells beginning Saturday. Denies weakness and numbness. Denies headache. Denies room spinning sensation. He is A&O.  Has the patient traveled out of the country within the last 30 days? ---No  Does the patient require triage? ---Yes  Related visit to physician within the last 2 weeks? ---No  Does the PT have any chronic conditions? (i.e. diabetes, asthma, etc.) ---Yes  List chronic conditions. ---Hypercholesterolemia, Alpha-Gal allergy, Hypothyroidism,     Guidelines    Guideline Title Affirmed Question Affirmed Notes  Dizziness - Lightheadedness [1] MODERATE dizziness (e.g., interferes with normal activities) AND [2] has NOT been evaluated by physician for this (Exception: dizziness caused by heat exposure, sudden standing, or poor fluid intake)    Final Disposition User   See Physician within [place], RN, Alveta Heimlich    Comments  Appointment scheduled with Dr. Elsie Stain for 10:30am today.

## 2014-09-23 NOTE — Patient Instructions (Signed)
Your EKG looks fine (other than your heart rate being a little slow and that isn't atypical for you).  Make sure to drink plenty of water in the meantime. We'll contact you with your lab report. Rosaria Ferries will call about your referral. Take care.  Glad to see you.

## 2014-09-24 ENCOUNTER — Encounter: Payer: Self-pay | Admitting: *Deleted

## 2014-09-24 ENCOUNTER — Telehealth: Payer: Self-pay | Admitting: Family Medicine

## 2014-09-24 DIAGNOSIS — R55 Syncope and collapse: Secondary | ICD-10-CM | POA: Insufficient documentation

## 2014-09-24 NOTE — Telephone Encounter (Signed)
Spoke to pt who states that he has been using his electronic BP cuff at home. He believes that he may have been using it incorrectly and is the reason that he has had "spikes in his BP." He is questioning if he is still needing cardiology referral

## 2014-09-24 NOTE — Telephone Encounter (Signed)
See lab report.  I would still get the cards referral because of the sx he had- ie the episode in [place] parking lot, even if his BP has been fine at home.  Thanks.

## 2014-09-24 NOTE — Assessment & Plan Note (Signed)
Labs unremarkable, EKG wnl except for bradycardia.  Unclear if he has rate related sx, ie episodic bradycardia that is much more profound.  Not on BP meds.  Normal neuro exam.  Still okay for outpatient f/u.  Advised him to stay well hydrated and we'll ask for cards input.  He agrees. >25 minutes spent in face to face time with patient, >50% spent in counselling or coordination of care.

## 2014-09-24 NOTE — Telephone Encounter (Signed)
Pt is requesting callback regarding blood pressure. Please call home number listed, thanks

## 2014-09-24 NOTE — Telephone Encounter (Signed)
Patient notified as instructed by telephone and verbalized understanding. 

## 2014-09-25 ENCOUNTER — Telehealth: Payer: Self-pay | Admitting: *Deleted

## 2014-09-25 NOTE — Telephone Encounter (Signed)
I contacted patient per Dr Kennon Holter recommendation and offered him an appt before August.  Patient is scheduled for 7/21 with Dr Gwenlyn Found.

## 2014-09-30 ENCOUNTER — Ambulatory Visit (INDEPENDENT_AMBULATORY_CARE_PROVIDER_SITE_OTHER): Payer: Medicare Other | Admitting: Family Medicine

## 2014-09-30 ENCOUNTER — Encounter: Payer: Self-pay | Admitting: Family Medicine

## 2014-09-30 VITALS — BP 126/70 | HR 73 | Temp 98.3°F | Wt 149.0 lb

## 2014-09-30 DIAGNOSIS — R55 Syncope and collapse: Secondary | ICD-10-CM | POA: Diagnosis not present

## 2014-09-30 MED ORDER — ALPRAZOLAM 0.25 MG PO TABS: 0.1250 mg | ORAL_TABLET | Freq: Three times a day (TID) | ORAL | Status: DC | PRN

## 2014-09-30 NOTE — Patient Instructions (Signed)
Use the least amount of xanax possible.  I'll await the recommendations from Dr. Gwenlyn Found.  Take care.  Glad to see you.

## 2014-09-30 NOTE — Progress Notes (Signed)
Pre visit review using our clinic review tool, if applicable. No additional management support is needed unless otherwise documented below in the visit note.  No more episodes like the presyncope in [place] parking lot.  We clarified this today- he was presyncopal at the time w/o room spinning and he had sx while leaning over, not with standing back up.  He had no LOC.  No similar episodes in the meantime.    He has been anxious about the whole affair.  His "nerves are better" and he felt better taking prn xanax.  Taking 0.25mg  qid prn.  He had an old rx for prn use.  Worried about the prev events and worried about needing cards eval.  No other events like the prev documented presyncopal episode.   Meds, vitals, and allergies reviewed.   ROS: See HPI.  Otherwise, noncontributory.  GEN: nad, alert and oriented HEENT: mucous membranes moist NECK: supple w/o LA CV: rrr. PULM: ctab, no inc wob ABD: soft, +bs EXT: no edema CN 2-12 wnl B, S/S/DTR wnl x4, cerebellar testing wnl

## 2014-10-01 NOTE — Assessment & Plan Note (Addendum)
No more episodes, but I still want him to see cards.  D/w pt about recent labs, all wnl.  He has been anxious, d/w pt about using the smallest dose possible of xanax.  rx called in, see EMR med list.  I still don't know if he had an episode of bradycardia causing his sx and I would like cards input on the utility of ambulatory monitoring.  D/w pt.  He agrees.  >25 minutes spent in face to face time with patient, >50% spent in counselling or coordination of care.  I think the event and the w/u has caused some anxiety, but I don't think that anxiety caused the initial event.  He agrees.

## 2014-10-07 ENCOUNTER — Telehealth: Payer: Self-pay

## 2014-10-07 DIAGNOSIS — L570 Actinic keratosis: Secondary | ICD-10-CM | POA: Diagnosis not present

## 2014-10-07 DIAGNOSIS — L821 Other seborrheic keratosis: Secondary | ICD-10-CM | POA: Diagnosis not present

## 2014-10-07 DIAGNOSIS — Z79899 Other long term (current) drug therapy: Secondary | ICD-10-CM | POA: Diagnosis not present

## 2014-10-07 DIAGNOSIS — Z85828 Personal history of other malignant neoplasm of skin: Secondary | ICD-10-CM | POA: Diagnosis not present

## 2014-10-07 DIAGNOSIS — L309 Dermatitis, unspecified: Secondary | ICD-10-CM | POA: Diagnosis not present

## 2014-10-07 NOTE — Telephone Encounter (Signed)
0.25mg  per tab.  We lowered his dose at the last OV.  Thanks.

## 2014-10-07 NOTE — Telephone Encounter (Signed)
Pharmacist at [facility] advised.

## 2014-10-07 NOTE — Telephone Encounter (Signed)
[facility] left v/m requesting cb with clarification of what strength alprazolam pt is supposed to be taking. pts historical med list has alprazolam 0.5 mg on 09/30/14 and current med list has alprazolam 0.25 mg on 09/30/14.Please advise.

## 2014-10-08 ENCOUNTER — Encounter (HOSPITAL_COMMUNITY): Payer: Self-pay | Admitting: General Practice

## 2014-10-08 ENCOUNTER — Emergency Department (HOSPITAL_COMMUNITY)
Admission: EM | Admit: 2014-10-08 | Discharge: 2014-10-08 | Disposition: A | Discharge status: Home / Self Care | Payer: Medicare Other | Attending: Emergency Medicine | Admitting: Emergency Medicine

## 2014-10-08 ENCOUNTER — Telehealth: Payer: Self-pay | Admitting: Family Medicine

## 2014-10-08 ENCOUNTER — Emergency Department (HOSPITAL_COMMUNITY): Payer: Medicare Other

## 2014-10-08 DIAGNOSIS — Z7982 Long term (current) use of aspirin: Secondary | ICD-10-CM | POA: Insufficient documentation

## 2014-10-08 DIAGNOSIS — Z862 Personal history of diseases of the blood and blood-forming organs and certain disorders involving the immune mechanism: Secondary | ICD-10-CM | POA: Diagnosis not present

## 2014-10-08 DIAGNOSIS — Z8659 Personal history of other mental and behavioral disorders: Secondary | ICD-10-CM | POA: Diagnosis not present

## 2014-10-08 DIAGNOSIS — Z8719 Personal history of other diseases of the digestive system: Secondary | ICD-10-CM | POA: Insufficient documentation

## 2014-10-08 DIAGNOSIS — Z8619 Personal history of other infectious and parasitic diseases: Secondary | ICD-10-CM | POA: Insufficient documentation

## 2014-10-08 DIAGNOSIS — E039 Hypothyroidism, unspecified: Secondary | ICD-10-CM | POA: Diagnosis not present

## 2014-10-08 DIAGNOSIS — Z79899 Other long term (current) drug therapy: Secondary | ICD-10-CM | POA: Diagnosis not present

## 2014-10-08 DIAGNOSIS — R51 Headache: Secondary | ICD-10-CM | POA: Diagnosis present

## 2014-10-08 DIAGNOSIS — R55 Syncope and collapse: Secondary | ICD-10-CM | POA: Insufficient documentation

## 2014-10-08 DIAGNOSIS — R519 Headache, unspecified: Secondary | ICD-10-CM

## 2014-10-08 NOTE — ED Notes (Signed)
Patient transported to CT 

## 2014-10-08 NOTE — ED Provider Notes (Signed)
CSN: [id number]     Arrival date & time 10/08/14  0932 History   First MD Initiated Contact with Patient 10/08/14 [phone]     Chief Complaint  Patient presents with  . Headache  . Near Syncope     (Consider location/radiation/quality/duration/timing/severity/associated sxs/prior Treatment) Patient is a 81 y.o. male presenting with headaches. The history is provided by the patient. No language interpreter was used.  Headache Pain location:  R temporal Quality: pulsating. Radiates to:  Does not radiate Severity currently:  0/10 Severity at highest:  7/10 Onset quality:  Sudden Duration:  1 day (occurred yesterday) Timing:  Intermittent Progression since onset: resolved, now only 'tender' over R occipital region. Chronicity:  New Similar to prior headaches: no   Relieved by: spontaneously. Tried xanax and tylenol but didn't resolve  Associated symptoms: no abdominal pain, no back pain, no blurred vision, no cough, no dizziness, no ear pain, no eye pain, no facial pain, no fever, no hearing loss, no loss of balance, no myalgias, no nausea, no near-syncope, no neck pain, no neck stiffness, no photophobia, no sinus pressure, no sore throat, no syncope, no URI, no visual change, no vomiting and no weakness     Past Medical History  Diagnosis Date  . Thrombocytopenia, unspecified   . Unspecified hypothyroidism   . Family history of diabetes mellitus   . History of chickenpox   . Other and unspecified hyperlipidemia   . Esophageal reflux    Past Surgical History  Procedure Laterality Date  . Appendectomy  1949   Family History  Problem Relation Age of Onset  . Stroke Mother   . Diabetes Mother     borderline  . Pneumonia Father   . Hypertension Sister   . Hyperlipidemia Sister   . Alzheimer's disease Brother   . Dementia Brother   . Arthritis Other   . Diabetes Other     1st degree relative  . Hyperlipidemia Other   . Hypertension Other   . Cancer Sister 36    colon  .  Colon cancer Sister   . Prostate cancer Neg Hx    History  Substance Use Topics  . Smoking status: Never Smoker   . Smokeless tobacco: Never Used     Comment: smoked only as a teenage small amount  . Alcohol Use: No     Comment: very rare    Review of Systems  Constitutional: Negative for fever and chills.  HENT: Negative for ear pain, hearing loss, mouth sores, sinus pressure and sore throat.   Eyes: Negative for blurred vision, photophobia, pain and visual disturbance.  Respiratory: Negative for cough and shortness of breath.   Cardiovascular: Negative for chest pain, syncope and near-syncope.  Gastrointestinal: Negative for nausea, vomiting and abdominal pain.  Endocrine: Negative for polyuria.  Genitourinary: Negative for dysuria and flank pain.  Musculoskeletal: Negative for myalgias, back pain, neck pain and neck stiffness.  Skin: Negative for rash.  Neurological: Positive for headaches. Negative for dizziness, weakness and loss of balance.  Hematological: Does not bruise/bleed easily.  Psychiatric/Behavioral: Negative for behavioral problems and agitation.  All other systems reviewed and are negative.     Allergies  Antihistamines, diphenhydramine-type; Atorvastatin; Beef-derived products; Ezetimibe; Other; Simvastatin; and Sulfonamide derivatives  Home Medications   Prior to Admission medications   Medication Sig Start Date End Date Taking? Authorizing Provider  ALPRAZolam (XANAX) 0.25 MG tablet Take 0.5-1 tablets (0.125-0.25 mg total) by mouth 3 (three) times daily as needed for anxiety. Patient  taking differently: Take 0.25 mg by mouth 3 (three) times daily as needed for anxiety.  09/30/14  Yes Tonia Ghent, MD  aspirin 81 MG tablet Take 81 mg by mouth daily.   Yes Historical Provider, MD  calcium carbonate (TUMS - DOSED IN MG ELEMENTAL CALCIUM) 500 MG chewable tablet Chew 1 tablet by mouth daily as needed for indigestion or heartburn.   Yes Historical Provider,  MD  Coenzyme Q10 (CO Q 10 PO) Take 1 capsule by mouth daily.   Yes Historical Provider, MD  EPINEPHrine 0.3 mg/0.3 mL IJ SOAJ injection Inject 0.3 mLs (0.3 mg total) into the muscle once. 06/18/14  Yes Owens Loffler, MD  folic acid (FOLVITE) 1 MG tablet Take 1 mg by mouth daily.   Yes Historical Provider, MD  KRILL OIL PO Take 1 tablet by mouth daily.   Yes Historical Provider, MD  methotrexate (RHEUMATREX) 2.5 MG tablet Take 2.5 mg by mouth 2 (two) times a week.    Yes Historical Provider, MD  Multiple Vitamins-Minerals (MULTIVITAMIN WITH MINERALS) tablet Take 1 tablet by mouth daily.   Yes Historical Provider, MD  Omega-3 Fatty Acids (FISH OIL PO) Take 1 tablet by mouth daily.   Yes Historical Provider, MD  Red Yeast Rice Extract (RED YEAST RICE PO) Take 1 tablet by mouth daily.   Yes Historical Provider, MD  SYNTHROID 50 MCG tablet TAKE 1 TABLET BY MOUTH DAILY. 11/21/13  Yes Tonia Ghent, MD   BP 132/80 mmHg  Pulse 78  Temp(Src) 98.5 F (36.9 C)  Resp 17  Ht 5\' 4"  (1.626 m)  Wt 148 lb (67.132 kg)  BMI 25.39 kg/m2  SpO2 99% Physical Exam  Constitutional: He is oriented to person, place, and time. He appears well-developed and well-nourished. No distress.  HENT:  Head: Normocephalic and atraumatic.  Right Ear: External ear normal.  Left Ear: External ear normal.  Mouth/Throat: Oropharynx is clear and moist. No oropharyngeal exudate.  Clear TMs bilaterally  Eyes: Conjunctivae are normal. Pupils are equal, round, and reactive to light.  Neck: Normal range of motion. Neck supple.  No carotid bruits auscultated bilaterally  Cardiovascular: Normal rate, regular rhythm, normal heart sounds and intact distal pulses.  Exam reveals no gallop and no friction rub.   No murmur heard. Pulmonary/Chest: Effort normal and breath sounds normal. No respiratory distress. He has no wheezes.  Abdominal: Soft. He exhibits no distension and no mass. There is no tenderness. There is no rebound and no  guarding.  Musculoskeletal: Normal range of motion. He exhibits no edema or tenderness.  Neurological: He is alert and oriented to person, place, and time. He displays normal reflexes. No cranial nerve deficit. He exhibits normal muscle tone. Coordination normal.  The R temporo-occipital area the patient is complaining about has no lesions or no reproducibility with palpation.  Skin: Skin is warm and dry. He is not diaphoretic.  Psychiatric: He has a normal mood and affect. His behavior is normal.  Nursing note and vitals reviewed.   ED Course  Procedures (including critical care time) Labs Review Labs Reviewed - No data to display  Imaging Review No results found.   EKG Interpretation   Date/Time:  Wednesday October 08 2014 10:04:05 EDT Ventricular Rate:  72 PR Interval:  153 QRS Duration: 98 QT Interval:  430 QTC Calculation: 471 R Axis:   22 Text Interpretation:  Sinus rhythm Confirmed by ZAVITZ  MD, JOSHUA ([id number])  on 10/08/2014 10:38:46 AM  MDM   Final diagnoses:  None    Mr. Lepage is an 81yo M with PMH anxiety, hyperlipidemia, and hypothyroidism as well as a presyncopal episode 2 weeks ago that presents with a R sided headache that occurred yesterday over the R temporo/occipital region as well as generalized feeling of 'grogginess' and worrying since the episode 2 weeks ago, not having the same presyncopal symptoms but having a sense that 'it might happen again'. The pulsating headache that shoots down to his R ear is associated with no focal neurologic deficits, vision changes, neck pain or stiffness, lightheadedness/LOC, weakness or dizziness, palpitations, shortness of breath or any other symptoms, and resolved yesterday spontaneously. However, the patient, accompanied by his wife and daughter, says that he still feels a mild tenderness over a small area in the region where the headache originated yesterday. He expresses concern over what's causing him to feel  out-of-sorts over the past couple of weeks and what caused the presyncopal episode 2 weeks ago. CT head non-con is negative for ischemia or hemorrhage. We believe patient is safe for discharge.    Norval Gable, MD 10/08/14 1319  Elnora Morrison, MD 10/11/14 [phone]

## 2014-10-08 NOTE — Telephone Encounter (Signed)
Appointment offered patient refused and states would like to go to [facility]

## 2014-10-08 NOTE — ED Notes (Signed)
Urine and blood specimens are at bedside.  No orders at this time.

## 2014-10-08 NOTE — Telephone Encounter (Signed)
[facility]     Patient Name: Larry [name] - Client    Client Site [place] [name]    Physician Renford Dills     Contact Type Call  Gender: Male Call Type Triage / Clinical  DOB: [birth year]  Relationship To Patient Self  Age: 81 Y 1 M 18 D Return Phone Number [phone] (Primary)  Return Phone Number: [phone] (Primary) Chief Complaint Headache  Address: [place]  Initial Comment caller states he passed out a few times last week - went to a cardiologist - everything was fine. Last night he had sharp pains on the side of his head- states they were shooting pains- does he need to go to the ER for a scan  City/State/Zip: [place] [address] PreDisposition Call Doctor    Nurse Assessment  Nurse: Buck Mam, RN, Trish Date/Time Eilene Ghazi Time): 10/08/2014 8:26:21 AM  Confirm and document reason for call. If symptomatic, describe symptoms. ---Patient is calling for self and caller states he passed out a few times last week - went to PCP and was referred to cardiologist -PCP did blood work and said pulse was low. Last night he had sharp pains on the side of his head- states they were shooting pains- does he need to go to the ER for a scan for H/A. This H/A for over 24 hours (scalp down towards Right ear) tylenol not helping no fever, is on xanax for anxiety  Has the patient traveled out of the country within the last 30 days? ---No  Does the patient require triage? ---Yes  Related visit to physician within the last 2 weeks? ---Yes  Does the PT have any chronic conditions? (i.e. diabetes, asthma, etc.) ---Yes  List chronic conditions. ---HEMORRHOIDS -5 yr Interface, Problem List In Overview Qualifier: Diagnosis of By: Danise Mina MD, Garlon Hatchet Near syncope -7 days Tonia Ghent, MD Respiratory RHINITIS -4 yr Tonia Ghent, MD  Overview Qualifier: Diagnosis of By: Damita Dunnings MD, Phillip Heal Digestive GERD -5 yr Interface, Problem List In Overview Qualifier: Diagnosis of By: Fuller Plan CMA (AAMA), [place] Lower GI bleed -2 yr Rise Patience, MD Rectal bleeding -1 yr Tonia Ghent, MD Ischemic colitis -2 yr Tat, Shanon Brow, MD Endocrine HYPOTHYROIDISM -1 yr Tonia Ghent, MD Overview Qualifier: Diagnosis of By: Fuller Plan CMA Deborra Medina), [place] Musculoskeletal and Integument Chronic dermatitis -1 yr Tonia Ghent, MD Overview Per Phoebe Perch- MTX tx as of 2015 Genitourinary BPH (benign prostatic hyperplasia) -3 yr Tonia Ghent, MD Other HYPERLIPIDEMIA -1 yr Tonia Ghent, MD Overview Qualifier: Diagnosis of By: Fuller Plan CMA (AAMA), [place] UNSPECIFIED THROMBOCYTOPENIA -1 yr Tonia Ghent, MD Overview Qualifier: Diagnosis of By: Fuller Plan CMA (AAMA), [place] LOW BACK PAIN -5 yr Interface, Problem List In Overview Qualifier: Diagnosis of By: Fuller Plan CMA (AAMA), [place] HEADACHE -5 yr Interface, Problem List In Overview Qualifier: Diagnosis of By: Fuller Plan CMA Deborra Medina), [place] Medicare annual wellness visit, subsequent -1 yr Tonia Ghent, MD Vertigo -3 yr Tonia Ghent, MD Shoulder pain -3 yr Tonia Ghent, MD Situational anxiety -3 yr Tonia Ghent, MD Leg swelling -1 yr Tonia Ghent, MD Advance care planning -1 yr Tonia Ghent, MD Overview Advance directive- wife designated if patient were incapacitated.    Guidelines      Guideline Title Affirmed Question Affirmed Notes Nurse Date/Time Eilene Ghazi Time)  Headache [1] MODERATE headache (e.g., interferes with normal activities) AND [2] present > 24 hours AND [3] unexplained (Exceptions: analgesics not tried, typical migraine, or headache part of viral illness)  Buck Mam, RN, Trish 10/08/2014 8:32:10 AM  Disp. Time Eilene Ghazi Time) Disposition Final User         10/08/2014 8:41:47 AM See Physician within 24 Hours Yes Buck Mam, RN, Industrial/product designer Understands: Yes   Disagree/Comply: Comply       Care Advice Given Per Guideline         SEE PHYSICIAN WITHIN 24 HOURS: LOCAL COLD: Apply a cold wet washcloth or cold pack to the forehead for 20 minutes STRETCHING: Stretch and massage any tight neck muscles. CALL BACK IF: * You become worse. CARE ADVICE given per Headache (Adult) guideline. ACETAMINOPHEN (E.G., TYLENOL): * Take 650 mg (two 325 mg pills) by mouth every 4-6 hours as needed. Each Regular Strength Tylenol pill has 325 mg of acetaminophen. The most you should take each [name] is 3,250 mg (10 Regular Strength pills a [name]).   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  Referrals   REFERRED TO PCP OFFICE

## 2014-10-08 NOTE — Telephone Encounter (Signed)
Will await ER notes.  Thanks.

## 2014-10-08 NOTE — ED Notes (Signed)
Pt presents to the ED with complaints of almost passing out a week and a half ago. Pt went ot primary care provider and finding showed pt had a low heart rate. Pt reports feeling light headed since near syncopal episode. Pt also reporting head pressure and sharp pains on the right lateral side of head since yesterday. .Pt woke up this morning at 0400 nervous, pt took 2 Xanax tablet equally 5 mg. Pt believes symptoms are associated with Xanax usage. Pt is A/O.

## 2014-10-09 ENCOUNTER — Ambulatory Visit: Payer: Medicare Other | Admitting: Cardiovascular Disease

## 2014-10-09 ENCOUNTER — Ambulatory Visit (INDEPENDENT_AMBULATORY_CARE_PROVIDER_SITE_OTHER): Payer: Medicare Other | Admitting: Cardiovascular Disease

## 2014-10-09 ENCOUNTER — Encounter: Payer: Self-pay | Admitting: Cardiovascular Disease

## 2014-10-09 VITALS — BP 150/82 | HR 88 | Ht 64.0 in | Wt 149.0 lb

## 2014-10-09 DIAGNOSIS — R55 Syncope and collapse: Secondary | ICD-10-CM

## 2014-10-09 NOTE — Patient Instructions (Signed)
Follow up as needed

## 2014-10-09 NOTE — Assessment & Plan Note (Signed)
History of hypertriglyceridemia on oil and omega-3 fish oil as well as well as red yeast rice followed by his PCP.

## 2014-10-09 NOTE — Progress Notes (Signed)
10/09/2014 Larry [name]   11-06-1929  [id number]  Primary Physician Elsie Stain, MD Primary Cardiologist: Lorretta Harp MD Renae Gloss   HPI:  [name] is an 81 year old mildly overweight married Caucasian male father of 4 children (Larry [name] is one of his daughters), grandfather to 84 grandchildren who is accompanied by [name] who is also a patient of mine. He was referred by Dr. Damita Dunnings for cardiovascular evaluation because of presyncope. He is retired from working at [employer]. His only cardiac risk factor is hypertriglyceridemia. He is statin intolerant. There is no family history of heart disease. He's never had a heart attack stroke and denies chest pain or shortness of breath. He had had an episode of presyncope several months ago while at low-dose. By his description it sounded like he was dehydrated. He's had no recurrent episodes. A recent 12-lead EKG was normal.   Current Outpatient Prescriptions  Medication Sig Dispense Refill  . ALPRAZolam (XANAX) 0.25 MG tablet Take 0.5-1 tablets (0.125-0.25 mg total) by mouth 3 (three) times daily as needed for anxiety. (Patient taking differently: Take 0.25 mg by mouth 3 (three) times daily as needed for anxiety. ) 30 tablet 1  . aspirin 81 MG tablet Take 81 mg by mouth daily.    . calcium carbonate (TUMS - DOSED IN MG ELEMENTAL CALCIUM) 500 MG chewable tablet Chew 1 tablet by mouth daily as needed for indigestion or heartburn.    . Coenzyme Q10 (CO Q 10 PO) Take 1 capsule by mouth daily.    Marland Kitchen EPINEPHrine 0.3 mg/0.3 mL IJ SOAJ injection Inject 0.3 mLs (0.3 mg total) into the muscle once. 1 Device 2  . folic acid (FOLVITE) 1 MG tablet Take 1 mg by mouth daily.    Marland Kitchen KRILL OIL PO Take 1 tablet by mouth daily.    . methotrexate (RHEUMATREX) 2.5 MG tablet Take 2.5 mg by mouth 2 (two) times a week.     . Multiple Vitamins-Minerals (MULTIVITAMIN WITH MINERALS) tablet Take 1 tablet by mouth daily.    .  Omega-3 Fatty Acids (FISH OIL PO) Take 1 tablet by mouth daily.    . Red Yeast Rice Extract (RED YEAST RICE PO) Take 1 tablet by mouth daily.    Marland Kitchen SYNTHROID 50 MCG tablet TAKE 1 TABLET BY MOUTH DAILY. 90 tablet 2   No current facility-administered medications for this visit.    Allergies  Allergen Reactions  . Antihistamines, Diphenhydramine-Type     Urinary retention  . Atorvastatin     REACTION: Intolerance  . Beef-Derived Products     Tolerates chicken/fish/turkey  . Ezetimibe     REACTION: Intolerance  . Other     ALPHAGAL  . Simvastatin     REACTION: Intolerance  . Sulfonamide Derivatives     REACTION: hives, itching    History   Social History  . Marital Status: Single    Spouse Name: N/A  . Number of Children: 4  . Years of Education: N/A   Occupational History  . Retired from [employer] History Main Topics  . Smoking status: Never Smoker   . Smokeless tobacco: Never Used     Comment: smoked only as a teenage small amount  . Alcohol Use: No     Comment: very rare  . Drug Use: No  . Sexual Activity: Not on file   Other Topics Concern  . Not on file   Social History Narrative  Caffeine:  Coffee, tea, 2+ daily.   Diet:  Fish, chicken, vegetables and a little red meat   Regular exercise:  Yes, yard work, gardening, cutting wood.   Married 1953   4 kids     Review of Systems: General: negative for chills, fever, night sweats or weight changes.  Cardiovascular: negative for chest pain, dyspnea on exertion, edema, orthopnea, palpitations, paroxysmal nocturnal dyspnea or shortness of breath Dermatological: negative for rash Respiratory: negative for cough or wheezing Urologic: negative for hematuria Abdominal: negative for nausea, vomiting, diarrhea, bright red blood per rectum, melena, or hematemesis Neurologic: negative for visual changes, syncope, or dizziness All other systems reviewed and are otherwise negative except as noted  above.    Blood pressure 150/82, pulse 88, height 5\' 4"  (1.626 m), weight 149 lb (67.586 kg).  General appearance: alert and no distress Neck: no adenopathy, no carotid bruit, no JVD, supple, symmetrical, trachea midline and thyroid not enlarged, symmetric, no tenderness/mass/nodules Lungs: clear to auscultation bilaterally Heart: regular rate and rhythm, S1, S2 normal, no murmur, click, rub or gallop Extremities: extremities normal, atraumatic, no cyanosis or edema  EKG not performed today  ASSESSMENT AND PLAN:   HYPERLIPIDEMIA History of hypertriglyceridemia on oil and omega-3 fish oil as well as well as red yeast rice followed by his PCP.  Near syncope Mr. Krolikowski had an episode of presyncope several weeks ago lasting approximately 15 seconds. He had a episode 2 years ago while mowing his lawn. He recently had a head CT because of scalp hyperesthesia which was negative. He denies chest pain or shortness of breath. He really has no significant cardiac risk factors. His exam is benign. His EKGs have reviewed are unremarkable. I suspect his presyncope was related to dehydration. No further workup is required at this time.      Lorretta Harp MD FACP,FACC,FAHA, [facility] 10/09/2014 10:45 AM

## 2014-10-09 NOTE — Assessment & Plan Note (Signed)
Mr. Windt had an episode of presyncope several weeks ago lasting approximately 15 seconds. He had a episode 2 years ago while mowing his lawn. He recently had a head CT because of scalp hyperesthesia which was negative. He denies chest pain or shortness of breath. He really has no significant cardiac risk factors. His exam is benign. His EKGs have reviewed are unremarkable. I suspect his presyncope was related to dehydration. No further workup is required at this time.

## 2014-10-16 ENCOUNTER — Telehealth: Payer: Self-pay

## 2014-10-16 DIAGNOSIS — E785 Hyperlipidemia, unspecified: Secondary | ICD-10-CM

## 2014-10-16 MED ORDER — CLONAZEPAM 0.5 MG PO TABS: 0.2500 mg | ORAL_TABLET | Freq: Every day | ORAL | Status: DC | PRN

## 2014-10-16 NOTE — Telephone Encounter (Signed)
Patient advised.  Patient states he would like to make the switch.  Medication phoned to pharmacy.   Patient also states that he would like to cancel appt 10/17/14 and come at a later time after lab work.  He says he still has a bit of his wellness exam to finish and he would like to get a lipid profile and then come in for an OV.  Please advise.

## 2014-10-16 NOTE — Telephone Encounter (Signed)
Pt request to switch from xanax to klonopin; pt thinks xanax is causing dizziness. Pt said when takes xanax 0.25 mg taking 1/2 tab pt gets dizzy. Pt has already scheduled appt on 10/17/14 at 2 pm with Dr Damita Dunnings. Pt wants to know if needs to keep appt or can pt just stop the xanax and start klonopin. [facility]. Pt request cb.

## 2014-10-16 NOTE — Telephone Encounter (Signed)
We can try the change, but the klonopin can potentially cause same troubles.  If he wants to make the switch, then I'm okay with that.   Keep the OV if desired.  Stop xanax.  Please call in klonopin.  Don't take klonopin within 12 hours of the last dose of xanax.

## 2014-10-17 ENCOUNTER — Ambulatory Visit: Payer: Medicare Other | Admitting: Family Medicine

## 2014-10-17 NOTE — Telephone Encounter (Signed)
Lipid panel ordered. Thanks.

## 2014-10-17 NOTE — Telephone Encounter (Signed)
Lab appt and OV scheduled.

## 2014-10-20 ENCOUNTER — Other Ambulatory Visit (INDEPENDENT_AMBULATORY_CARE_PROVIDER_SITE_OTHER): Payer: Medicare Other

## 2014-10-20 DIAGNOSIS — E785 Hyperlipidemia, unspecified: Secondary | ICD-10-CM | POA: Diagnosis not present

## 2014-10-20 LAB — LIPID PANEL
CHOL/HDL RATIO: 5
Cholesterol: 196 mg/dL (ref 0–200)
HDL: 41.8 mg/dL (ref >39.00)
NONHDL: 153.71
Triglycerides: 352 mg/dL — ABNORMAL HIGH (ref 0.0–149.0)
VLDL: 70.4 mg/dL — ABNORMAL HIGH (ref 0.0–40.0)

## 2014-10-20 LAB — LDL CHOLESTEROL, DIRECT: LDL DIRECT: 79 mg/dL

## 2014-10-23 ENCOUNTER — Ambulatory Visit (INDEPENDENT_AMBULATORY_CARE_PROVIDER_SITE_OTHER): Payer: Medicare Other | Admitting: Family Medicine

## 2014-10-23 ENCOUNTER — Encounter: Payer: Self-pay | Admitting: Family Medicine

## 2014-10-23 VITALS — BP 132/72 | HR 64 | Temp 98.4°F | Ht 64.0 in | Wt 148.5 lb

## 2014-10-23 DIAGNOSIS — Z0000 Encounter for general adult medical examination without abnormal findings: Secondary | ICD-10-CM | POA: Diagnosis not present

## 2014-10-23 DIAGNOSIS — Z23 Encounter for immunization: Secondary | ICD-10-CM | POA: Diagnosis not present

## 2014-10-23 MED ORDER — ALPRAZOLAM 0.25 MG PO TABS: 0.1250 mg | ORAL_TABLET | Freq: Two times a day (BID) | ORAL | Status: DC | PRN

## 2014-10-23 NOTE — Progress Notes (Signed)
Pre visit review using our clinic review tool, if applicable. No additional management support is needed unless otherwise documented below in the visit note.  I have personally reviewed the patient's hx and have noted 1. The patient's medical and social history 2. Their use of alcohol, tobacco or illicit drugs 3. Their current medications and supplements 4. The patient's functional ability including ADL's, fall risks, home safety risks and hearing or visual             impairment. 5. Diet and physical activities 6. Evidence for depression or mood disorders  The patients weight, height, BMI have been recorded in the chart and visual acuity is per eye clinic.  I have made referrals, counseling and provided education to the patient based review of the above and I have provided the pt with a written personalized care plan for preventive services.  Provider list updated.  Routine anticipatory guidance given to patient.  See health maintenance.  Flu encouraged Shingles 2009 PNA updated 2016 Tetanus 2015 Colon cancer screening not due given his age.  He agrees.  Prostate cancer screening and PSA options (with potential risks and benefits of testing vs not testing) were discussed along with recent recs/guidelines.  He declined testing PSA at this point. Advance directive d/w pt.  Wife designated if patient were incapacitated.  Cognitive function addressed- see scanned forms- and if abnormal then additional documentation follows.  Anxiety is improved.  He tolerated xanax better than klonopin.  Using rarely now.   Lightheaded sx improved, no syncope.  No more near syncope.  Had f/u with cards- no cardiac source seen.   Inc in TG noted.  Statin intolerant.   Tolerating current meds.  Labs d/w pt.   PMH and SH reviewed  Meds, vitals, and allergies reviewed.   ROS: See HPI.  Otherwise negative.    GEN: nad, alert and oriented HEENT: mucous membranes moist NECK: supple w/o LA CV: rrr. PULM:  ctab, no inc wob ABD: soft, +bs EXT: no edema SKIN: no acute rash

## 2014-10-23 NOTE — Patient Instructions (Signed)
Take care.  Glad to see you.  Take the least amount of xanax possible for anxiety.  Make sure to stay well hydrated.  I would get a flu shot each fall.

## 2014-10-24 ENCOUNTER — Ambulatory Visit: Payer: Medicare Other | Admitting: Cardiovascular Disease

## 2014-10-24 NOTE — Assessment & Plan Note (Signed)
Flu encouraged Shingles 2009 PNA updated 2016 Tetanus 2015 Colon cancer screening not due given his age.  He agrees.  Prostate cancer screening and PSA options (with potential risks and benefits of testing vs not testing) were discussed along with recent recs/guidelines.  He declined testing PSA at this point. Advance directive d/w pt.  Wife designated if patient were incapacitated.  Cognitive function addressed- see scanned forms- and if abnormal then additional documentation follows.  Anxiety is improved.  He tolerated xanax better than klonopin.  Using rarely now.   Lightheaded sx improved, no syncope.  No more near syncope.  Had f/u with cards- no cardiac source seen.   Inc in TG noted.  Statin intolerant.   Tolerating current meds.  Labs d/w pt.  No falls.  Has hearing aids for prn use Normal memory testing- A&Ox3, normal math, watch reading, 3/3 recall.

## 2014-11-08 ENCOUNTER — Other Ambulatory Visit: Payer: Self-pay | Admitting: Family Medicine

## 2014-11-11 DIAGNOSIS — H2513 Age-related nuclear cataract, bilateral: Secondary | ICD-10-CM | POA: Diagnosis not present

## 2014-12-08 ENCOUNTER — Other Ambulatory Visit: Payer: Self-pay | Admitting: Family Medicine

## 2014-12-08 NOTE — Telephone Encounter (Signed)
Please call in.  Thanks.   

## 2014-12-08 NOTE — Telephone Encounter (Signed)
Received refill request electronically from pharmacy Last office visit 10/23/14 Last refill 09/30/14 #30/1 Is it okay to refill?

## 2014-12-09 NOTE — Telephone Encounter (Signed)
Rx called to pharmacy as instructed. 

## 2014-12-18 DIAGNOSIS — R21 Rash and other nonspecific skin eruption: Secondary | ICD-10-CM | POA: Diagnosis not present

## 2014-12-18 DIAGNOSIS — Z85828 Personal history of other malignant neoplasm of skin: Secondary | ICD-10-CM | POA: Diagnosis not present

## 2014-12-26 ENCOUNTER — Ambulatory Visit (INDEPENDENT_AMBULATORY_CARE_PROVIDER_SITE_OTHER): Payer: Medicare Other

## 2014-12-26 DIAGNOSIS — Z23 Encounter for immunization: Secondary | ICD-10-CM | POA: Diagnosis not present

## 2014-12-31 DIAGNOSIS — L309 Dermatitis, unspecified: Secondary | ICD-10-CM | POA: Diagnosis not present

## 2014-12-31 DIAGNOSIS — Z85828 Personal history of other malignant neoplasm of skin: Secondary | ICD-10-CM | POA: Diagnosis not present

## 2014-12-31 DIAGNOSIS — L821 Other seborrheic keratosis: Secondary | ICD-10-CM | POA: Diagnosis not present

## 2015-01-27 DIAGNOSIS — Z85828 Personal history of other malignant neoplasm of skin: Secondary | ICD-10-CM | POA: Diagnosis not present

## 2015-01-27 DIAGNOSIS — L309 Dermatitis, unspecified: Secondary | ICD-10-CM | POA: Diagnosis not present

## 2015-02-23 DIAGNOSIS — Z79899 Other long term (current) drug therapy: Secondary | ICD-10-CM | POA: Diagnosis not present

## 2015-02-23 DIAGNOSIS — L309 Dermatitis, unspecified: Secondary | ICD-10-CM | POA: Diagnosis not present

## 2015-02-23 DIAGNOSIS — I788 Other diseases of capillaries: Secondary | ICD-10-CM | POA: Diagnosis not present

## 2015-02-23 DIAGNOSIS — Z85828 Personal history of other malignant neoplasm of skin: Secondary | ICD-10-CM | POA: Diagnosis not present

## 2015-03-17 ENCOUNTER — Ambulatory Visit (INDEPENDENT_AMBULATORY_CARE_PROVIDER_SITE_OTHER): Payer: Medicare Other | Admitting: Family Medicine

## 2015-03-17 ENCOUNTER — Encounter: Payer: Self-pay | Admitting: Family Medicine

## 2015-03-17 VITALS — BP 112/60 | HR 75 | Temp 97.8°F | Wt 153.0 lb

## 2015-03-17 DIAGNOSIS — R42 Dizziness and giddiness: Secondary | ICD-10-CM | POA: Diagnosis not present

## 2015-03-17 NOTE — Patient Instructions (Signed)
Marion will call about your referral. See her on the way out.  Take care.  Glad to see you.  

## 2015-03-17 NOTE — Progress Notes (Signed)
Pre visit review using our clinic review tool, if applicable. No additional management support is needed unless otherwise documented below in the visit note.  "Dizzy."  He had a friend that improved with vestibular rehab and he had questions about it.   D/w pt.   Some days are better than others.   He has episodic sx.  He'll have sx with position changes, with leaning over- and not before he stands back up.  When he straightens up, his sx will get better.   He can't lay under a car, flat, and work on a car now.   He does better sleeping on a bigger pillow, so he isn't flat.   He hasn't passed out.  His exercise tolerance is still good, w/o CP.    He has occ ear stuffiness.    Meds, vitals, and allergies reviewed.   ROS: See HPI.  Otherwise, noncontributory.  GEN: nad, alert and oriented, TM wnl B, OP wnl HEENT: mucous membranes moist NECK: supple w/o LA CV: rrr. PULM: ctab, no inc wob EXT: no edema

## 2015-03-18 NOTE — Assessment & Plan Note (Signed)
D/w pt.  With clearly reproducible sx by hx.  Not concerned for cardiac source.  Reasonable to have him see vestibular rehab.  He agrees. D/w pt.   Refer.  F/u prn.

## 2015-03-19 ENCOUNTER — Ambulatory Visit: Payer: Medicare Other | Attending: Family Medicine | Admitting: Rehabilitative and Restorative Service Providers"

## 2015-03-19 DIAGNOSIS — R269 Unspecified abnormalities of gait and mobility: Secondary | ICD-10-CM | POA: Insufficient documentation

## 2015-03-19 DIAGNOSIS — R42 Dizziness and giddiness: Secondary | ICD-10-CM | POA: Diagnosis not present

## 2015-03-19 NOTE — Patient Instructions (Signed)
Gaze Stabilization: Tip Card 1.Target must remain in focus, not blurry, and appear stationary while head is in motion. 2.Perform exercises with small head movements (45 to either side of midline). 3.Increase speed of head motion so long as target is in focus. 4.If you wear eyeglasses, be sure you can see target through lens (therapist will give specific instructions for bifocal / progressive lenses). 5.These exercises may provoke dizziness or nausea. Work through these symptoms. If too dizzy, slow head movement slightly. Rest between each exercise. 6.Exercises demand concentration; avoid distractions. 7.For safety, perform standing exercises close to a counter, wall, corner, or next to someone.  Copyright  VHI. All rights reserved.  Gaze Stabilization: Standing Feet Apart   Feet shoulder width apart, keeping eyes on target on wall 3 feet away, tilt head down slightly and move head side to side for 30 seconds. Repeat while moving head up and down for 30 seconds. Do 2 sessions per day.   Copyright  VHI. All rights reserved.   Suboccipital Stretch (Supine)    With small towel roll at base of skull and upper neck. Gently tuck chin until stretch is felt at base of skull and upper neck. Hold _5___ seconds. Relax. Repeat _10___ times per set. Do __1__ sets per session. Do _2___ sessions per day.  http://orth.exer.us/984   Copyright  VHI. All rights reserved.   Upper Cervical Flexion Mobilization    Rotate head as far as possible to left. Gently nod head up and down. Repeat __5-10__ times per set, repeat to the right side. Do __2__ sessions per day.  http://orth.exer.us/368   Copyright  VHI. All rights reserved.  Feet Apart (Compliant Surface) Varied Arm Positions - Eyes Closed    Stand on compliant surface: ___pillow_____ with feet shoulder width apart and arms out. Close eyes and visualize upright position. Hold_30___ seconds. Repeat __3__ times per session. Do __2__  sessions per day.  Copyright  VHI. All rights reserved.

## 2015-03-19 NOTE — Therapy (Signed)
[facility] [address] [place], [place], [address] Phone: [phone]   Fax:  [phone]  Physical Therapy Evaluation  Patient Details  Name: Larry [name] MRN: [id number] Date of Birth: September 14, 1929 Referring Provider: Elsie Stain, MD  Encounter Date: 03/19/2015      PT End of Session - 03/19/15 1418    Visit Number 1   Number of Visits 4   Date for PT Re-Evaluation 04/18/15   Authorization Type G code every 10th visit   PT Start Time 1320   PT Stop Time 1410   PT Time Calculation (min) 50 min   Activity Tolerance Patient tolerated treatment well   Behavior During Therapy [facility] for tasks assessed/performed      Past Medical History  Diagnosis Date  . Thrombocytopenia, unspecified ([place])   . Unspecified hypothyroidism   . Family history of diabetes mellitus   . History of chickenpox   . Other and unspecified hyperlipidemia   . Esophageal reflux   . Pre-syncope   . Hypertriglyceridemia     Past Surgical History  Procedure Laterality Date  . Appendectomy  1949    There were no vitals filed for this visit.  Visit Diagnosis:  Dizziness and giddiness  Abnormality of gait      Subjective Assessment - 03/19/15 1323    Subjective The patient reports intermittent episodes of dizziness beginning approximately 6-8 months ago.  He had episode 2 years ago when bending over and felt "I was going away."  He noted spells of near syncope after bending and returning to sitting.  He reports he has been evaluated by primary care, cardiologist, and also had CT scan.  He notes no medical issues detected in examinations.    Patient Stated Goals Reduce dizziness.   Currently in Pain? No/denies            [facility] PT Assessment - 03/19/15 1325    Assessment   Medical Diagnosis dizziness   Referring Provider Elsie Stain, MD   Onset Date/Surgical Date --  6-8 months ago   Prior Therapy Patient performed some  exercises he found online.  He felt they helped.    Precautions   Precautions None   Balance Screen   Has the patient fallen in the past 6 months No   Has the patient had a decrease in activity level because of a fear of falling?  No   Is the patient reluctant to leave their home because of a fear of falling?  No   Home Environment   Living Environment Private residence   Type of Home House   Prior Function   Level of Independence Independent  "I step more carefully."   Observation/Other Assessments   Focus on Therapeutic Outcomes (FOTO)  86%   Other Surveys  --  DHI=28%   ROM / Strength   AROM / PROM / Strength AROM   AROM   Overall AROM Comments Patient with limitations in neck A/ROM rotation to approximately 30 degrees bilaterally.  He has 20 degrees cervical flexion/extension and 15 degrees bilateral sidebending.   Flexibility   Soft Tissue Assessment /Muscle Length yes  tightness noted in cervical musculature and suboccipitals   Ambulation/Gait   Ambulation/Gait Yes   Ambulation/Gait Assistance 7: Independent   Ambulation Distance (Feet) 200 Feet   Assistive device None   Gait Pattern Within Functional Limits   Ambulation Surface Level   Gait velocity 3.64 ft/sec   Balance   Balance Assessed --  foam eyes open x 30 sec, eyes closed x 8 sec            Vestibular Assessment - 03/19/15 1328    Vestibular Assessment   General Observation Patient is independent with ambulation into clinic.   Symptom Behavior   Type of Dizziness --  sensation of passing out   Frequency of Dizziness "almost never", only when bending   Duration of Dizziness seconds   Aggravating Factors --  bending, feels a sensation when he is lying down    Relieving Factors Head stationary   Occulomotor Exam   Occulomotor Alignment Normal   Spontaneous Absent   Gaze-induced Absent   Smooth Pursuits Intact   Saccades Intact   Vestibulo-Occular Reflex   VOR 1 Head Only (x 1 viewing) Patient  unable to maintain gaze x 1 at slow pace patient is unable to maintain gaze fixation and demonstrates corrective saccade back to target.   Comment Patient demonstrates tightness in neck with rotation to approximately 35-40 degrees bilaterally.     Other Tests   Comments Patient notes 3 different types of symptoms 1) general fuzziness/ hard to focus 2) unable to lay flat and look under car 3) presyncopal symptoms when bending and return to stand.   Positional Testing   Dix-Hallpike Dix-Hallpike Right;Dix-Hallpike Left   Sidelying Test Sidelying Right;Sidelying Left   Horizontal Canal Testing Horizontal Canal Right;Horizontal Canal Left   Dix-Hallpike Right   Dix-Hallpike Right Duration Lasts while in position   Dix-Hallpike Right Symptoms No nystagmus viewed, however patient closes eyes to move out of position until encouraged to rest back   Dix-Hallpike Left   Dix-Hallpike Left Duration described as "right before it could come on"   Dix-Hallpike Left Symptoms No nystagmus   Sidelying Right   Sidelying Right Duration none   Sidelying Right Symptoms No nystagmus   Sidelying Left   Sidelying Left Duration none   Sidelying Left Symptoms No nystagmus   Horizontal Canal Right   Horizontal Canal Right Duration none   Horizontal Canal Right Symptoms Normal   Horizontal Canal Left   Horizontal Canal Left Duration none   Horizontal Canal Left Symptoms Normal          [facility] Adult PT Treatment/Exercise - 03/19/15 1357    Neuro Re-ed    Neuro Re-ed Details  Foam standing with eyes closed with feet apart and instructions on performing safely in the home.   Exercises   Exercises Other Exercises   Other Exercises  Supine chin tucks for posterior cervical lengthening + postural stabilization, chin to chest neck flexion x 10 reps, A/ROMm rotation in supine and then in sitting.  Added head nods with neck rotation (dizziness provoked to the left side with movement).         Vestibular  Treatment/Exercise - 03/19/15 1343    Vestibular Treatment/Exercise   Vestibular Treatment Provided Gaze   Gaze Exercises X1 Viewing Horizontal           PT Education - 03/19/15 1418    Education provided Yes   Education Details HEP: gaze x 1 viewing, neck ROM with rotation, chin tucks, pillow standing with eyes closed   Person(s) Educated Patient   Methods Explanation;Demonstration;Handout   Comprehension Verbalized understanding;Returned demonstration          PT Short Term Goals - 03/19/15 1419    PT SHORT TERM GOAL #1   Title STGs=LTGs           PT Long Term  Goals - 03/19/15 1419    PT LONG TERM GOAL #1   Title The patient will return demo HEP for gaze adaptation, high level balance, and postural stabilization.    Baseline Target date 04/18/2015   Time 4   Period Weeks   PT LONG TERM GOAL #2   Title The patient will tolerate standing on compliant foam x 30 seconds with eyes closed (compared to 8 sec at eval) to demo improved use of vestibular inputs for balance.   Baseline Target date 04/18/2015   Time 4   Period Weeks   PT LONG TERM GOAL #3   Title The patient will decrease DHI from 28% to < or equal to 18% to demo decreased self perception of dizziness.   Baseline Target date 04/18/2015   Time 4   Period Weeks   PT LONG TERM GOAL #4   Title The patient will improve neck A/ROM to >45 degrees bilaterally for rotation.   Baseline Target date 04/18/2015               Plan - 03/19/15 1424    Clinical Impression Statement The patient is an 81 yo male presenting to physical therapy with multi-factorial imbalance/dizziness.  He subjectively c/o presyncope with bending, visual blurring, discomfort in supine, and imbalance walking in low light.  He had decreased VOR, tightness in neck/postural muscles, dizziness with head and neck movements in supine.  His symptoms did not appear positional today, as dizziness is constant while in positions (vs. lasting seconds).   The patient's cateracts may also be playing a role in balance as he is visually dependent for balance (difficulty with eyes closed on foam).  PT to progress HEP to improve patient's mobility.   Pt will benefit from skilled therapeutic intervention in order to improve on the following deficits Abnormal gait;Decreased balance;Dizziness;Postural dysfunction;Decreased mobility;Decreased range of motion   Rehab Potential Good   PT Frequency 1x / week   PT Duration 4 weeks   PT Treatment/Interventions ADLs/Self Care Home Management;Neuromuscular re-education;Gait training;Canalith Repostioning;Vestibular;Therapeutic activities;Therapeutic exercise;Manual techniques;Functional mobility training;Patient/family education   PT Next Visit Plan Check HEP, compliant surface training, gaze x 1 viewing, dynamic gait activities, neck stretching/ROM and postural stabilization   Consulted and Agree with Plan of Care Patient         Problem List Patient Active Problem List   Diagnosis Date Noted  . Near syncope 09/24/2014  . Advance care planning 10/03/2013  . Leg swelling 09/18/2013  . Lower GI bleed 03/07/2013  . Rectal bleeding 03/07/2013  . Ischemic colitis ([place]) 03/07/2013  . Chronic dermatitis 08/28/2012  . Shoulder pain 01/25/2012  . Situational anxiety 01/25/2012  . Medicare annual wellness visit, subsequent 08/15/2011  . Vertigo 08/15/2011  . BPH (benign prostatic hyperplasia) 08/15/2011  . RHINITIS 05/11/2010  . HEMORRHOIDS 10/22/2009  . HYPOTHYROIDISM 09/28/2009  . LOW BACK PAIN 09/28/2009  . HLD (hyperlipidemia) 09/25/2009  . UNSPECIFIED THROMBOCYTOPENIA 09/25/2009  . GERD 09/25/2009  . HEADACHE 09/25/2009   Thank you for the referral of this patient. Rudell Cobb, MPT   Cyndia Degraff 03/19/2015, 2:28 PM  [facility] [address] [place], [place], [address] Phone: [phone]   Fax:  [phone]  Name:  Larry [name] MRN: [id number] Date of Birth: 10/06/1929

## 2015-03-30 ENCOUNTER — Ambulatory Visit: Payer: 59 | Admitting: Rehabilitative and Restorative Service Providers"

## 2015-04-06 ENCOUNTER — Ambulatory Visit: Payer: 59 | Admitting: Rehabilitative and Restorative Service Providers"

## 2015-04-09 ENCOUNTER — Ambulatory Visit: Payer: 59 | Attending: Family Medicine | Admitting: Rehabilitative and Restorative Service Providers"

## 2015-04-09 DIAGNOSIS — R269 Unspecified abnormalities of gait and mobility: Secondary | ICD-10-CM | POA: Diagnosis not present

## 2015-04-09 DIAGNOSIS — R42 Dizziness and giddiness: Secondary | ICD-10-CM | POA: Insufficient documentation

## 2015-04-09 NOTE — Patient Instructions (Signed)
Tip Card 1.The goal of habituation training is to assist in decreasing symptoms of vertigo, dizziness, or nausea provoked by specific head and body motions. 2.These exercises may initially increase symptoms; however, be persistent and work through symptoms. With repetition and time, the exercises will assist in reducing or eliminating symptoms. 3.Exercises should be stopped and discussed with the therapist if you experience any of the following: - Sudden change or fluctuation in hearing - New onset of ringing in the ears, or increase in current intensity - Any fluid discharge from the ear - Severe pain in neck or back - Extreme nausea  Copyright  VHI. All rights reserved.  Sit to Side-Lying   Sit on edge of bed. Lie down onto the right side and hold until dizziness stops, plus 20 seconds.  Return to sitting and wait until dizziness stops, plus 20 seconds.  Repeat to the left side. Repeat sequence 5 times per session. Do 2 sessions per day.  Copyright  VHI. All rights reserved.   

## 2015-04-09 NOTE — Therapy (Signed)
[facility] [address] [place], [place], [address] Phone: [phone]   Fax:  [phone]  Physical Therapy Treatment  Patient Details  Name: Larry [name] MRN: [id number] Date of Birth: 1930/02/29 Referring Provider: Elsie Stain, MD  Encounter Date: 04/09/2015      PT End of Session - 04/09/15 1024    Visit Number 2   Number of Visits 4   Date for PT Re-Evaluation 04/18/15   Authorization Type G code every 10th visit   PT Start Time 1018   PT Stop Time 1100   PT Time Calculation (min) 42 min   Activity Tolerance Patient tolerated treatment well   Behavior During Therapy [facility] for tasks assessed/performed      Past Medical History  Diagnosis Date  . Thrombocytopenia, unspecified ([place])   . Unspecified hypothyroidism   . Family history of diabetes mellitus   . History of chickenpox   . Other and unspecified hyperlipidemia   . Esophageal reflux   . Pre-syncope   . Hypertriglyceridemia     Past Surgical History  Procedure Laterality Date  . Appendectomy  1949    There were no vitals filed for this visit.  Visit Diagnosis:  Abnormality of gait  Dizziness and giddiness      Subjective Assessment - 04/09/15 1020    Subjective The patient reports that he feels like he is going to "conk out" when bending forward.  He denies spinning and reports more of a blacking out feeling.  He notes minimal symptoms with exercises.  He hasn't been able to do the exercises this week due to hospitalization of daughter in law.   He had a spell this week while cleaning the kitchen and bent down to get something out of the cabinet.    Patient Stated Goals Reduce dizziness.   Currently in Pain? No/denies          Vestibular Assessment - 04/09/15 1024    Vestibular Assessment   General Observation the patient took 1/4 of a clonipin this morning due to illness in the family.   Positional Testing   Sidelying Test  Sidelying Right;Sidelying Left   Sidelying Right   Sidelying Right Duration seconds   Sidelying Right Symptoms --  reports dizziness "like he is going to black out"   Sidelying Left   Sidelying Left Duration seconds   Sidelying Left Symptoms --  sensation of spinning and like he could "blackout"            Vestibular Treatment/Exercise - 04/09/15 0001    Vestibular Treatment/Exercise   Vestibular Treatment Provided Habituation;Gaze;Canalith Repositioning   Canalith Repositioning Epley Manuever Right   Habituation Exercises Brandt Daroff;Standing Diagonal Head Turns   Gaze Exercises X1 Viewing Horizontal    EPLEY MANUEVER RIGHT   Number of Reps  1   Overall Response --  did not retest/ treated based on symptoms   Nestor Lewandowsky   Number of Reps  5   Symptom Description  Symptoms are worse to the right side and when lying down vs getting up.  Patient is experiencing positional type symptoms, although nystagmus not viewed in room light today (?unsure if clonipin-took 1/4 tablet--would decrease observable nystagmus)   Standing Diagonal Head Turns   Number of Reps  5   Symptiom Description  to the left side emphasizing reaching to look towards shin and return to standing, tried to the right side and patient reported a "popping" sensation behind R knee and leaned towards  countertop.  Sat x 3 minutes to allow symptoms to settle.      Gait: PT and patient ambulated 230 feet x 3 reps with cues on longer stride.  He did not discomfort in his R hamstring after knee "pop" sensation as detailed in assessment.      PT Education - 04/09/15 1108    Education provided Yes   Education Details [id number] daroff habituation exercises   Person(s) Educated Patient   Methods Explanation;Demonstration;Handout   Comprehension Verbalized understanding;Returned demonstration          PT Short Term Goals - 03/19/15 1419    PT SHORT TERM GOAL #1   Title STGs=LTGs           PT Long Term  Goals - 03/19/15 1419    PT LONG TERM GOAL #1   Title The patient will return demo HEP for gaze adaptation, high level balance, and postural stabilization.    Baseline Target date 04/18/2015   Time 4   Period Weeks   PT LONG TERM GOAL #2   Title The patient will tolerate standing on compliant foam x 30 seconds with eyes closed (compared to 8 sec at eval) to demo improved use of vestibular inputs for balance.   Baseline Target date 04/18/2015   Time 4   Period Weeks   PT LONG TERM GOAL #3   Title The patient will decrease DHI from 28% to < or equal to 18% to demo decreased self perception of dizziness.   Baseline Target date 04/18/2015   Time 4   Period Weeks   PT LONG TERM GOAL #4   Title The patient will improve neck A/ROM to >45 degrees bilaterally for rotation.   Baseline Target date 04/18/2015           Plan - 04/09/15 1108    Clinical Impression Statement The patient has had increasing personal stress.  At today's visit he reports he took 1/4 tablet of klonopin due to family stress related to daughter in law having terminal illness and being admitted to ICU.  This medication was not on med list in EPIC.  PT asked if it was newly prescribed and he reports that he had some from past use for dentist or MRI.  PT recommended he contact MD to ensure this medication is okay to add to current list.  At today's session, the patient also had episode of R knee buckling --he was able to catch himself with PT assistance at countertop, but did experience soreness in his R hamstring muscle after this incident.  PT recommended he f/u with MD if pain persists for further assessment and to use ice for muscle soreness.    PT Next Visit Plan F/u re: knee discomfort (R hamstrings); Check HEP, compliant surface training, gaze x 1 viewing, dynamic gait activities, neck stretching/ROM and postural stabilization   Consulted and Agree with Plan of Care Patient        Problem List Patient Active Problem  List   Diagnosis Date Noted  . Near syncope 09/24/2014  . Advance care planning 10/03/2013  . Leg swelling 09/18/2013  . Lower GI bleed 03/07/2013  . Rectal bleeding 03/07/2013  . Ischemic colitis ([place]) 03/07/2013  . Chronic dermatitis 08/28/2012  . Shoulder pain 01/25/2012  . Situational anxiety 01/25/2012  . Medicare annual wellness visit, subsequent 08/15/2011  . Vertigo 08/15/2011  . BPH (benign prostatic hyperplasia) 08/15/2011  . RHINITIS 05/11/2010  . HEMORRHOIDS 10/22/2009  . HYPOTHYROIDISM 09/28/2009  .  LOW BACK PAIN 09/28/2009  . HLD (hyperlipidemia) 09/25/2009  . UNSPECIFIED THROMBOCYTOPENIA 09/25/2009  . GERD 09/25/2009  . HEADACHE 09/25/2009    Larry [name], PT 04/09/2015, 11:27 AM  [facility] [address] [place], [place], [address] Phone: [phone]   Fax:  [phone]  Name: Larry [name] MRN: [id number] Date of Birth: [birth year]

## 2015-04-13 ENCOUNTER — Encounter: Payer: Medicare Other | Admitting: Rehabilitative and Restorative Service Providers"

## 2015-04-15 ENCOUNTER — Telehealth: Payer: Self-pay | Admitting: Family Medicine

## 2015-04-15 NOTE — Telephone Encounter (Signed)
I have limited hours due to an illness.  I've been out of work recently.  Offer him whatever we have for an appointment.  Thanks.

## 2015-04-15 NOTE — Telephone Encounter (Signed)
Likely a hamstring strain or partial tear.  Gently stretch (very gently).  Can use an ace wrap.  Can try ice locally.  Limit exertion/heavy lifting with the leg.  Thanks.

## 2015-04-15 NOTE — Telephone Encounter (Signed)
Patient has an appointment Friday with Dr. Damita Dunnings. Patient stated that he was doing toe touches over a week ago and felt something pull and he almost fell. Patient stated that he noticed that the back of his right leg is bruised from inside of knee joint almost all the way up to his buttock and about the width of his hand. Patient stated that the bruise is a little tender. Patient wants to know what he should do for this until his appointment?

## 2015-04-15 NOTE — Telephone Encounter (Signed)
Pt called wanting to get an appointment to day with dr Damita Dunnings.  He hurt his leg doing therapy.  Dr Damita Dunnings doesn't have anything today offered appointment with someone else pt stated he only wanted to see dr Damita Dunnings.  Offered him an appointment tomorrow pt refused.  Pt wanted lugene to call him today.

## 2015-04-15 NOTE — Telephone Encounter (Signed)
Patient notified as instructed by telephone and verbalized understanding. 

## 2015-04-17 ENCOUNTER — Ambulatory Visit (INDEPENDENT_AMBULATORY_CARE_PROVIDER_SITE_OTHER): Payer: 59 | Admitting: Family Medicine

## 2015-04-17 ENCOUNTER — Encounter: Payer: PPO | Admitting: Rehabilitative and Restorative Service Providers"

## 2015-04-17 ENCOUNTER — Encounter: Payer: Self-pay | Admitting: Family Medicine

## 2015-04-17 VITALS — BP 126/70 | HR 74 | Temp 98.3°F | Wt 155.0 lb

## 2015-04-17 DIAGNOSIS — R42 Dizziness and giddiness: Secondary | ICD-10-CM

## 2015-04-17 DIAGNOSIS — S76311A Strain of muscle, fascia and tendon of the posterior muscle group at thigh level, right thigh, initial encounter: Secondary | ICD-10-CM

## 2015-04-17 MED ORDER — CLONAZEPAM 0.5 MG PO TABS: ORAL_TABLET | ORAL | Status: DC

## 2015-04-17 NOTE — Patient Instructions (Signed)
Ice and ace wrap.  Gently stretch.  Don't do anything that hurts.   Likely a partial hamstring strain.  Take care.   Glad to see you.

## 2015-04-17 NOTE — Progress Notes (Signed)
Pre visit review using our clinic review tool, if applicable. No additional management support is needed unless otherwise documented below in the visit note.  PT isn't helping but small dose of klonopin does help with vertigo.  Med list updated.  He wanted a referral to Dr. Cresenciano Lick, d/w pt.  This is reasonable.  I put in the referral.    Leg bruising.  R leg.  He felt/heard a pop and then had bruising thereafter.  Didn't have pain initially.  He was stretching, doing toe touches at the time of the event.    His daughter in law has been sick with terminal cancer. I offered my condolences.    Meds, vitals, and allergies reviewed.   ROS: See HPI.  Otherwise, noncontributory.  nad ncat R leg bruising posterior.  Not ttp on the hamstring or calf.  No bands or chords.  Normal ROM at the knee.  No masses, no muscle spasms notes Able to bear weight.

## 2015-04-19 DIAGNOSIS — S76319A Strain of muscle, fascia and tendon of the posterior muscle group at thigh level, unspecified thigh, initial encounter: Secondary | ICD-10-CM | POA: Insufficient documentation

## 2015-04-19 NOTE — Assessment & Plan Note (Signed)
Likely a partial hamstring strain, not a full tear.  Ice and ace wrap.  Gently stretch. "Don't do anything that hurts." He'll update me as needed.  Bruise will likely migrate distally and then resolve.

## 2015-04-20 ENCOUNTER — Other Ambulatory Visit: Payer: Self-pay | Admitting: Family Medicine

## 2015-04-20 NOTE — Telephone Encounter (Signed)
Electronic refill request.  There is a refill noted on 04/17/15 but it is listed as No Print.  Please advise.

## 2015-04-21 NOTE — Telephone Encounter (Signed)
Medication phoned to pharmacy.  

## 2015-04-21 NOTE — Telephone Encounter (Signed)
Please call in.  Thanks.   

## 2015-05-22 ENCOUNTER — Encounter: Payer: Self-pay | Admitting: Family Medicine

## 2015-05-22 ENCOUNTER — Ambulatory Visit (INDEPENDENT_AMBULATORY_CARE_PROVIDER_SITE_OTHER): Payer: 59 | Admitting: Family Medicine

## 2015-05-22 VITALS — BP 110/58 | HR 74 | Temp 98.9°F | Wt 161.8 lb

## 2015-05-22 DIAGNOSIS — R6889 Other general symptoms and signs: Secondary | ICD-10-CM | POA: Diagnosis not present

## 2015-05-22 MED ORDER — OSELTAMIVIR PHOSPHATE 75 MG PO CAPS: 75.0000 mg | ORAL_CAPSULE | Freq: Two times a day (BID) | ORAL | Status: DC

## 2015-05-22 NOTE — Patient Instructions (Signed)
Take care.  Glad to see you.  I would keep drinking plenty of fluids and start tamiflu today.  Update me as needed.

## 2015-05-22 NOTE — Progress Notes (Signed)
Pre visit review using our clinic review tool, if applicable. No additional management support is needed unless otherwise documented below in the visit note.  Sx started about 2 days ago.  Sick contacts.  No fevers documented. Felt hot.  No sweats.  But has had diffuse aches.  HA.  No ST.  No ear pain.  Rhinorrhea.  Eyes have been irritated B.  Cough, no sputum.  No swelling.  Appetite is fair, is trying to take plenty of fluids.  He had relatively quick onset of sx.  Taking advil prn along with benadryl.   His wife improved on tamiflu.    Meds, vitals, and allergies reviewed.   ROS: See HPI.  Otherwise, noncontributory.  GEN: nad, alert and oriented HEENT: mucous membranes moist, tm w/o erythema, nasal exam w/o erythema, clear discharge noted,  OP with cobblestoning NECK: supple w/o LA CV: rrr.   PULM: ctab, no inc wob EXT: no edema SKIN: no acute rash

## 2015-05-24 DIAGNOSIS — R6889 Other general symptoms and signs: Secondary | ICD-10-CM | POA: Insufficient documentation

## 2015-05-24 NOTE — Assessment & Plan Note (Signed)
Presumed flu sx.   His wife improved on tamiflu.   Likely contacts in the community.  Start tamiflu and f/u prn.  Supportive care.  Nontoxic.  Okay for outpatient f/u.

## 2015-06-22 ENCOUNTER — Encounter: Payer: Self-pay | Admitting: Rehabilitative and Restorative Service Providers"

## 2015-06-22 NOTE — Therapy (Signed)
[facility] [address] [place], [place], [address] Phone: [phone]   Fax:  [phone]  Patient Details  Name: Larry [name] MRN: [id number] Date of Birth: September 20, 1929 Referring Provider:  No ref. provider found  Encounter Date: last encounter 04/09/2015  PHYSICAL THERAPY DISCHARGE SUMMARY  Visits from Start of Care: 2  Current functional level related to goals / functional outcomes:     PT Short Term Goals - 03/19/15 1419    PT SHORT TERM GOAL #1   Title STGs=LTGs         PT Long Term Goals - 03/19/15 1419    PT LONG TERM GOAL #1   Title The patient will return demo HEP for gaze adaptation, high level balance, and postural stabilization.    Baseline Target date 04/18/2015   Time 4   Period Weeks   PT LONG TERM GOAL #2   Title The patient will tolerate standing on compliant foam x 30 seconds with eyes closed (compared to 8 sec at eval) to demo improved use of vestibular inputs for balance.   Baseline Target date 04/18/2015   Time 4   Period Weeks   PT LONG TERM GOAL #3   Title The patient will decrease DHI from 28% to < or equal to 18% to demo decreased self perception of dizziness.   Baseline Target date 04/18/2015   Time 4   Period Weeks   PT LONG TERM GOAL #4   Title The patient will improve neck A/ROM to >45 degrees bilaterally for rotation.   Baseline Target date 04/18/2015     *Patient did not return to PT--he missed/cancelled sessions due to a terminal illness in his family.  See evaluation and last treatment for patient status.   Education / Equipment: HEP provided.  Plan: Patient agrees to discharge.  Patient goals were not met. Patient is being discharged due to not returning since the last visit.  ?????       Thank you for the referral of this patient. Rudell Cobb, MPT    Dorsey Authement 06/22/2015, 11:27 AM  [facility] [address] [place], [place], [address] Phone: [phone]   Fax:  [phone]

## 2015-08-02 ENCOUNTER — Other Ambulatory Visit: Payer: Self-pay | Admitting: Family Medicine

## 2015-08-18 ENCOUNTER — Other Ambulatory Visit: Payer: Self-pay | Admitting: Family Medicine

## 2015-08-25 DIAGNOSIS — K1370 Unspecified lesions of oral mucosa: Secondary | ICD-10-CM | POA: Diagnosis not present

## 2015-11-04 ENCOUNTER — Emergency Department (HOSPITAL_COMMUNITY)
Admission: EM | Admit: 2015-11-04 | Discharge: 2015-11-04 | Disposition: A | Discharge status: Home / Self Care | Payer: Medicare Other | Attending: Emergency Medicine | Admitting: Emergency Medicine

## 2015-11-04 ENCOUNTER — Emergency Department (HOSPITAL_BASED_OUTPATIENT_CLINIC_OR_DEPARTMENT_OTHER)
Admit: 2015-11-04 | Discharge: 2015-11-04 | Disposition: A | Discharge status: Home / Self Care | Payer: Medicare Other | Attending: Emergency Medicine | Admitting: Emergency Medicine

## 2015-11-04 ENCOUNTER — Encounter (HOSPITAL_COMMUNITY): Payer: Self-pay

## 2015-11-04 DIAGNOSIS — M79609 Pain in unspecified limb: Secondary | ICD-10-CM | POA: Diagnosis not present

## 2015-11-04 DIAGNOSIS — E039 Hypothyroidism, unspecified: Secondary | ICD-10-CM | POA: Insufficient documentation

## 2015-11-04 DIAGNOSIS — Z79899 Other long term (current) drug therapy: Secondary | ICD-10-CM | POA: Diagnosis not present

## 2015-11-04 DIAGNOSIS — Z7982 Long term (current) use of aspirin: Secondary | ICD-10-CM | POA: Diagnosis not present

## 2015-11-04 DIAGNOSIS — M79604 Pain in right leg: Secondary | ICD-10-CM | POA: Diagnosis present

## 2015-11-04 NOTE — ED Provider Notes (Signed)
[facility] DEPT Provider Note   CSN: [id number] Arrival date & time: 11/04/15  1139     History   Chief Complaint Chief Complaint  Patient presents with  . Leg Pain    HPI Sender Starr is a 82 y.o. male.  HPI Patient presents after the acute onset of pain in the right posterior leg. Patient was climbing onto a tracker, felt a pop, and subsequently had pain, difficulty with extending the knee entirely. No hip or ankle pain, no loss of sensation distally. Patient recalls one person present about 6 months ago, with a similar path, and socially discoloration of the proximal posterior lower leg. After physical therapy, the patient returned to normal until today. There is moderate, worse with attempts to straighten leg, otherwise minimal. No medication taken for pain relief. No other new complaints, including chest pain, dyspnea.  Past Medical History:  Diagnosis Date  . Esophageal reflux   . Family history of diabetes mellitus   . History of chickenpox   . Hypertriglyceridemia   . Other and unspecified hyperlipidemia   . Pre-syncope   . Thrombocytopenia, unspecified ([place])   . Unspecified hypothyroidism     Patient Active Problem List   Diagnosis Date Noted  . Flu-like symptoms 05/24/2015  . Hamstring strain 04/19/2015  . Near syncope 09/24/2014  . Advance care planning 10/03/2013  . Leg swelling 09/18/2013  . Lower GI bleed 03/07/2013  . Rectal bleeding 03/07/2013  . Ischemic colitis ([place]) 03/07/2013  . Chronic dermatitis 08/28/2012  . Shoulder pain 01/25/2012  . Situational anxiety 01/25/2012  . Medicare annual wellness visit, subsequent 08/15/2011  . Vertigo 08/15/2011  . BPH (benign prostatic hyperplasia) 08/15/2011  . RHINITIS 05/11/2010  . HEMORRHOIDS 10/22/2009  . HYPOTHYROIDISM 09/28/2009  . LOW BACK PAIN 09/28/2009  . HLD (hyperlipidemia) 09/25/2009  . UNSPECIFIED THROMBOCYTOPENIA 09/25/2009  . GERD 09/25/2009  . HEADACHE 09/25/2009    Past  Surgical History:  Procedure Laterality Date  . APPENDECTOMY  1949       Home Medications    Prior to Admission medications   Medication Sig Start Date End Date Taking? Authorizing Provider  aspirin 81 MG tablet Take 81 mg by mouth daily.   Yes Historical Provider, MD  calcium carbonate (TUMS - DOSED IN MG ELEMENTAL CALCIUM) 500 MG chewable tablet Chew 1 tablet by mouth daily as needed for indigestion or heartburn.   Yes Historical Provider, MD  clonazePAM (KLONOPIN) 0.5 MG tablet TAKE 1/2 TO 1 TABLET BY MOUTH EVERY DAY AS NEEDED FOR VERTIGO Patient taking differently: Take 0.25-0.5 mg by mouth daily as needed (VERTIGO).  04/21/15  Yes Tonia Ghent, MD  Coenzyme Q10 (CO Q 10 PO) Take 1 capsule by mouth daily.   Yes Historical Provider, MD  EPIPEN 2-PAK 0.3 MG/0.3ML SOAJ injection INJECT 0.3 MLS (0.3 MG TOTAL) INTO THE MUSCLE ONCE. 08/18/15  Yes Tonia Ghent, MD  folic acid (FOLVITE) 1 MG tablet Take 1 mg by mouth daily.   Yes Historical Provider, MD  KRILL OIL PO Take 1 tablet by mouth daily.   Yes Historical Provider, MD  methotrexate (RHEUMATREX) 2.5 MG tablet Take 10 mg by mouth once a week.    Yes Historical Provider, MD  Multiple Vitamins-Minerals (MULTIVITAMIN WITH MINERALS) tablet Take 1 tablet by mouth daily.   Yes Historical Provider, MD  Omega-3 Fatty Acids (FISH OIL PO) Take 1 tablet by mouth daily.   Yes Historical Provider, MD  Red Yeast Rice Extract (RED YEAST RICE PO) Take  1 tablet by mouth daily.   Yes Historical Provider, MD  SYNTHROID 50 MCG tablet TAKE 1 TABLET BY MOUTH DAILY. Patient taking differently: TAKE 50 MCG BY MOUTH DAILY. 08/03/15  Yes Tonia Ghent, MD  oseltamivir (TAMIFLU) 75 MG capsule Take 1 capsule (75 mg total) by mouth 2 (two) times daily. Patient not taking: Reported on 11/04/2015 05/22/15   Tonia Ghent, MD    Family History Family History  Problem Relation Age of Onset  . Stroke Mother   . Diabetes Mother     borderline  . Pneumonia  Father   . Hypertension Sister   . Hyperlipidemia Sister   . Alzheimer's disease Brother   . Dementia Brother   . Arthritis Other   . Diabetes Other     1st degree relative  . Hyperlipidemia Other   . Hypertension Other   . Prostate cancer Neg Hx     Social History Social History  Substance Use Topics  . Smoking status: Never Smoker  . Smokeless tobacco: Never Used     Comment: smoked only as a teenage small amount  . Alcohol use No     Comment: very rare     Allergies   Antihistamines, diphenhydramine-type; Atorvastatin; Beef-derived products; Ezetimibe; Other; Simvastatin; and Sulfonamide derivatives   Review of Systems Review of Systems  Constitutional:       Per HPI, otherwise negative  HENT:       Per HPI, otherwise negative  Respiratory:       Per HPI, otherwise negative  Cardiovascular:       Per HPI, otherwise negative  Gastrointestinal: Negative for vomiting.  Endocrine:       Negative aside from HPI  Genitourinary:       Neg aside from HPI   Musculoskeletal:       Per HPI, otherwise negative  Skin: Negative for color change.  Neurological: Negative for syncope.     Physical Exam Updated Vital Signs BP 122/77 (BP Location: Left Arm)   Pulse 68   Temp 98 F (36.7 C) (Oral)   Resp 15   Ht 5\' 4"  (1.626 m)   Wt 150 lb (68 kg)   SpO2 100%   BMI 25.75 kg/m   Physical Exam  Constitutional: He is oriented to person, place, and time. He appears well-developed. No distress.  HENT:  Head: Normocephalic and atraumatic.  Eyes: Conjunctivae and EOM are normal.  Cardiovascular: Normal rate and regular rhythm.   Pulmonary/Chest: Effort normal. No stridor. No respiratory distress.  Abdominal: He exhibits no distension.  Musculoskeletal: He exhibits no edema.       Right hip: Normal.       Right knee: He exhibits decreased range of motion. He exhibits no swelling, no effusion, no ecchymosis, no deformity, no laceration, normal alignment and no LCL  laxity.       Right ankle: Normal.       Legs: Neurological: He is alert and oriented to person, place, and time.  Skin: Skin is warm and dry.  Psychiatric: He has a normal mood and affect.  Nursing note and vitals reviewed.    ED Treatments / Results   Radiology No evidence for DVT, [name]'s cyst  Procedures Procedures (including critical care time)   Initial Impression / Assessment and Plan / ED Course  I have reviewed the triage vital signs and the nursing notes.  Pertinent labs & imaging results that were available during my care of the patient were reviewed  by me and considered in my medical decision making (see chart for details).  Clinical Course    3:34 PM Patient in NAD.  He has no pain. We discussed all findings, need for outpatient orthopedic follow-up. Patient will have Ace wrap applied.  Elderly male with history of prior similar events now presents with new right posterior leg pain. Patient is distally neurovascularly intact, but has some pain with knee extension suggesting musculoskeletal etiology. Ultrasound does not demonstrate acute findings, no fall indicating need for x-ray. Patient discharged after immobilization with Ace wrap to follow-up with orthopedics.   Carmin Muskrat, MD 11/04/15 1535

## 2015-11-04 NOTE — ED Triage Notes (Signed)
Pt c/o R knee and R lower leg(calf) pain x 6 months increasing today.  Pt reports "I was climbing onto my back hoe this morning and felt a pop."  Pain score 10/10 w/ walking.  Pt reports feeling a similar "pop" x 6 months ago while at PT for something unrelated.  Sts he is unable to put pressure on R foot.

## 2015-11-04 NOTE — Discharge Instructions (Signed)
As discussed, your evaluation today has been largely reassuring.  But, it is important that you monitor your condition carefully, and do not hesitate to return to the ED if you develop new, or concerning changes in your condition. ? ?Otherwise, please follow-up with your physician for appropriate ongoing care. ? ?

## 2015-11-04 NOTE — ED Notes (Signed)
PT STATES HE FELT A "POP" BEHIND THE RIGHT KNEE WHILE CLIMBING ONTO A TRACTOR.

## 2015-11-04 NOTE — Progress Notes (Signed)
VASCULAR LAB PRELIMINARY  PRELIMINARY  PRELIMINARY  PRELIMINARY  Right lower extremity venous duplex completed.    Preliminary report:  Right:  No evidence of DVT, superficial thrombosis, or [name]'s cyst.  Trev Boley, RVS 11/04/2015, 2:51 PM

## 2015-11-16 ENCOUNTER — Other Ambulatory Visit (INDEPENDENT_AMBULATORY_CARE_PROVIDER_SITE_OTHER): Payer: 59

## 2015-11-16 ENCOUNTER — Other Ambulatory Visit: Payer: Self-pay | Admitting: Family Medicine

## 2015-11-16 DIAGNOSIS — E785 Hyperlipidemia, unspecified: Secondary | ICD-10-CM

## 2015-11-16 DIAGNOSIS — D696 Thrombocytopenia, unspecified: Secondary | ICD-10-CM | POA: Diagnosis not present

## 2015-11-16 DIAGNOSIS — E039 Hypothyroidism, unspecified: Secondary | ICD-10-CM | POA: Diagnosis not present

## 2015-11-16 LAB — COMPREHENSIVE METABOLIC PANEL
ALBUMIN: 4.3 g/dL (ref 3.5–5.2)
ALK PHOS: 52 U/L (ref 39–117)
ALT: 19 U/L (ref 0–53)
AST: 18 U/L (ref 0–37)
BILIRUBIN TOTAL: 0.6 mg/dL (ref 0.2–1.2)
BUN: 16 mg/dL (ref 6–23)
CALCIUM: 9.2 mg/dL (ref 8.4–10.5)
CO2: 30 meq/L (ref 19–32)
Chloride: 100 mEq/L (ref 96–112)
Creatinine, Ser: 0.8 mg/dL (ref 0.40–1.50)
GFR: 97.83 mL/min (ref >60.00)
Glucose, Bld: 101 mg/dL — ABNORMAL HIGH (ref 70–99)
Potassium: 4.1 mEq/L (ref 3.5–5.1)
Sodium: 136 mEq/L (ref 135–145)
Total Protein: 6.9 g/dL (ref 6.0–8.3)

## 2015-11-16 LAB — CBC WITH DIFFERENTIAL/PLATELET
BASOS PCT: 0.6 % (ref 0.0–3.0)
Basophils Absolute: 0 10*3/uL (ref 0.0–0.1)
EOS ABS: 0.1 10*3/uL (ref 0.0–0.7)
EOS PCT: 2.9 % (ref 0.0–5.0)
HCT: 45.1 % (ref 39.0–52.0)
HEMOGLOBIN: 15.6 g/dL (ref 13.0–17.0)
LYMPHS ABS: 1.4 10*3/uL (ref 0.7–4.0)
Lymphocytes Relative: 28.5 % (ref 12.0–46.0)
MCHC: 34.7 g/dL (ref 30.0–36.0)
MCV: 95.8 fl (ref 78.0–100.0)
MONO ABS: 0.4 10*3/uL (ref 0.1–1.0)
Monocytes Relative: 7.2 % (ref 3.0–12.0)
NEUTROS ABS: 3.1 10*3/uL (ref 1.4–7.7)
NEUTROS PCT: 60.8 % (ref 43.0–77.0)
PLATELETS: 137 10*3/uL — AB (ref 150.0–400.0)
RBC: 4.71 Mil/uL (ref 4.22–5.81)
RDW: 14.3 % (ref 11.5–15.5)
WBC: 5.1 10*3/uL (ref 4.0–10.5)

## 2015-11-16 LAB — LIPID PANEL
CHOLESTEROL: 235 mg/dL — AB (ref 0–200)
HDL: 38.9 mg/dL — AB (ref >39.00)
Total CHOL/HDL Ratio: 6

## 2015-11-16 LAB — LDL CHOLESTEROL, DIRECT: LDL DIRECT: 86 mg/dL

## 2015-11-16 LAB — TSH: TSH: 2.84 u[IU]/mL (ref 0.35–4.50)

## 2015-11-16 NOTE — Addendum Note (Signed)
Addended by: Marchia Bond on: 11/16/2015 08:43 AM   Modules accepted: Orders

## 2015-11-17 ENCOUNTER — Telehealth: Payer: Self-pay

## 2015-11-17 NOTE — Telephone Encounter (Signed)
Please call pt.  Minimal inc in sugar, TG still elevated.  Both are typical for him. Neither is emergent.  LFTs and kidney tests are fine.  Other labs okay/fine.   Will d/w pt about all in detail at the [facility].  Should be okay to try ibuprofen again in the meantime, with food, drink plenty of fluid with use.  Let me know if that isn't helping the leg pain.  Thanks.

## 2015-11-17 NOTE — Telephone Encounter (Signed)
Pt left v/m; pt is taking methotrexate and pt pulled leg muscle 2 weeks ago. When pt saw dermatologist and had labs done liver function was elevated; pt had just taken advil. Pt had labs at [facility] on 10/16/15 and pt wants to know if liver function has gone back to normal. If normal pt wants to know if can start back on Advil for leg pain. Pt request cb.

## 2015-11-17 NOTE — Telephone Encounter (Signed)
Wife advised. 

## 2015-11-18 ENCOUNTER — Other Ambulatory Visit: Payer: 59

## 2015-11-27 ENCOUNTER — Ambulatory Visit (INDEPENDENT_AMBULATORY_CARE_PROVIDER_SITE_OTHER): Payer: 59 | Admitting: Family Medicine

## 2015-11-27 ENCOUNTER — Encounter: Payer: Self-pay | Admitting: Family Medicine

## 2015-11-27 VITALS — BP 116/68 | HR 63 | Temp 97.8°F | Ht 64.0 in | Wt 153.5 lb

## 2015-11-27 DIAGNOSIS — E039 Hypothyroidism, unspecified: Secondary | ICD-10-CM | POA: Diagnosis not present

## 2015-11-27 DIAGNOSIS — S76311D Strain of muscle, fascia and tendon of the posterior muscle group at thigh level, right thigh, subsequent encounter: Secondary | ICD-10-CM

## 2015-11-27 DIAGNOSIS — Z23 Encounter for immunization: Secondary | ICD-10-CM

## 2015-11-27 DIAGNOSIS — E785 Hyperlipidemia, unspecified: Secondary | ICD-10-CM

## 2015-11-27 DIAGNOSIS — Z0000 Encounter for general adult medical examination without abnormal findings: Secondary | ICD-10-CM

## 2015-11-27 DIAGNOSIS — L309 Dermatitis, unspecified: Secondary | ICD-10-CM

## 2015-11-27 DIAGNOSIS — F418 Other specified anxiety disorders: Secondary | ICD-10-CM

## 2015-11-27 DIAGNOSIS — Z91018 Allergy to other foods: Secondary | ICD-10-CM

## 2015-11-27 DIAGNOSIS — Z91014 Allergy to mammalian meats: Secondary | ICD-10-CM

## 2015-11-27 MED ORDER — ALPRAZOLAM 0.25 MG PO TABS
0.1250 mg | ORAL_TABLET | Freq: Two times a day (BID) | ORAL | 1 refills | Status: DC | PRN
Start: 2015-11-27 — End: 2016-06-16

## 2015-11-27 MED ORDER — SYNTHROID 50 MCG PO TABS: ORAL_TABLET | ORAL | 3 refills | Status: DC

## 2015-11-27 MED ORDER — ALPRAZOLAM 0.25 MG PO TABS: 0.1250 mg | ORAL_TABLET | Freq: Two times a day (BID) | ORAL | 1 refills | Status: DC | PRN

## 2015-11-27 NOTE — Patient Instructions (Signed)
Don't change your meds.   Take care.  Glad to see you.  Update me as needed.

## 2015-11-27 NOTE — Progress Notes (Signed)
I have personally reviewed the Medicare Annual Wellness questionnaire and have noted 1. The patient's medical and social history 2. Their use of alcohol, tobacco or illicit drugs 3. Their current medications and supplements 4. The patient's functional ability including ADL's, fall risks, home safety risks and hearing or visual             impairment. 5. Diet and physical activities 6. Evidence for depression or mood disorders  The patients weight, height, BMI have been recorded in the chart and visual acuity is per eye clinic.  I have made referrals, counseling and provided education to the patient based review of the above and I have provided the pt with a written personalized care plan for preventive services.  Provider list updated- see scanned forms.  Routine anticipatory guidance given to patient.  See health maintenance.  Flu 2017 Shingles 2009 PNA UTD Tetanus 2015 Colon CA screening not due given his age, d/w pt.  He agrees.  Prostate cancer screening not due given his age, d/w pt.  He agrees.  Advance directive- wife designated if patient were incapacitated.  Cognitive function addressed- see scanned forms- and if abnormal then additional documentation follows.   MTX per derm clinic.  LFTs normalized.  D/w pt.   Hypothyroidism.  No neck mass or ADE.  Compliant.   Labs d/w pt.   R leg pain after feeling a pop and had ER eval.  Still using wrap in the meantime.  Had used a crutch in the meantime, for about 1 week.  He is walking better, taking tylenol for pain.  More pain with activity but slowly improving overall.   PMH and SH reviewed  Meds, vitals, and allergies reviewed.   ROS: Per HPI.  Unless specifically indicated otherwise in HPI, the patient denies:  General: fever. Eyes: acute vision changes ENT: sore throat Cardiovascular: chest pain Respiratory: SOB GI: vomiting GU: dysuria Musculoskeletal: acute back pain Derm: acute rash Neuro: acute motor  dysfunction Psych: worsening mood Endocrine: polydipsia Heme: bleeding Allergy: hayfever  GEN: nad, alert and oriented HEENT: mucous membranes moist NECK: supple w/o LA CV: rrr. PULM: ctab, no inc wob ABD: soft, +bs EXT: no edema SKIN: no acute rash

## 2015-11-27 NOTE — Progress Notes (Signed)
Pre visit review using our clinic review tool, if applicable. No additional management support is needed unless otherwise documented below in the visit note. 

## 2015-11-29 DIAGNOSIS — Z91014 Allergy to mammalian meats: Secondary | ICD-10-CM | POA: Insufficient documentation

## 2015-11-29 DIAGNOSIS — Z91018 Allergy to other foods: Secondary | ICD-10-CM | POA: Insufficient documentation

## 2015-11-29 NOTE — Assessment & Plan Note (Signed)
He had taken Xanax on a when necessary basis, with some relief, no adverse effect. Taken occasionally for sleep at night. Again no adverse effect. If only needed rarely, then reasonable continue. He usually breaks the tablet in half and is on a low dose.

## 2015-11-29 NOTE — Assessment & Plan Note (Signed)
Flu 2017 Shingles 2009 PNA UTD Tetanus 2015 Colon CA screening not due given his age, d/w pt.  He agrees.  Prostate cancer screening not due given his age, d/w pt.  He agrees.  Advance directive- wife designated if patient were incapacitated.  Cognitive function addressed- see scanned forms- and if abnormal then additional documentation follows.

## 2015-11-29 NOTE — Assessment & Plan Note (Signed)
He has chronically elevated triglycerides. This is likely familial. Statin intolerant. Continue work on diet and exercise. No change in meds today. He agrees.

## 2015-11-29 NOTE — Assessment & Plan Note (Signed)
He should slowly improve. Discussed with patient.

## 2015-11-29 NOTE — Assessment & Plan Note (Signed)
Continue as is. TSH normal. No thyromegaly on exam. Discussed with patient. He agrees.

## 2015-11-29 NOTE — Assessment & Plan Note (Signed)
Still on methotrexate. LFTs have normalized. Discussed with patient. We didn't change his medicines at this point.

## 2015-12-01 ENCOUNTER — Telehealth: Payer: Self-pay

## 2015-12-01 ENCOUNTER — Telehealth: Payer: Self-pay | Admitting: Family Medicine

## 2015-12-01 MED ORDER — DICLOFENAC SODIUM 1 % TD GEL: 2.0000 g | Freq: Four times a day (QID) | TRANSDERMAL | 2 refills | Status: DC | PRN

## 2015-12-01 NOTE — Telephone Encounter (Signed)
Should be okay to use as a topical.  I wouldn't expect him to have enough absorption to make a sig change.  Thanks.

## 2015-12-01 NOTE — Telephone Encounter (Signed)
Reasonable to try.  Wouldn't take advil and voltaren concurrently.  Thanks.  rx sent.

## 2015-12-01 NOTE — Telephone Encounter (Signed)
Patient notified as instructed by telephone and verbalized understanding. 

## 2015-12-01 NOTE — Telephone Encounter (Signed)
Pt left v/m; pt seen 11/27/15; pt injured knee and leg muscle; pt taking advil and pt was concerned taking too much advil; pt started taling tylenol that did not help pain at all. Pt's daughter had voltaren gel and pt wanted to know if Dr Damita Dunnings would prescribe the voltaren gel for pts leg pain to [facility]. Pt request cb.

## 2015-12-01 NOTE — Telephone Encounter (Signed)
Pt called stating that dr Damita Dunnings prescribed him a medication called diclofenac which he says has a warning that it will interact with his methotrexate.  He wants to know if this medication is ok to take.  Can you please call him.  He can be reached at [phone]

## 2015-12-01 NOTE — Telephone Encounter (Signed)
Message left for patient to return my call.  

## 2015-12-02 MED ORDER — TRAMADOL HCL 50 MG PO TABS: 50.0000 mg | ORAL_TABLET | Freq: Three times a day (TID) | ORAL | 0 refills | Status: DC | PRN

## 2015-12-02 NOTE — Telephone Encounter (Signed)
Pt returned your call please call [phone] thanks

## 2015-12-02 NOTE — Telephone Encounter (Signed)
I would try tramadol.  I updated his med list. Please call in the tramadol, sedation caution.  Thanks.

## 2015-12-02 NOTE — Telephone Encounter (Signed)
Patient notified as instructed by telephone and verbalized understanding. Patient stated that the topical medication is not helping his pain. Patient stated that he has used it 4 times without any relief. Patient stated that he had to take Xanax and Tylenol to help him sleep last night because of the pain and wants to know if you can give him something stronger?

## 2015-12-02 NOTE — Telephone Encounter (Signed)
Patient notified as instructed by telephone and verbalized understanding.  Patient requested to cancel his appointment tomorrow since Dr. Damita Dunnings prescribed the pain medication. Patient stated that he will see how the new pain medication helps and will call back and schedule an appointment if he is still having pain. Appointment cancelled per patient's request.

## 2015-12-03 ENCOUNTER — Ambulatory Visit: Payer: 59 | Admitting: Family Medicine

## 2015-12-05 ENCOUNTER — Emergency Department (HOSPITAL_COMMUNITY)
Admission: EM | Admit: 2015-12-05 | Discharge: 2015-12-05 | Disposition: A | Discharge status: Home / Self Care | Payer: Medicare Other | Attending: Emergency Medicine | Admitting: Emergency Medicine

## 2015-12-05 ENCOUNTER — Encounter (HOSPITAL_COMMUNITY): Payer: Self-pay | Admitting: Emergency Medicine

## 2015-12-05 DIAGNOSIS — R55 Syncope and collapse: Secondary | ICD-10-CM | POA: Diagnosis present

## 2015-12-05 DIAGNOSIS — Z7982 Long term (current) use of aspirin: Secondary | ICD-10-CM | POA: Diagnosis not present

## 2015-12-05 DIAGNOSIS — Z79899 Other long term (current) drug therapy: Secondary | ICD-10-CM | POA: Insufficient documentation

## 2015-12-05 DIAGNOSIS — E039 Hypothyroidism, unspecified: Secondary | ICD-10-CM | POA: Insufficient documentation

## 2015-12-05 LAB — URINALYSIS, ROUTINE W REFLEX MICROSCOPIC
BILIRUBIN URINE: NEGATIVE
Glucose, UA: NEGATIVE mg/dL
Hgb urine dipstick: NEGATIVE
KETONES UR: NEGATIVE mg/dL
LEUKOCYTES UA: NEGATIVE
Nitrite: NEGATIVE
Protein, ur: NEGATIVE mg/dL
Specific Gravity, Urine: 1.006 (ref 1.005–1.030)
pH: 7 (ref 5.0–8.0)

## 2015-12-05 LAB — CBC
HCT: 45.5 % (ref 39.0–52.0)
Hemoglobin: 15.9 g/dL (ref 13.0–17.0)
MCH: 33.5 pg (ref 26.0–34.0)
MCHC: 34.9 g/dL (ref 30.0–36.0)
MCV: 95.8 fL (ref 78.0–100.0)
PLATELETS: 124 10*3/uL — AB (ref 150–400)
RBC: 4.75 MIL/uL (ref 4.22–5.81)
RDW: 14 % (ref 11.5–15.5)
WBC: 5.2 10*3/uL (ref 4.0–10.5)

## 2015-12-05 LAB — BASIC METABOLIC PANEL
Anion gap: 7 (ref 5–15)
BUN: 14 mg/dL (ref 6–20)
CALCIUM: 9 mg/dL (ref 8.9–10.3)
CO2: 27 mmol/L (ref 22–32)
CREATININE: 0.78 mg/dL (ref 0.61–1.24)
Chloride: 100 mmol/L — ABNORMAL LOW (ref 101–111)
GFR calc non Af Amer: >60 mL/min (ref >60)
Glucose, Bld: 109 mg/dL — ABNORMAL HIGH (ref 65–99)
Potassium: 4 mmol/L (ref 3.5–5.1)
SODIUM: 134 mmol/L — AB (ref 135–145)

## 2015-12-05 LAB — TROPONIN I

## 2015-12-05 LAB — CBG MONITORING, ED: GLUCOSE-CAPILLARY: 107 mg/dL — AB (ref 65–99)

## 2015-12-05 MED ORDER — SODIUM CHLORIDE 0.9 % IV BOLUS (SEPSIS): 500.0000 mL | Freq: Once | INTRAVENOUS | Status: DC

## 2015-12-05 NOTE — ED Provider Notes (Signed)
Emergency Department Provider Note   I have reviewed the triage vital signs and the nursing notes.   HISTORY  Chief Complaint Medication Reaction   HPI Larry [name] is a 82 y.o. male with PMH of HLD presents emergency permit for evaluation of syncope. The patient was walking across his bedroom when he suddenly began feeling lightheaded like "I was going to go out." He denies any chest pain, difficult breathing, diaphoresis, or flushing sensation prior to this feeling. He states he slumped down over the bed but does not feel he fully passed out. The symptoms passed after approximately 1 minute and he was able to alert his wife. He had an episode of syncope 2 years ago that was thought to be secondary to dehydration. His no known history of cardiac arrhythmia or congestive heart failure. He has been in his normal state of health but has started a new medication, tramadol, for right lower extremity pain.    Past Medical History:  Diagnosis Date  . Esophageal reflux   . Family history of diabetes mellitus   . History of chickenpox   . Hypertriglyceridemia   . Other and unspecified hyperlipidemia   . Pre-syncope   . Thrombocytopenia, unspecified ([place])   . Unspecified hypothyroidism     Patient Active Problem List   Diagnosis Date Noted  . 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 Hamstring strain 04/19/2015  . Near syncope 09/24/2014  . Advance care planning 10/03/2013  . Leg swelling 09/18/2013  . Lower GI bleed 03/07/2013  . Rectal bleeding 03/07/2013  . Ischemic colitis ([place]) 03/07/2013  . Chronic dermatitis 08/28/2012  . Shoulder pain 01/25/2012  . Situational anxiety 01/25/2012  . Medicare annual wellness visit, subsequent 08/15/2011  . Vertigo 08/15/2011  . BPH (benign prostatic hyperplasia) 08/15/2011  . RHINITIS 05/11/2010  . HEMORRHOIDS 10/22/2009  . Hypothyroidism 09/28/2009  . LOW BACK PAIN 09/28/2009  . HLD (hyperlipidemia) 09/25/2009  . UNSPECIFIED  THROMBOCYTOPENIA 09/25/2009  . GERD 09/25/2009  . HEADACHE 09/25/2009    Past Surgical History:  Procedure Laterality Date  . APPENDECTOMY  1949    Current Outpatient Rx  . Order #: [id number] Class: Print  . Order #: [id number] Class: Historical Med  . Order #: [id number] Class: Historical Med  . Order #: [id number] Class: Historical Med  . Order #: [id number] Class: Normal  . Order #: [id number] Class: Historical Med  . Order #: [id number] Class: Historical Med  . Order #: [id number] Class: Historical Med  . Order #: [id number] Class: Historical Med  . Order #: [id number] Class: Historical Med  . Order #: [id number] Class: Historical Med  . Order #: [id number] Class: Normal  . Order #: [id number] Class: Phone In    Allergies Antihistamines, diphenhydramine-type; Atorvastatin; Beef-derived products; Ezetimibe; Other; Simvastatin; and Sulfonamide derivatives  Family History  Problem Relation Age of Onset  . Stroke Mother   . Diabetes Mother     borderline  . Pneumonia Father   . Hypertension Sister   . Hyperlipidemia Sister   . Alzheimer's disease Brother   . Dementia Brother   . Liver disease Sister     cirrhosis, nondrinker  . Arthritis Other   . Diabetes Other     1st degree relative  . Hyperlipidemia Other   . Hypertension Other   . Prostate cancer Neg Hx   . Colon cancer Neg Hx     Social History Social History  Substance Use Topics  . Smoking status: Never Smoker  . Smokeless tobacco: Never Used     Comment: smoked only as a teenage  small amount  . Alcohol use No     Comment: very rare    Review of Systems  Constitutional: No fever/chills. Eyes: No visual changes. ENT: No sore throat. Cardiovascular: Denies chest pain. Positive Syncope. Respiratory: Denies shortness of breath. Gastrointestinal: No abdominal pain.  No nausea, no vomiting.  No diarrhea.  No constipation. Genitourinary: Negative for dysuria. Musculoskeletal: Negative for back pain. Skin: Negative for  rash. Neurological: Negative for headaches, focal weakness or numbness.  10-point ROS otherwise negative.  ____________________________________________   PHYSICAL EXAM:  VITAL SIGNS: ED Triage Vitals [12/05/15 1208]  Enc Vitals Group     BP 134/100     Pulse Rate 77     Resp 18     Temp 97.9 F (36.6 C)     Temp Source Oral     SpO2 95 %   Constitutional: Alert and oriented. Well appearing and in no acute distress. Eyes: Conjunctivae are normal.  Head: Atraumatic. Nose: No congestion/rhinnorhea. Mouth/Throat: Mucous membranes are moist.  Oropharynx non-erythematous. Neck: No stridor. Cardiovascular: Normal rate, regular rhythm. Good peripheral circulation. Grossly normal heart sounds.   Respiratory: Normal respiratory effort.  No retractions. Lungs CTAB. Gastrointestinal: Soft and nontender. No distention.  Musculoskeletal: No lower extremity tenderness nor edema. No gross deformities of extremities. Neurologic:  Normal speech and language. No gross focal neurologic deficits are appreciated.  Skin:  Skin is warm, dry and intact. No rash noted.   ____________________________________________   LABS (all labs ordered are listed, but only abnormal results are displayed)  Labs Reviewed  BASIC METABOLIC PANEL - Abnormal; Notable for the following:       Result Value   Sodium 134 (*)    Chloride 100 (*)    Glucose, Bld 109 (*)    All other components within normal limits  CBC - Abnormal; Notable for the following:    Platelets 124 (*)    All other components within normal limits  CBG MONITORING, ED - Abnormal; Notable for the following:    Glucose-Capillary 107 (*)    All other components within normal limits  URINALYSIS, ROUTINE W REFLEX MICROSCOPIC (NOT AT [facility])  TROPONIN I   ____________________________________________  EKG   EKG Interpretation  Date/Time:  Saturday December 05 2015 12:29:45 EDT Ventricular Rate:  66 PR Interval:    QRS Duration: 110 QT  Interval:  411 QTC Calculation: 431 R Axis:   37 Text Interpretation:  Sinus rhythm Baseline wander in lead(s) V3 No STEMI. Similar to prior.  Confirmed by Letha Mirabal MD, [place] [phone]) on 12/05/2015 1:08:01 PM       ____________________________________________  RADIOLOGY  None ____________________________________________   PROCEDURES  Procedure(s) performed:   Procedures  None ____________________________________________   INITIAL IMPRESSION / ASSESSMENT AND PLAN / ED COURSE  Pertinent labs & imaging results that were available during my care of the patient were reviewed by me and considered in my medical decision making (see chart for details).  Patient resents the emergency department for evaluation of near syncope event at home. No vertigo. Intact neurological exam. Patient has started tramadol recently and has been feeling over medicated by cut dose in 1/2. No specific concerning findings on EKG. Normal physical exam. No orthostasis. Labs unremarkable. Discussed case and my impression in detail with the patient and wife. They will follow up with their PCP on Monday for outpatient w/u and return to the ED with any CP, SOB, or additional syncope. Discussed discontinuing tramadol and using topical pain medication and Tylenol at  home for pain. Discouraged Motrin use.   At this time, I do not feel there is any life-threatening condition present. I have reviewed and discussed all results (EKG, imaging, lab, urine as appropriate), exam findings with patient. I have reviewed nursing notes and appropriate previous records.  I feel the patient is safe to be discharged home without further emergent workup. Discussed usual and customary return precautions. Patient and family (if present) verbalize understanding and are comfortable with this plan.  Patient will follow-up with their primary care provider. If they do not have a primary care provider, information for follow-up has been provided to  them. All questions have been answered.  _______________________________________  FINAL CLINICAL IMPRESSION(S) / ED DIAGNOSES  Final diagnoses:  Syncope and collapse     MEDICATIONS GIVEN DURING THIS VISIT:  None  NEW OUTPATIENT MEDICATIONS STARTED DURING THIS VISIT:  None   Note:  This document was prepared using Dragon voice recognition software and may include unintentional dictation errors.  Nanda Quinton, MD Emergency Medicine   Margette Fast, MD 12/05/15 [phone]

## 2015-12-05 NOTE — Discharge Instructions (Signed)
You have been seen today in the Emergency Department (ED)  for syncope (passing out).  Your workup including labs and EKG show reassuring results.  It is important that you drink plenty of non-alcoholic fluids.  Please call your regular doctor as soon as possible to schedule the next available clinic appointment to follow up with him/her regarding your visit to the ED and your symptoms.  Return to the Emergency Department (ED)  if you have any further syncopal episodes (pass out again) or develop ANY chest pain, pressure, tightness, trouble breathing, sudden sweating, or other symptoms that concern you.

## 2015-12-05 NOTE — ED Triage Notes (Signed)
Per pt, states he started taking tramadol for right leg pain-states he started to become dizzy-states he cut dose in half because he felt to sedated on full dose-states nurse on call told him to come to ED-took 2.5 Xanax at 1000 because of increased anxiety due to med reaction at 0930

## 2015-12-06 ENCOUNTER — Telehealth: Payer: Self-pay | Admitting: Family Medicine

## 2015-12-06 NOTE — Telephone Encounter (Signed)
Please call pt.  Recently seen at ER.  Has stopped tramadol. Thanks.

## 2015-12-07 ENCOUNTER — Telehealth: Payer: Self-pay

## 2015-12-07 NOTE — Telephone Encounter (Signed)
See other phone note. Please get update on patient.  Thanks.

## 2015-12-07 NOTE — Telephone Encounter (Signed)
PLEASE NOTE: All timestamps contained within this report are represented as [place] Standard Time. CONFIDENTIALTY NOTICE: This fax transmission is intended only for the addressee. It contains information that is legally privileged, confidential or otherwise protected from use or disclosure. If you are not the intended recipient, you are strictly prohibited from reviewing, disclosing, copying using or disseminating any of this information or taking any action in reliance on or regarding this information. If you have received this fax in error, please notify us immediately by telephone so that we can arrange for its return to [place]. Phone: [phone], Toll-Free: [phone], Fax: [phone] Page: 1 of 2 Call Id: [id number] [place] Patient Name: Larry [name] Gender: Male DOB: November 19, 1929 Age: 82 Y 3 M 14 D Return Phone Number: [phone] (Primary) Address: City/State/Zip: [place] Client [place] Night - Client Client Site [place] Physician Renford Dills - MD Contact Type Call Who Is Calling Patient / Member / Family / Caregiver Call Type Triage / Clinical Relationship To Patient Self Return Phone Number [phone] (Primary) Chief Complaint Dizziness Reason for Call Symptomatic / Request for [place] states he was put on Tramidol for pain in his leg. He started taking half the dose for it to work better for him. This morning after taking the second half of the medicine, he got very dizzy and lightheaded. He has only been on it for three days. He wants to know if he can take Zanex w/ the pain med. PreDisposition Home Care Translation No Nurse Assessment Nurse: Mills-Hernandez, RN, Izora Gala Date/Time (Eastern Time): 12/05/2015 9:57:35 AM Confirm and document reason for call. If symptomatic, describe  symptoms. You must click the next button to save text entered. ---Caller states that he was put on Tramdol for leg pain and has only been on it for 2-3 days. This morning after taking his medication he had some dizziness and lightheadedness. Has the patient traveled out of the country within the last 30 days? ---Not Applicable Does the patient have any new or worsening symptoms? ---Yes Will a triage be completed? ---Yes Related visit to physician within the last 2 weeks? ---Yes Does the PT have any chronic conditions? (i.e. diabetes, asthma, etc.) ---No Is this a behavioral health or substance abuse call? ---No Guidelines Guideline Title Affirmed Question Affirmed Notes Nurse Date/Time (Eastern Time) Dizziness - Lightheadedness [1] MODERATE dizziness (e.g., interferes with normal activities) AND [2] has NOT been evaluated by physician for this (Exception: dizziness caused by heat exposure, Mills-Hernandez, RN, Izora Gala 12/05/2015 10:03:03 AM PLEASE NOTE: All timestamps contained within this report are represented as [place] Standard Time. CONFIDENTIALTY NOTICE: This fax transmission is intended only for the addressee. It contains information that is legally privileged, confidential or otherwise protected from use or disclosure. If you are not the intended recipient, you are strictly prohibited from reviewing, disclosing, copying using or disseminating any of this information or taking any action in reliance on or regarding this information. If you have received this fax in error, please notify us immediately by telephone so that we can arrange for its return to [place]. Phone: [phone], Toll-Free: [phone], Fax: [phone] Page: 2 of 2 Call Id: [id number] Guidelines Guideline Title Affirmed Question Affirmed Notes Nurse Date/Time Eilene Ghazi Time) sudden standing, or poor fluid intake) Disp. Time Eilene Ghazi Time) Disposition Final User 12/05/2015 10:09:54 AM See Physician within  24 Hours Yes Mills-Hernandez, RN, Izora Gala  Caller Understands: Yes Disagree/Comply: Comply Care Advice Given Per Guideline SEE PHYSICIAN WITHIN 24 HOURS: * IF OFFICE WILL BE OPEN: You need to be seen within the next 24 hours. Call your doctor when the office opens, and make an appointment. FLUIDS: Drink several glasses of fruit juice, other clear fluids or water. This will improve hydration and blood glucose. If the weather is hot, make sure the fluids are cold. REST: Lie down with feet elevated for 1 hour. This will improve circulation and increase blood flow to the brain. CALL BACK IF: * Passes out (faints) * You become worse. CARE ADVICE given per Dizziness (Adult) guideline. Comments User: Haroldine Laws, RN Date/Time Eilene Ghazi Time): 12/05/2015 10:09:47 AM Nurse did attempt to get caller appt at the [facility] office but they were full so he had to be referred to [facility] or ED Referrals [facility]

## 2015-12-07 NOTE — Telephone Encounter (Signed)
Patient says he is feeling much better.  Patient says he believes the problems were from Tramadol.  Discontinued the Tramadol but pain is much better now.

## 2015-12-07 NOTE — Telephone Encounter (Signed)
Per chart review pt was seen [facility] ED on 12/05/15.

## 2015-12-07 NOTE — Telephone Encounter (Signed)
Noted, thanks, glad he is doing better.  Thanks for checking on him.  Tramadol added to intolerance list.

## 2016-01-27 ENCOUNTER — Other Ambulatory Visit: Payer: Self-pay | Admitting: Family Medicine

## 2016-06-01 ENCOUNTER — Telehealth: Payer: Self-pay | Admitting: Family Medicine

## 2016-06-01 NOTE — Telephone Encounter (Signed)
Patient Name: Larry [name]  DOB: 08-01-1929    Initial Comment Caller states husband fell, hit head on a piece of wood.    Nurse Assessment  Nurse: Thad Ranger RN, Denise Date/Time (Eastern Time): 06/01/2016 1:45:17 PM  Confirm and document reason for call. If symptomatic, describe symptoms. ---Caller states husband fell, hit head on a piece of wood.  Does the patient have any new or worsening symptoms? ---Yes  Will a triage be completed? ---Yes  Related visit to physician within the last 2 weeks? ---No  Does the PT have any chronic conditions? (i.e. diabetes, asthma, etc.) ---No  Is this a behavioral health or substance abuse call? ---No     Guidelines    Guideline Title Affirmed Question Affirmed Notes  Head Injury Scalp swelling, bruise or pain (all triage questions negative)    Final Disposition User   [place], RN, Langley Gauss    Disagree/Comply: Comply

## 2016-06-16 ENCOUNTER — Other Ambulatory Visit: Payer: Self-pay | Admitting: Family Medicine

## 2016-06-16 NOTE — Telephone Encounter (Signed)
Received refill request electronically Last refill 11/27/15 #30/1 Last office visit 11/27/15

## 2016-06-18 NOTE — Telephone Encounter (Signed)
Please call in.  Thanks.   

## 2016-06-20 NOTE — Telephone Encounter (Signed)
Rx called to pharmacy as instructed. 

## 2016-11-24 ENCOUNTER — Telehealth: Payer: Self-pay | Admitting: *Deleted

## 2016-11-24 NOTE — Telephone Encounter (Signed)
Left message on patient's voicemail to return call

## 2016-11-24 NOTE — Telephone Encounter (Signed)
I don't see any recent notes from his allergist.  I need more details from the allergy clinic.  If I can get the correct lab request from them we can see about getting this done.  Otherwise I can't order it.  Thanks.

## 2016-11-24 NOTE — Telephone Encounter (Signed)
Pt left message at Triage. He said he was diagnoses with alpha-gal due to a tick bite. He said his allergist doctor recommended that he have the full panel for this illness done next week when he comes in for his AWV and lab work on 11/29/16, pt is requesting that you order this lab test so he doesn't have to go to his allergist for this lab work. Pt request call back if there is any problems with this request

## 2016-11-25 ENCOUNTER — Encounter: Payer: Self-pay | Admitting: *Deleted

## 2016-11-25 NOTE — Telephone Encounter (Signed)
Letter mailed

## 2016-11-27 ENCOUNTER — Other Ambulatory Visit: Payer: Self-pay | Admitting: Family Medicine

## 2016-11-27 DIAGNOSIS — E785 Hyperlipidemia, unspecified: Secondary | ICD-10-CM

## 2016-11-27 DIAGNOSIS — E039 Hypothyroidism, unspecified: Secondary | ICD-10-CM

## 2016-11-28 NOTE — Telephone Encounter (Signed)
Patient returned Lugene's call.  Patient can be reached at [phone].

## 2016-11-28 NOTE — Telephone Encounter (Signed)
Spoke with patient who says that it is not the allergy clinic that wants the labs, he himself is requesting it.  It is called alpha-gal.  I explained that you needed more info in terms of what to order.  Patient says he will contact the allergy clinic and see if they can fax [place] the lab report that included that information.

## 2016-11-29 ENCOUNTER — Ambulatory Visit (INDEPENDENT_AMBULATORY_CARE_PROVIDER_SITE_OTHER): Payer: Medicare Other

## 2016-11-29 VITALS — BP 148/50 | HR 59 | Temp 97.9°F

## 2016-11-29 DIAGNOSIS — Z0000 Encounter for general adult medical examination without abnormal findings: Secondary | ICD-10-CM | POA: Diagnosis not present

## 2016-11-29 DIAGNOSIS — E785 Hyperlipidemia, unspecified: Secondary | ICD-10-CM | POA: Diagnosis not present

## 2016-11-29 DIAGNOSIS — Z91014 Allergy to mammalian meats: Secondary | ICD-10-CM

## 2016-11-29 DIAGNOSIS — E039 Hypothyroidism, unspecified: Secondary | ICD-10-CM | POA: Diagnosis not present

## 2016-11-29 DIAGNOSIS — Z91018 Allergy to other foods: Secondary | ICD-10-CM

## 2016-11-29 LAB — COMPREHENSIVE METABOLIC PANEL
ALT: 21 U/L (ref 0–53)
AST: 21 U/L (ref 0–37)
Albumin: 4.2 g/dL (ref 3.5–5.2)
Alkaline Phosphatase: 54 U/L (ref 39–117)
BUN: 14 mg/dL (ref 6–23)
CHLORIDE: 102 meq/L (ref 96–112)
CO2: 28 meq/L (ref 19–32)
CREATININE: 0.81 mg/dL (ref 0.40–1.50)
Calcium: 9.3 mg/dL (ref 8.4–10.5)
GFR: 96.2 mL/min (ref >60.00)
Glucose, Bld: 100 mg/dL — ABNORMAL HIGH (ref 70–99)
POTASSIUM: 4.3 meq/L (ref 3.5–5.1)
SODIUM: 137 meq/L (ref 135–145)
Total Bilirubin: 0.6 mg/dL (ref 0.2–1.2)
Total Protein: 6.6 g/dL (ref 6.0–8.3)

## 2016-11-29 LAB — LDL CHOLESTEROL, DIRECT: LDL DIRECT: 81 mg/dL

## 2016-11-29 LAB — LIPID PANEL
CHOL/HDL RATIO: 6
Cholesterol: 242 mg/dL — ABNORMAL HIGH (ref 0–200)
HDL: 39.4 mg/dL (ref >39.00)
Triglycerides: 481 mg/dL — ABNORMAL HIGH (ref 0.0–149.0)

## 2016-11-29 LAB — TSH: TSH: 2.25 u[IU]/mL (ref 0.35–4.50)

## 2016-11-29 NOTE — Progress Notes (Signed)
PCP notes:   Health maintenance:  Flu vaccine - addressed  Abnormal screenings:   None  Patient concerns:   Patient requested to have labs related beef allergy. Dr. Rush Landmark office was contacted for correct identification of labs. Lab orders for Alpha gal IGE, pork allergen, and beef IGE were generated after verbal approval was granted by PCP.   Nurse concerns:  None  Next PCP appt:   12/05/16 @ 1045  I reviewed health advisor's note, was available for consultation on the day of service listed in this note, and agree with documentation and plan. Elsie Stain, MD.

## 2016-11-29 NOTE — Progress Notes (Signed)
Pre visit review using our clinic review tool, if applicable. No additional management support is needed unless otherwise documented below in the visit note. 

## 2016-11-29 NOTE — Telephone Encounter (Signed)
I know he has alpha gal, but I need more information from the allergy clinic re: testing.  I didn't put in extra orders at this point. Thanks.

## 2016-11-29 NOTE — Progress Notes (Signed)
Subjective:   Larry [name] is a 83 y.o. male who presents for Medicare Annual/Subsequent preventive examination.  Review of Systems:  N/A Cardiac Risk Factors include: advanced age (>81men, >47 women);male gender;dyslipidemia     Objective:    Vitals: BP (!) 148/50 (BP Location: Right Arm, Patient Position: Sitting, Cuff Size: Normal)   Pulse (!) 59   Temp 97.9 F (36.6 C) (Oral)   SpO2 96%   There is no height or weight on file to calculate BMI.  Tobacco History  Smoking Status  . Never Smoker  Smokeless Tobacco  . Never Used    Comment: smoked only as a teenage small amount     Counseling given: No   Past Medical History:  Diagnosis Date  . Esophageal reflux   . Family history of diabetes mellitus   . History of chickenpox   . Hypertriglyceridemia   . Other and unspecified hyperlipidemia   . Pre-syncope   . Thrombocytopenia, unspecified ([place])   . Unspecified hypothyroidism    Past Surgical History:  Procedure Laterality Date  . APPENDECTOMY  1949   Family History  Problem Relation Age of Onset  . Stroke Mother   . Diabetes Mother        borderline  . Pneumonia Father   . Hypertension Sister   . Hyperlipidemia Sister   . Alzheimer's disease Brother   . Dementia Brother   . Liver disease Sister        cirrhosis, nondrinker  . Arthritis Other   . Diabetes Other        1st degree relative  . Hyperlipidemia Other   . Hypertension Other   . Prostate cancer Neg Hx   . Colon cancer Neg Hx    History  Sexual Activity  . Sexual activity: Not on file    Outpatient Encounter Prescriptions as of 11/29/2016  Medication Sig  . ALPRAZolam (XANAX) 0.25 MG tablet TAKE 1/2 TABLET BY MOUTH TWICE DAILY AS NEEDED FOR ANXIETY  . aspirin 81 MG tablet Take 81 mg by mouth daily.  . calcium carbonate (TUMS - DOSED IN MG ELEMENTAL CALCIUM) 500 MG chewable tablet Chew 1 tablet by mouth daily as needed for indigestion or heartburn.  . Coenzyme Q10 (CO Q 10 PO) Take  1 capsule by mouth daily.  Marland Kitchen EPIPEN 2-PAK 0.3 MG/0.3ML SOAJ injection INJECT 0.3 MLS (0.3 MG TOTAL) INTO THE MUSCLE ONCE.  . folic acid (FOLVITE) 1 MG tablet Take 1 mg by mouth daily.  Marland Kitchen KRILL OIL PO Take 1 tablet by mouth daily.  . methotrexate (RHEUMATREX) 2.5 MG tablet Take 7.5 mg by mouth once a week. Caution:Chemotherapy. Protect from light.  . Multiple Vitamins-Minerals (MULTIVITAMIN WITH MINERALS) tablet Take 1 tablet by mouth daily.  . Omega-3 Fatty Acids (FISH OIL PO) Take 1 tablet by mouth daily.  . Red Yeast Rice Extract (RED YEAST RICE PO) Take 1 tablet by mouth daily.  Marland Kitchen SYNTHROID 50 MCG tablet TAKE 50 MCG BY MOUTH DAILY.   No facility-administered encounter medications on file as of 11/29/2016.     Activities of Daily Living In your present state of health, do you have any difficulty performing the following activities: 11/29/2016  Hearing? Y  Comment bilateral hearing aids  Vision? Y  Comment cataracts in both eyes  Difficulty concentrating or making decisions? N  Walking or climbing stairs? N  Dressing or bathing? N  Doing errands, shopping? N  Preparing Food and eating ? N  Using the Toilet? N  In the past six months, have you accidently leaked urine? N  Do you have problems with loss of bowel control? N  Managing your Medications? N  Managing your Finances? N  Housekeeping or managing your Housekeeping? N  Some recent data might be hidden    Patient Care Team: Tonia Ghent, MD as PCP - General   Assessment:      Visual Acuity Screening   Right eye Left eye Both eyes  Without correction:     With correction: 20/30 20/50 20/30-1  Hearing Screening Comments: Bilateral hearing aids   Exercise Activities and Dietary recommendations Current Exercise Habits: The patient does not participate in regular exercise at present, Exercise limited by: None identified  Goals    . Increase water intake          Starting 11/29/2016, I will attempt to drink at least  6-8 glasses of water daily to prevent dehydration.       Fall Risk Fall Risk  11/29/2016 11/27/2015 10/02/2013 09/11/2012  Falls in the past year? No No No No   Depression Screen PHQ 2/9 Scores 11/29/2016 11/27/2015 10/02/2013 09/11/2012  PHQ - 2 Score 0 0 0 0  PHQ- 9 Score 0 - - -    Cognitive Function MMSE - Mini Mental State Exam 11/29/2016  Orientation to time 5  Orientation to Place 5  Registration 3  Attention/ Calculation 0  Recall 3  Language- name 2 objects 0  Language- repeat 1  Language- follow 3 step command 3  Language- read & follow direction 0  Write a sentence 0  Copy design 0  Total score 20     PLEASE NOTE: A Mini-Cog screen was completed. Maximum score is 20. A value of 0 denotes this part of [place] MMSE was not completed or the patient failed this part of the Mini-Cog screening.   Mini-Cog Screening Orientation to Time - Max 5 pts Orientation to Place - Max 5 pts Registration - Max 3 pts Recall - Max 3 pts Language Repeat - Max 1 pts Language Follow 3 Step Command - Max 3 pts     Immunization History  Administered Date(s) Administered  . Influenza Split 12/30/2010, 12/02/2011  . Influenza Whole 01/13/2010  . Influenza,inj,Quad PF,6+ Mos 01/08/2013, 01/16/2014, 12/26/2014, 11/27/2015  . Pneumococcal Conjugate-13 10/23/2014  . Pneumococcal Polysaccharide-23 10/18/2001  . Td 09/19/2002, 11/19/2013  . Zoster 03/22/2007   Screening Tests Health Maintenance  Topic Date Due  . INFLUENZA VACCINE  06/18/2017 (Originally 10/19/2016)  . TETANUS/TDAP  11/20/2023  . PNA vac Low Risk Adult  Completed      Plan:     I have personally reviewed and addressed the Medicare Annual Wellness questionnaire and have noted the following in the patient's chart:  A. Medical and social history B. Use of alcohol, tobacco or illicit drugs  C. Current medications and supplements D. Functional ability and status E.  Nutritional status F.  Physical activity G. Advance  directives H. List of other physicians I.  Hospitalizations, surgeries, and ER visits in previous 12 months J.  [place] to include hearing, vision, cognitive, depression L. Referrals and appointments - none  In addition, I have reviewed and discussed with patient certain preventive protocols, quality metrics, and best practice recommendations. A written personalized care plan for preventive services as well as general preventive health recommendations were provided to patient.  See attached scanned questionnaire for additional information.   Signed,   Lindell Noe, MHA, BS, LPN [facility]

## 2016-11-29 NOTE — Patient Instructions (Signed)
Larry [name] , Thank you for taking time to come for your Medicare Wellness Visit. I appreciate your ongoing commitment to your health goals. Please review the following plan we discussed and let me know if I can assist you in the future.   These are the goals we discussed: Goals    . Increase water intake          Starting 11/29/2016, I will attempt to drink at least 6-8 glasses of water daily to prevent dehydration.        This is a list of the screening recommended for you and due dates:  Health Maintenance  Topic Date Due  . Flu Shot  06/18/2017*  . Tetanus Vaccine  11/20/2023  . Pneumonia vaccines  Completed  *Topic was postponed. The date shown is not the original due date.   Preventive Care for Adults  A healthy lifestyle and preventive care can promote health and wellness. Preventive health guidelines for adults include the following key practices.  . A routine yearly physical is a good way to check with your health care provider about your health and preventive screening. It is a chance to share any concerns and updates on your health and to receive a thorough exam.  . Visit your dentist for a routine exam and preventive care every 6 months. Brush your teeth twice a day and floss once a day. Good oral hygiene prevents tooth decay and gum disease.  . The frequency of eye exams is based on your age, health, family medical history, use  of contact lenses, and other factors. Follow your health care provider's ecommendations for frequency of eye exams.  . Eat a healthy diet. Foods like vegetables, fruits, whole grains, low-fat dairy products, and lean protein foods contain the nutrients you need without too many calories. Decrease your intake of foods high in solid fats, added sugars, and salt. Eat the right amount of calories for you. Get information about a proper diet from your health care provider, if necessary.  . Regular physical exercise is one of the most important  things you can do for your health. Most adults should get at least 150 minutes of moderate-intensity exercise (any activity that increases your heart rate and causes you to sweat) each week. In addition, most adults need muscle-strengthening exercises on 2 or more days a week.  Silver Sneakers may be a benefit available to you. To determine eligibility, you may visit the website: www.silversneakers.com or contact program at [phone] Mon-Fri between 8AM-8PM.   . Maintain a healthy weight. The body mass index (BMI) is a screening tool to identify possible weight problems. It provides an estimate of body fat based on height and weight. Your health care provider can find your BMI and can help you achieve or maintain a healthy weight.   For adults 20 years and older: ? A BMI below 18.5 is considered underweight. ? A BMI of 18.5 to 24.9 is normal. ? A BMI of 25 to 29.9 is considered overweight. ? A BMI of 30 and above is considered obese.   . Maintain normal blood lipids and cholesterol levels by exercising and minimizing your intake of saturated fat. Eat a balanced diet with plenty of fruit and vegetables. Blood tests for lipids and cholesterol should begin at age 70 and be repeated every 5 years. If your lipid or cholesterol levels are high, you are over 50, or you are at high risk for heart disease, you may need your cholesterol levels checked  more frequently. Ongoing high lipid and cholesterol levels should be treated with medicines if diet and exercise are not working.  . If you smoke, find out from your health care provider how to quit. If you do not use tobacco, please do not start.  . If you choose to drink alcohol, please do not consume more than 2 drinks per day. One drink is considered to be 12 ounces (355 mL) of beer, 5 ounces (148 mL) of wine, or 1.5 ounces (44 mL) of liquor.  . If you are 12-72 years old, ask your health care provider if you should take aspirin to prevent  strokes.  . Use sunscreen. Apply sunscreen liberally and repeatedly throughout the day. You should seek shade when your shadow is shorter than you. Protect yourself by wearing long sleeves, pants, a wide-brimmed hat, and sunglasses year round, whenever you are outdoors.  . Once a month, do a whole body skin exam, using a mirror to look at the skin on your back. Tell your health care provider of new moles, moles that have irregular borders, moles that are larger than a pencil eraser, or moles that have changed in shape or color.

## 2016-12-02 LAB — ALLERGEN BEEF: BEEF IGE: 6.35 kU/L — AB

## 2016-12-02 LAB — ALLERGEN, PORK, F26: PORK IGE: 3.28 kU/L — AB

## 2016-12-02 LAB — ALPHA GAL IGE: ALPHA GAL IGE: 17.1 kU/L — AB (ref <0.35)

## 2016-12-05 ENCOUNTER — Ambulatory Visit (INDEPENDENT_AMBULATORY_CARE_PROVIDER_SITE_OTHER): Payer: Medicare Other | Admitting: Family Medicine

## 2016-12-05 ENCOUNTER — Encounter: Payer: Self-pay | Admitting: Family Medicine

## 2016-12-05 VITALS — BP 148/50 | HR 59 | Temp 97.9°F | Ht 64.0 in | Wt 153.0 lb

## 2016-12-05 DIAGNOSIS — E039 Hypothyroidism, unspecified: Secondary | ICD-10-CM | POA: Diagnosis not present

## 2016-12-05 DIAGNOSIS — Z91018 Allergy to other foods: Secondary | ICD-10-CM

## 2016-12-05 DIAGNOSIS — E785 Hyperlipidemia, unspecified: Secondary | ICD-10-CM

## 2016-12-05 DIAGNOSIS — Z91014 Allergy to mammalian meats: Secondary | ICD-10-CM

## 2016-12-05 DIAGNOSIS — Z7189 Other specified counseling: Secondary | ICD-10-CM

## 2016-12-05 DIAGNOSIS — Z0000 Encounter for general adult medical examination without abnormal findings: Secondary | ICD-10-CM

## 2016-12-05 MED ORDER — SYNTHROID 50 MCG PO TABS: ORAL_TABLET | ORAL | 3 refills | Status: DC

## 2016-12-05 MED ORDER — EPINEPHRINE 0.3 MG/0.3ML IJ SOAJ: INTRAMUSCULAR | 0 refills | Status: DC

## 2016-12-05 NOTE — Progress Notes (Signed)
Alpha gal labs d/w pt, elevated levels noted.  Hasn't had to use epi pen.  D/w pt.  He has abstained from mammalian meat.  No lip swelling, etc.  He can tolerate fish, chicken w/o troubles.    Hypothyroidism.  TSH wnl.  D/w pt.  No neck mass. No ADE on med.  No dysphagia.    HLD.  Statin intolerant.  Labs d/w pt.  Diet and exercise d/w pt.    Flu shot encouraged.  We don't have supply in clinic at this point.   Colon and prostate cancer screening deferred.   Advance directive- wife designated if patient were incapacitated.   He has a midday cough, with some sputum, but then it resolved.  He's had it for years/decades.  mucinex didn't help.  Would avoid longterm antihistamine use given his hx with bladder sx, d/w pt.   PMH and SH reviewed  ROS: Per HPI unless specifically indicated in ROS section   Meds, vitals, and allergies reviewed.   GEN: nad, alert and oriented HEENT: mucous membranes moist NECK: supple w/o LA, no tmg CV: rrr.  PULM: ctab, no inc wob ABD: soft, +bs EXT: no edema SKIN: no acute rash

## 2016-12-05 NOTE — Patient Instructions (Addendum)
Ask dermatology about the new shingles shot- shingrix.  Ask about taking the shot with the methotrexate.   Take care.  Glad to see you.  Update me as needed.  I would still avoid mammalian meat.

## 2016-12-06 NOTE — Assessment & Plan Note (Signed)
Flu shot encouraged.  We don't have supply in clinic at this point.   Colon and prostate cancer screening deferred.

## 2016-12-06 NOTE — Assessment & Plan Note (Signed)
Advance directive- wife designated if patient were incapacitated.  

## 2016-12-06 NOTE — Assessment & Plan Note (Signed)
tsh wnl, continue as is.  No tmg on exam.  Labs d/w pt. >25 minutes spent in face to face time with patient, >50% spent in counselling or coordination of care re: alpha gal, hypothyroidism, HLD, etc.

## 2016-12-06 NOTE — Assessment & Plan Note (Addendum)
Alpha gal labs d/w pt, elevated levels noted.  Hasn't had to use epi pen.  D/w pt.  He has abstained from mammalian meat, would continue to abstain.  No lip swelling, etc.  He can tolerate fish, chicken w/o troubles.

## 2016-12-06 NOTE — Assessment & Plan Note (Signed)
Statin intolerant, continue work on diet and exercise.  Labs d/w pt.

## 2017-01-04 ENCOUNTER — Ambulatory Visit (INDEPENDENT_AMBULATORY_CARE_PROVIDER_SITE_OTHER): Payer: PRIVATE HEALTH INSURANCE

## 2017-01-04 DIAGNOSIS — Z23 Encounter for immunization: Secondary | ICD-10-CM | POA: Diagnosis not present

## 2017-01-29 ENCOUNTER — Other Ambulatory Visit: Payer: Self-pay | Admitting: Family Medicine

## 2017-01-30 ENCOUNTER — Other Ambulatory Visit: Payer: Self-pay

## 2017-01-30 ENCOUNTER — Telehealth: Payer: Self-pay | Admitting: Family Medicine

## 2017-01-30 MED ORDER — SYNTHROID 50 MCG PO TABS: ORAL_TABLET | ORAL | 3 refills | Status: DC

## 2017-01-30 NOTE — Telephone Encounter (Signed)
Copied from [place] [phone]. Topic: Quick Communication - See Telephone Encounter >> Jan 30, 2017 12:41 PM Bea Graff, NT wrote: CRM for notification. See Telephone encounter for: Patient calling and stated he has lost the rx for his thyroid medicine and that if possible he would appreciate the rx being called into to [facility] on [address].   01/30/17.

## 2017-02-15 ENCOUNTER — Telehealth: Payer: Self-pay | Admitting: Family Medicine

## 2017-02-15 MED ORDER — DICLOFENAC SODIUM 1 % TD GEL: 2.0000 g | Freq: Four times a day (QID) | TRANSDERMAL | 2 refills | Status: DC | PRN

## 2017-02-15 NOTE — Telephone Encounter (Signed)
Sent. Thanks.   

## 2017-02-15 NOTE — Telephone Encounter (Signed)
Copied from [place] [phone]. Topic: Quick Communication - See Telephone Encounter >> Feb 15, 2017  8:41 AM Ether Griffins B wrote: CRM for notification. See Telephone encounter for:  Pt wanting diclofenac sodium called in  02/15/17.

## 2017-02-17 NOTE — Telephone Encounter (Signed)
Patient calling and states that the insurance company needs to know why the dr is prescribing this medication for him before they will cover it. During the phone conversation with the patient he made it seem like this was going to be for his wife and not him. I looked in her chart and this has not been called in for her. Patient states he usually gets it called in for him but they both use the medication. Please advise.

## 2017-02-17 NOTE — Telephone Encounter (Signed)
PA will be done when I can but please take notice to the note below concerning both patients taking this medication.

## 2017-02-19 NOTE — Telephone Encounter (Signed)
Proceed with the PA but she needs a separate rx if she is going to use the med.  Thanks.

## 2017-02-21 NOTE — Telephone Encounter (Signed)
Received paperwork from [place] for PA to be completed and faxed back. Paperwork is on your desk.

## 2017-02-22 NOTE — Telephone Encounter (Signed)
I'll work on the hard copy.  Thanks.  

## 2017-02-28 ENCOUNTER — Telehealth: Payer: Self-pay | Admitting: Family Medicine

## 2017-02-28 NOTE — Telephone Encounter (Signed)
Copied from [place] [phone]. Topic: Quick Communication - Rx Refill/Question >> Feb 28, 2017  9:26 AM [name], [name] D wrote: Has the patient contacted their pharmacy?Yes (Agent: If no, request that the patient contact the pharmacy for the refill.) Preferred Pharmacy (with phone number or street name): [id number] [place], [place]: Please be advised that RX refills may take up to 3 business days. We ask that you follow-up with your pharmacy.  Pharmacy called and said that they need prior authorization for patients medication diclofenac sodium (VOLTAREN) 1 % GEL. Insurance won't pay for it until authorization is sent in.

## 2017-03-01 ENCOUNTER — Encounter: Payer: Self-pay | Admitting: Family Medicine

## 2017-03-01 DIAGNOSIS — M2550 Pain in unspecified joint: Secondary | ICD-10-CM | POA: Insufficient documentation

## 2017-03-01 NOTE — Telephone Encounter (Signed)
Asking for prior authorization. Thanks.

## 2017-03-02 ENCOUNTER — Telehealth: Payer: Self-pay

## 2017-03-02 NOTE — Telephone Encounter (Signed)
PA was faxed this morning.

## 2017-03-02 NOTE — Telephone Encounter (Signed)
Hard copy faxed to [facility].

## 2017-03-02 NOTE — Telephone Encounter (Signed)
Hard copy faxed to [place].

## 2017-03-02 NOTE — Telephone Encounter (Signed)
Copied from [place] [phone]. Topic: Quick Communication - Rx Refill/Question >> Feb 28, 2017  9:26 AM [name], [name] D wrote: Has the patient contacted their pharmacy?Yes (Agent: If no, request that the patient contact the pharmacy for the refill.) Preferred Pharmacy (with phone number or street name): [id number] [place], [place]: Please be advised that RX refills may take up to 3 business days. We ask that you follow-up with your pharmacy.  Pharmacy called and said that they need prior authorization for patients medication diclofenac sodium (VOLTAREN) 1 % GEL. Insurance won't pay for it until authorization is sent in. >> Mar 02, 2017  2:42 PM [name] wrote: Larry [name] calling stating that they was going to fax over prior authorization questions for the RX for Voltaren 1% today

## 2017-03-03 NOTE — Telephone Encounter (Addendum)
Notice received asking for additional information.  Additional info faxed.

## 2017-03-06 NOTE — Telephone Encounter (Signed)
Received approval letter, faxed to pharmacy and scanned.

## 2017-04-12 IMAGING — CT CT HEAD W/O CM
1 series · 16 of 30 positions shown, 20 images · non-contrast
Comparison: None.

CLINICAL DATA: Syncope and headache on the right. No history of
trauma

EXAM:
CT HEAD WITHOUT CONTRAST
TECHNIQUE: Contiguous axial images were obtained from the base of the skull
through the vertex without intravenous contrast.

[Series 2: head 5.0 h30s · axial · 0.44mm/px · z∈[+1040,+1175]mm · 16 of 30 slices shown, 20 images]
[im 2/30  brain]
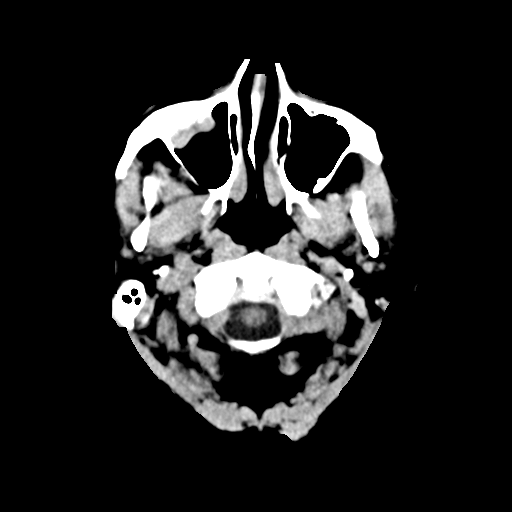
[im 2/30  bone]
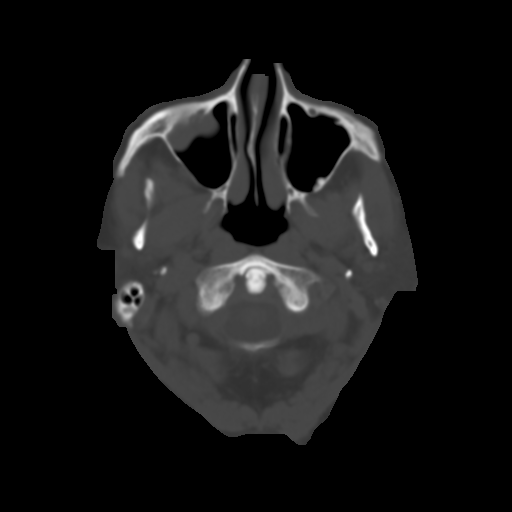
[im 4/30  brain]
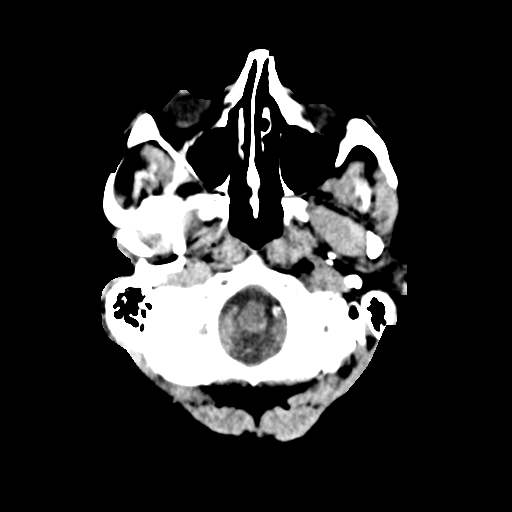
[im 6/30  brain]
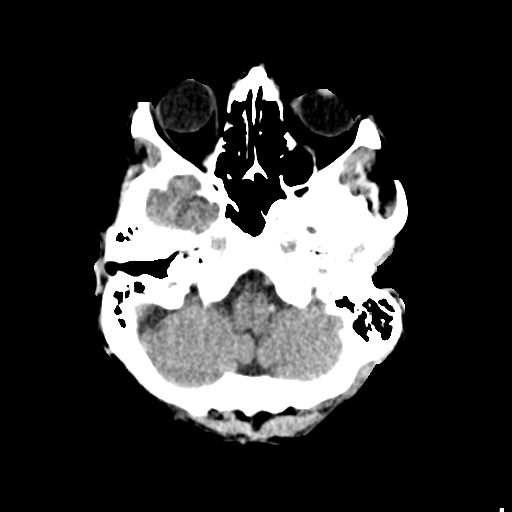
[im 8/30  brain]
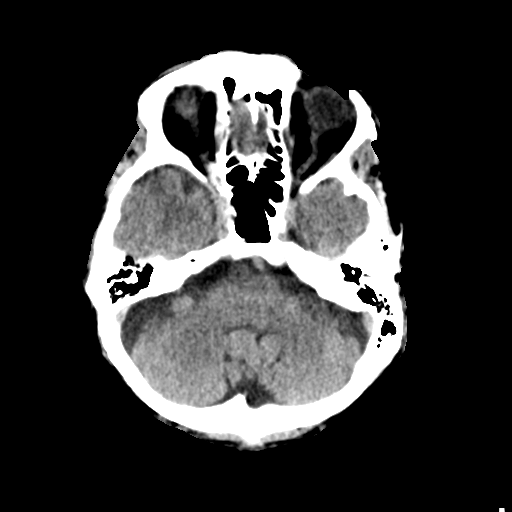
[im 9/30  brain]
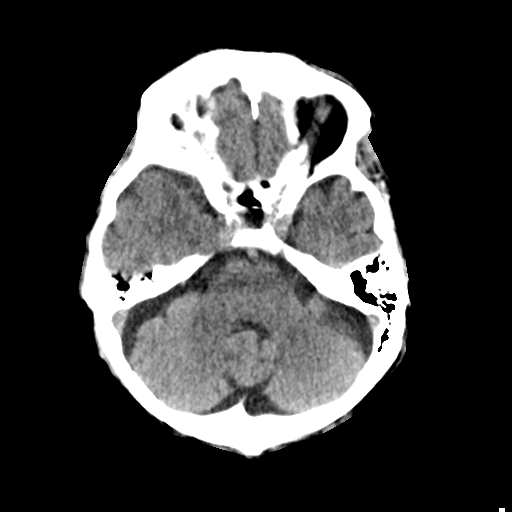
[im 9/30  bone]
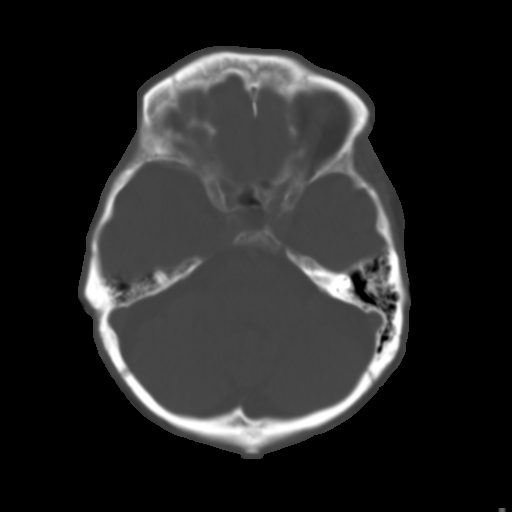
[im 11/30  brain]
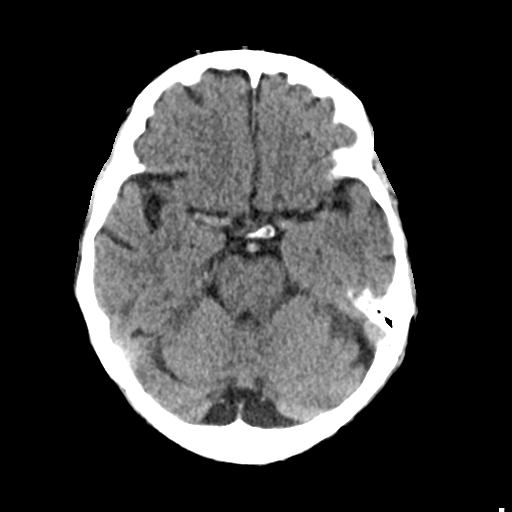
[im 13/30  brain]
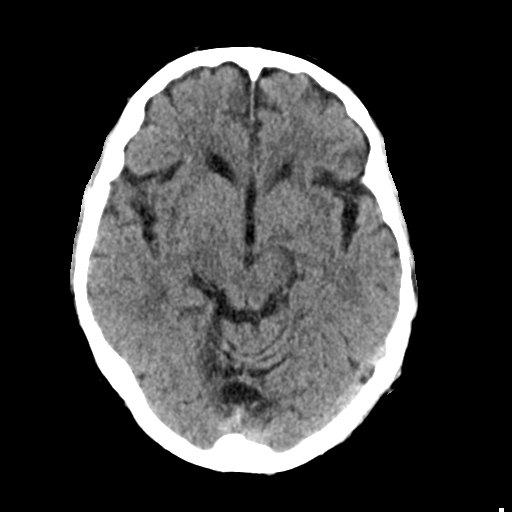
[im 15/30  brain]
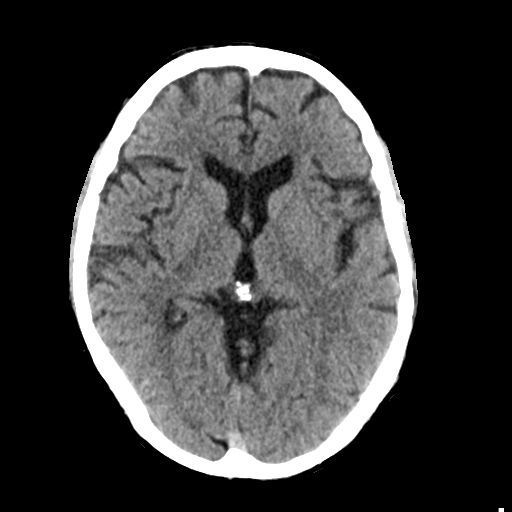
[im 16/30  brain]
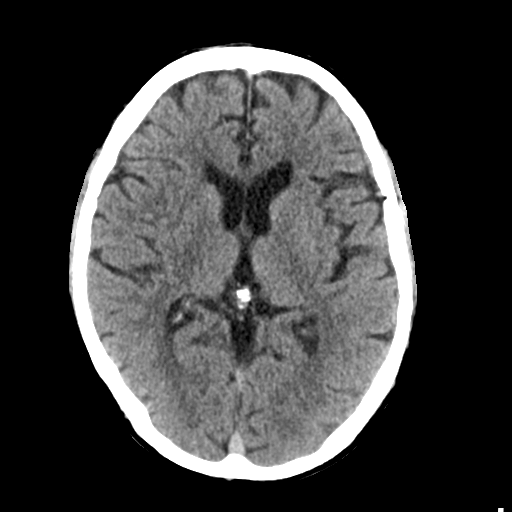
[im 16/30  bone]
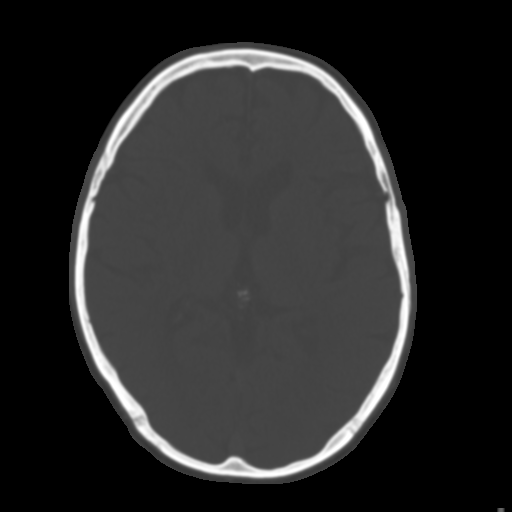
[im 18/30  brain]
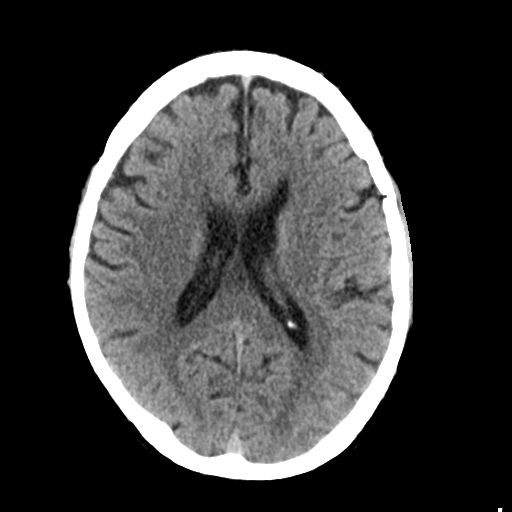
[im 20/30  brain]
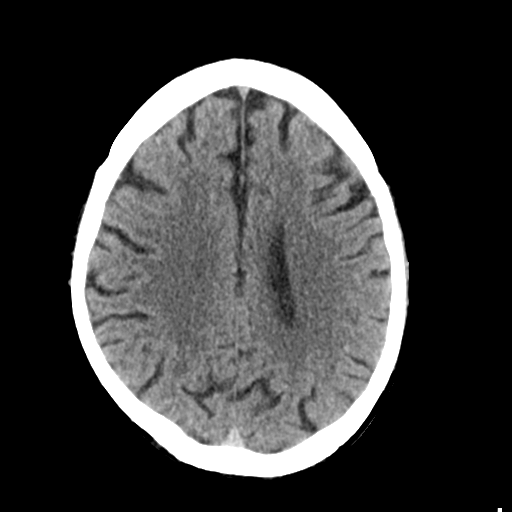
[im 22/30  brain]
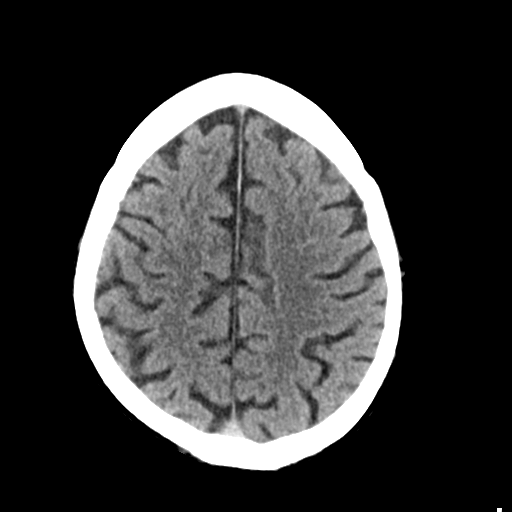
[im 23/30  brain]
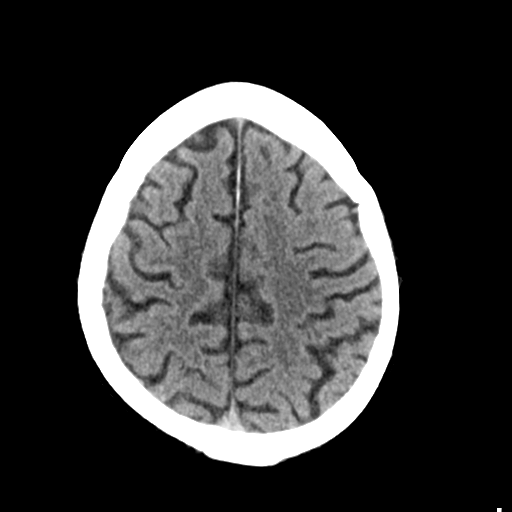
[im 23/30  bone]
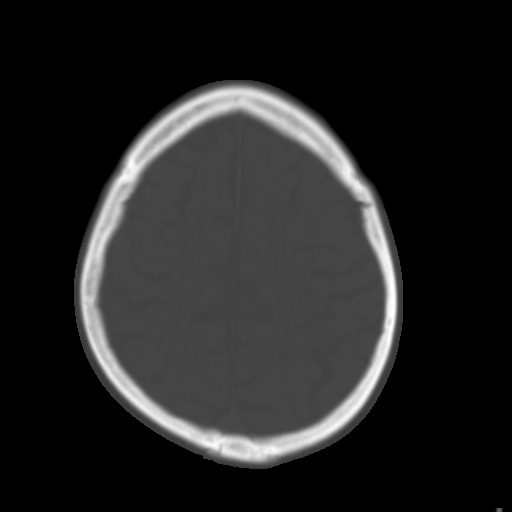
[im 25/30  brain]
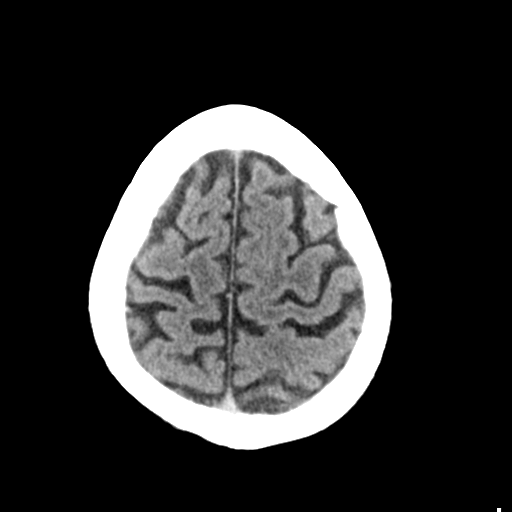
[im 27/30  brain]
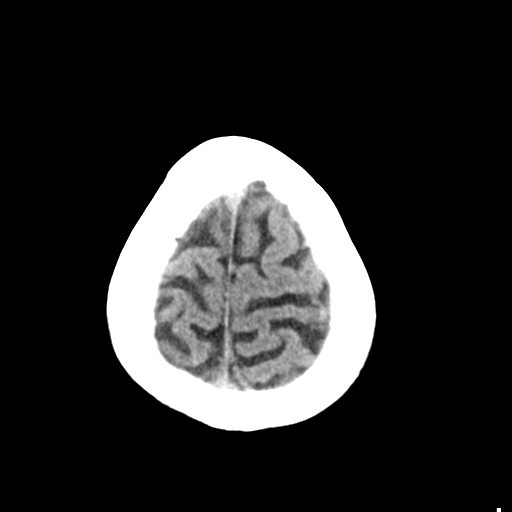
[im 29/30  brain]
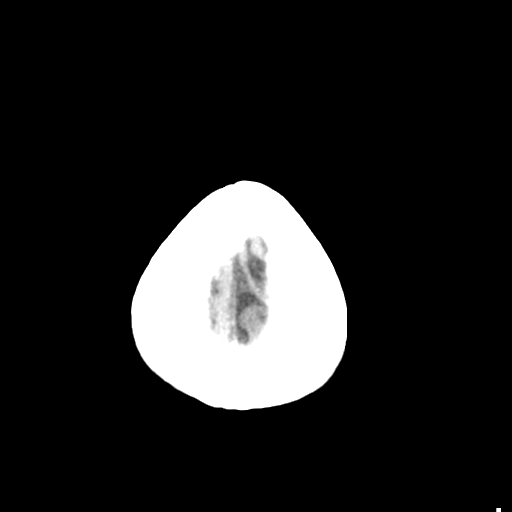

[16 of 30 positions shown; findings below may reference images not displayed]

FINDINGS: There is mild diffuse atrophy. Prominence of the cisterna magna is
an anatomic variant. There is no intracranial mass, hemorrhage,
extra-axial fluid collection, or midline shift. The gray-white
compartments are normal. There is no demonstrable acute infarct. The
bony calvarium appears intact. The mastoid air cells are clear.
There is a lobulated retention cyst in the anterior inferior right
maxillary antrum.
IMPRESSION: Mild diffuse atrophy. No intracranial mass, hemorrhage, or focal
gray - white compartment lesions/acute appearing infarct. Lobulated
retention cyst anterior inferior right maxillary antrum.

## 2017-07-13 ENCOUNTER — Telehealth: Payer: Self-pay | Admitting: Family Medicine

## 2017-07-13 NOTE — Telephone Encounter (Signed)
Copied from [place]. Topic: Quick Communication - See Telephone Encounter >> Jul 13, 2017  1:31 PM Rutherford Nail, [place] wrote: CRM for notification. See Telephone encounter for: 07/13/17. Patient's wife calling to see if Dr Damita Dunnings does measles vaccinations? Please advise and give a call back. CB#: [phone]

## 2017-07-13 NOTE — Telephone Encounter (Signed)
Patient advised.

## 2017-07-13 NOTE — Telephone Encounter (Signed)
I wouldn't get MMR with ongoing methotrexate use.   If he had the vaccine or the disease earlier in life, the he wouldn't need the vaccine at this point.  People born in his decade had presumed exposure early in life.   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  As it stands now, it doesn't appear that he needs repeat vaccination.   Thanks.

## 2017-12-10 ENCOUNTER — Other Ambulatory Visit: Payer: Self-pay | Admitting: Family Medicine

## 2017-12-10 DIAGNOSIS — D696 Thrombocytopenia, unspecified: Secondary | ICD-10-CM

## 2017-12-10 DIAGNOSIS — E785 Hyperlipidemia, unspecified: Secondary | ICD-10-CM

## 2017-12-10 DIAGNOSIS — E039 Hypothyroidism, unspecified: Secondary | ICD-10-CM

## 2017-12-14 ENCOUNTER — Ambulatory Visit (INDEPENDENT_AMBULATORY_CARE_PROVIDER_SITE_OTHER): Payer: Medicare Other

## 2017-12-14 ENCOUNTER — Ambulatory Visit: Payer: PRIVATE HEALTH INSURANCE

## 2017-12-14 VITALS — BP 140/80 | HR 68 | Temp 97.7°F | Ht 64.75 in | Wt 154.2 lb

## 2017-12-14 DIAGNOSIS — Z0000 Encounter for general adult medical examination without abnormal findings: Secondary | ICD-10-CM

## 2017-12-14 DIAGNOSIS — D696 Thrombocytopenia, unspecified: Secondary | ICD-10-CM

## 2017-12-14 DIAGNOSIS — E785 Hyperlipidemia, unspecified: Secondary | ICD-10-CM | POA: Diagnosis not present

## 2017-12-14 DIAGNOSIS — E039 Hypothyroidism, unspecified: Secondary | ICD-10-CM

## 2017-12-14 LAB — LIPID PANEL
CHOL/HDL RATIO: 5
Cholesterol: 185 mg/dL (ref 0–200)
HDL: 40 mg/dL (ref >39.00)
NONHDL: 144.57
Triglycerides: 243 mg/dL — ABNORMAL HIGH (ref 0.0–149.0)
VLDL: 48.6 mg/dL — AB (ref 0.0–40.0)

## 2017-12-14 LAB — CBC WITH DIFFERENTIAL/PLATELET
BASOS PCT: 1 % (ref 0.0–3.0)
Basophils Absolute: 0 10*3/uL (ref 0.0–0.1)
EOS ABS: 0.2 10*3/uL (ref 0.0–0.7)
EOS PCT: 5.2 % — AB (ref 0.0–5.0)
HCT: 43.8 % (ref 39.0–52.0)
HEMOGLOBIN: 15.4 g/dL (ref 13.0–17.0)
Lymphocytes Relative: 30.7 % (ref 12.0–46.0)
Lymphs Abs: 1.2 10*3/uL (ref 0.7–4.0)
MCHC: 35.1 g/dL (ref 30.0–36.0)
MCV: 94.2 fl (ref 78.0–100.0)
MONO ABS: 0.3 10*3/uL (ref 0.1–1.0)
Monocytes Relative: 6.7 % (ref 3.0–12.0)
NEUTROS ABS: 2.3 10*3/uL (ref 1.4–7.7)
Neutrophils Relative %: 56.4 % (ref 43.0–77.0)
PLATELETS: 116 10*3/uL — AB (ref 150.0–400.0)
RBC: 4.66 Mil/uL (ref 4.22–5.81)
RDW: 14.2 % (ref 11.5–15.5)
WBC: 4 10*3/uL (ref 4.0–10.5)

## 2017-12-14 LAB — COMPREHENSIVE METABOLIC PANEL
ALT: 17 U/L (ref 0–53)
AST: 18 U/L (ref 0–37)
Albumin: 4.2 g/dL (ref 3.5–5.2)
Alkaline Phosphatase: 53 U/L (ref 39–117)
BUN: 16 mg/dL (ref 6–23)
CHLORIDE: 104 meq/L (ref 96–112)
CO2: 28 meq/L (ref 19–32)
CREATININE: 0.88 mg/dL (ref 0.40–1.50)
Calcium: 8.7 mg/dL (ref 8.4–10.5)
GFR: 87.21 mL/min (ref >60.00)
Glucose, Bld: 111 mg/dL — ABNORMAL HIGH (ref 70–99)
POTASSIUM: 4.1 meq/L (ref 3.5–5.1)
SODIUM: 138 meq/L (ref 135–145)
Total Bilirubin: 0.6 mg/dL (ref 0.2–1.2)
Total Protein: 6.6 g/dL (ref 6.0–8.3)

## 2017-12-14 LAB — TSH: TSH: 2.6 u[IU]/mL (ref 0.35–4.50)

## 2017-12-14 LAB — LDL CHOLESTEROL, DIRECT: LDL DIRECT: 87 mg/dL

## 2017-12-14 NOTE — Patient Instructions (Addendum)
Mr. Mogle , Thank you for taking time to come for your Medicare Wellness Visit. I appreciate your ongoing commitment to your health goals. Please review the following plan we discussed and let me know if I can assist you in the future.   These are the goals we discussed: Goals    . Increase water intake     Starting 12/14/2017, I will attempt to drink at least 6-8 glasses of water daily to prevent dehydration.        This is a list of the screening recommended for you and due dates:  Health Maintenance  Topic Date Due  . Flu Shot  06/19/2018*  . Tetanus Vaccine  11/20/2023  . Pneumonia vaccines  Completed  *Topic was postponed. The date shown is not the original due date.    Preventive Care for Adults  A healthy lifestyle and preventive care can promote health and wellness. Preventive health guidelines for adults include the following key practices.  . A routine yearly physical is a good way to check with your health care provider about your health and preventive screening. It is a chance to share any concerns and updates on your health and to receive a thorough exam.  . Visit your dentist for a routine exam and preventive care every 6 months. Brush your teeth twice a day and floss once a day. Good oral hygiene prevents tooth decay and gum disease.  . The frequency of eye exams is based on your age, health, family medical history, use  of contact lenses, and other factors. Follow your health care provider's recommendations for frequency of eye exams.  . Eat a healthy diet. Foods like vegetables, fruits, whole grains, low-fat dairy products, and lean protein foods contain the nutrients you need without too many calories. Decrease your intake of foods high in solid fats, added sugars, and salt. Eat the right amount of calories for you. Get information about a proper diet from your health care provider, if necessary.  . Regular physical exercise is one of the most important things  you can do for your health. Most adults should get at least 150 minutes of moderate-intensity exercise (any activity that increases your heart rate and causes you to sweat) each week. In addition, most adults need muscle-strengthening exercises on 2 or more days a week.  Silver Sneakers may be a benefit available to you. To determine eligibility, you may visit the website: www.silversneakers.com or contact program at [phone] Mon-Fri between 8AM-8PM.   . Maintain a healthy weight. The body mass index (BMI) is a screening tool to identify possible weight problems. It provides an estimate of body fat based on height and weight. Your health care provider can find your BMI and can help you achieve or maintain a healthy weight.   For adults 20 years and older: ? A BMI below 18.5 is considered underweight. ? A BMI of 18.5 to 24.9 is normal. ? A BMI of 25 to 29.9 is considered overweight. ? A BMI of 30 and above is considered obese.   . Maintain normal blood lipids and cholesterol levels by exercising and minimizing your intake of saturated fat. Eat a balanced diet with plenty of fruit and vegetables. Blood tests for lipids and cholesterol should begin at age 70 and be repeated every 5 years. If your lipid or cholesterol levels are high, you are over 50, or you are at high risk for heart disease, you may need your cholesterol levels checked more frequently. Ongoing high  lipid and cholesterol levels should be treated with medicines if diet and exercise are not working.  . If you smoke, find out from your health care provider how to quit. If you do not use tobacco, please do not start.  . If you choose to drink alcohol, please do not consume more than 2 drinks per day. One drink is considered to be 12 ounces (355 mL) of beer, 5 ounces (148 mL) of wine, or 1.5 ounces (44 mL) of liquor.  . If you are 60-42 years old, ask your health care provider if you should take aspirin to prevent strokes.  . Use  sunscreen. Apply sunscreen liberally and repeatedly throughout the day. You should seek shade when your shadow is shorter than you. Protect yourself by wearing long sleeves, pants, a wide-brimmed hat, and sunglasses year round, whenever you are outdoors.  . Once a month, do a whole body skin exam, using a mirror to look at the skin on your back. Tell your health care provider of new moles, moles that have irregular borders, moles that are larger than a pencil eraser, or moles that have changed in shape or color.

## 2017-12-14 NOTE — Progress Notes (Signed)
PCP notes:   Health maintenance:  Flu vaccine - addressed  Abnormal screenings:   None  Patient concerns:   None  Nurse concerns:  None  Next PCP appt:   12/18/17 @ 0945

## 2017-12-14 NOTE — Progress Notes (Signed)
I reviewed health advisor's note, was available for consultation, and agree with documentation and plan.   Signed,  Rosslyn Pasion T. Irasema Chalk, MD  

## 2017-12-14 NOTE — Progress Notes (Signed)
Subjective:   Larry [name] is a 84 y.o. male who presents for Medicare Annual/Subsequent preventive examination.  Review of Systems:  N/A Cardiac Risk Factors include: advanced age (>70men, >34 women);male gender;dyslipidemia     Objective:    Vitals: BP 140/80 (BP Location: Right Arm, Patient Position: Sitting, Cuff Size: Normal)   Pulse 68   Temp 97.7 F (36.5 C) (Oral)   Ht 5' 4.75" (1.645 m) Comment: shoes  Wt 154 lb 4 oz (70 kg)   SpO2 97%   BMI 25.87 kg/m   Body mass index is 25.87 kg/m.  Advanced Directives 12/14/2017 11/29/2016 12/05/2015 11/04/2015 10/08/2014 03/07/2013  Does Patient Have a Medical Advance Directive? Yes Yes No Yes Yes Patient has advance directive, copy not in chart  Type of Advance Directive [place];Living will [place];Living will - [place];Living will Living will;Healthcare Power of [place];Living will  Does patient want to make changes to medical advance directive? No - Patient declined - - No - Patient declined - No  Copy of Healthcare Power of Attorney in Chart? Yes No - copy requested - No - copy requested Yes Copy requested from family  Would patient like information on creating a medical advance directive? - - No - patient declined information - - -  Pre-existing out of facility DNR order (yellow form or pink MOST form) - - - - - No    Tobacco Social History   Tobacco Use  Smoking Status Never Smoker  Smokeless Tobacco Never Used  Tobacco Comment   smoked only as a teenage small amount     Counseling given: No Comment: smoked only as a teenage small amount   Clinical Intake:  Pre-visit preparation completed: Yes  Pain : No/denies pain Pain Score: 0-No pain     Nutritional Status: BMI 25 -29 Overweight Nutritional Risks: None Diabetes: No  How often do you need to have someone help you when you read instructions, pamphlets, or other  written materials from your doctor or pharmacy?: 1 - Never What is the last grade level you completed in school?: 12th grade   Interpreter Needed?: No  Comments: pt lives with spouse Information entered by :: LPinson, LPN  Past Medical History:  Diagnosis Date  . 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 Esophageal reflux   . Family history of diabetes mellitus   . History of chickenpox   . Hypertriglyceridemia   . Other and unspecified hyperlipidemia   . Pre-syncope   . Thrombocytopenia, unspecified ([place])   . Unspecified hypothyroidism    Past Surgical History:  Procedure Laterality Date  . APPENDECTOMY  1949   Family History  Problem Relation Age of Onset  . Stroke Mother   . Diabetes Mother        borderline  . Pneumonia Father   . Hypertension Sister   . Hyperlipidemia Sister   . Alzheimer's disease Brother   . Dementia Brother   . Liver disease Sister        cirrhosis, nondrinker  . Arthritis Other   . Diabetes Other        1st degree relative  . Hyperlipidemia Other   . Hypertension Other   . Prostate cancer Neg Hx   . Colon cancer Neg Hx    Social History   Socioeconomic History  . Marital status: Married    Spouse name: Not on file  . Number of children:  4  . Years of education: Not on file  . Highest education level: Not on file  Occupational History  . Occupation: Retired from [employer]: RETIRED  Social Needs  . Financial resource strain: Not on file  . Food insecurity:    Worry: Not on file    Inability: Not on file  . Transportation needs:    Medical: Not on file    Non-medical: Not on file  Tobacco Use  . Smoking status: Never Smoker  . Smokeless tobacco: Never Used  . Tobacco comment: smoked only as a teenage small amount  Substance and Sexual Activity  . Alcohol use: No    Frequency: Never  . Drug use: No  . Sexual activity: Not on file  Lifestyle  . Physical activity:    Days per week: Not on file    Minutes  per session: Not on file  . Stress: Not on file  Relationships  . Social connections:    Talks on phone: Not on file    Gets together: Not on file    Attends religious service: Not on file    Active member of club or organization: Not on file    Attends meetings of clubs or organizations: Not on file    Relationship status: Not on file  Other Topics Concern  . Not on file  Social History Narrative   Caffeine:  Coffee, tea, 2+ daily.   Diet:  Fish, chicken, vegetables   Regular exercise:  Yes, yard work, gardening   Married 1953   4 kids    Outpatient Encounter Medications as of 12/14/2017  Medication Sig  . ALPRAZolam (XANAX) 0.25 MG tablet TAKE 1/2 TABLET BY MOUTH TWICE DAILY AS NEEDED FOR ANXIETY  . aspirin 81 MG tablet Take 81 mg by mouth daily.  . calcium carbonate (TUMS - DOSED IN MG ELEMENTAL CALCIUM) 500 MG chewable tablet Chew 1 tablet by mouth daily as needed for indigestion or heartburn.  . Coenzyme Q10 (CO Q 10 PO) Take 1 capsule by mouth daily.  . diclofenac sodium (VOLTAREN) 1 % GEL Apply 2 g topically 4 (four) times daily as needed.  Marland Kitchen EPINEPHrine (EPIPEN 2-PAK) 0.3 mg/0.3 mL IJ SOAJ injection INJECT 0.3 MLS (0.3 MG TOTAL) INTO THE MUSCLE ONCE.  . folic acid (FOLVITE) 1 MG tablet Take 1 mg by mouth daily.  Marland Kitchen KRILL OIL PO Take 1 tablet by mouth daily.  . methotrexate (RHEUMATREX) 2.5 MG tablet Take 7.5 mg by mouth once a week. Caution:Chemotherapy. Protect from light.  . Multiple Vitamins-Minerals (MULTIVITAMIN WITH MINERALS) tablet Take 1 tablet by mouth daily.  . Omega-3 Fatty Acids (FISH OIL PO) Take 1 tablet by mouth daily.  . Red Yeast Rice Extract (RED YEAST RICE PO) Take 1 tablet by mouth daily.  Marland Kitchen SYNTHROID 50 MCG tablet TAKE 50 MCG BY MOUTH DAILY.   No facility-administered encounter medications on file as of 12/14/2017.     Activities of Daily Living In your present state of health, do you have any difficulty performing the following activities: 12/14/2017    Hearing? Y  Vision? N  Difficulty concentrating or making decisions? N  Walking or climbing stairs? N  Dressing or bathing? N  Doing errands, shopping? N  Preparing Food and eating ? N  Using the Toilet? N  In the past six months, have you accidently leaked urine? N  Do you have problems with loss of bowel control? N  Managing your Medications? N  Managing your Finances? N  Housekeeping or managing your Housekeeping? N  Some recent data might be hidden    Patient Care Team: Tonia Ghent, MD as PCP - Sandford Craze, MD as Consulting Physician (Dermatology) Mosetta Anis, MD as Referring Physician (Allergy)   Assessment:   This is a routine wellness examination for Monish.  Hearing Screening Comments: Bilateral hearing aids Vision Screening Comments: 2019 Bilateral cataract surgery with Dr. Tommy Rainwater; regular optometrist is Dr. Marvel Plan  Exercise Activities and Dietary recommendations Current Exercise Habits: The patient does not participate in regular exercise at present(yard work), Exercise limited by: None identified  Goals    . Increase water intake     Starting 12/14/2017, I will attempt to drink at least 6-8 glasses of water daily to prevent dehydration.        Fall Risk Fall Risk  12/14/2017 12/05/2016 11/29/2016 11/27/2015 10/02/2013  Falls in the past year? No No No No No   Depression Screen PHQ 2/9 Scores 12/14/2017 12/05/2016 11/29/2016 11/27/2015  PHQ - 2 Score 0 0 0 0  PHQ- 9 Score 0 - 0 -    Cognitive Function MMSE - Mini Mental State Exam 12/14/2017 11/29/2016  Orientation to time 5 5  Orientation to Place 5 5  Registration 3 3  Attention/ Calculation 0 0  Recall 3 3  Language- name 2 objects 0 0  Language- repeat 1 1  Language- follow 3 step command 3 3  Language- read & follow direction 0 0  Write a sentence 0 0  Copy design 0 0  Total score 20 20     PLEASE NOTE: A Mini-Cog screen was completed. Maximum score is 20. A value of 0 denotes this  part of [place] MMSE was not completed or the patient failed this part of the Mini-Cog screening.   Mini-Cog Screening Orientation to Time - Max 5 pts Orientation to Place - Max 5 pts Registration - Max 3 pts Recall - Max 3 pts Language Repeat - Max 1 pts Language Follow 3 Step Command - Max 3 pts     Immunization History  Administered Date(s) Administered  . Influenza Split 12/30/2010, 12/02/2011  . Influenza Whole 01/13/2010  . Influenza,inj,Quad PF,6+ Mos 01/08/2013, 01/16/2014, 12/26/2014, 11/27/2015, 01/04/2017  . Pneumococcal Conjugate-13 10/23/2014  . Pneumococcal Polysaccharide-23 10/18/2001  . Td 09/19/2002, 11/19/2013  . Zoster 03/22/2007    Screening Tests Health Maintenance  Topic Date Due  . INFLUENZA VACCINE  06/19/2018 (Originally 10/19/2017)  . TETANUS/TDAP  11/20/2023  . PNA vac Low Risk Adult  Completed     Plan:   I have personally reviewed, addressed, and noted the following in the patient's chart:  A. Medical and social history B. Use of alcohol, tobacco or illicit drugs  C. Current medications and supplements D. Functional ability and status E.  Nutritional status F.  Physical activity G. Advance directives H. List of other physicians I.  Hospitalizations, surgeries, and ER visits in previous 12 months J.  [place] to include hearing, vision, cognitive, depression L. Referrals and appointments - none  In addition, I have reviewed and discussed with patient certain preventive protocols, quality metrics, and best practice recommendations. A written personalized care plan for preventive services as well as general preventive health recommendations were provided to patient.  See attached scanned questionnaire for additional information.   Signed,   Lindell Noe, MHA, BS, LPN Health Coach

## 2017-12-18 ENCOUNTER — Ambulatory Visit (INDEPENDENT_AMBULATORY_CARE_PROVIDER_SITE_OTHER): Payer: Medicare Other | Admitting: Family Medicine

## 2017-12-18 ENCOUNTER — Encounter: Payer: Self-pay | Admitting: Family Medicine

## 2017-12-18 VITALS — BP 140/80 | HR 68 | Temp 97.7°F | Ht 64.75 in | Wt 154.2 lb

## 2017-12-18 DIAGNOSIS — E785 Hyperlipidemia, unspecified: Secondary | ICD-10-CM

## 2017-12-18 DIAGNOSIS — F418 Other specified anxiety disorders: Secondary | ICD-10-CM | POA: Diagnosis not present

## 2017-12-18 DIAGNOSIS — Z91018 Allergy to other foods: Secondary | ICD-10-CM | POA: Insufficient documentation

## 2017-12-18 DIAGNOSIS — D696 Thrombocytopenia, unspecified: Secondary | ICD-10-CM

## 2017-12-18 DIAGNOSIS — Z7189 Other specified counseling: Secondary | ICD-10-CM

## 2017-12-18 DIAGNOSIS — L309 Dermatitis, unspecified: Secondary | ICD-10-CM | POA: Diagnosis not present

## 2017-12-18 DIAGNOSIS — E039 Hypothyroidism, unspecified: Secondary | ICD-10-CM

## 2017-12-18 DIAGNOSIS — Z0000 Encounter for general adult medical examination without abnormal findings: Secondary | ICD-10-CM

## 2017-12-18 MED ORDER — METHOTREXATE 2.5 MG PO TABS: 5.0000 mg | ORAL_TABLET | ORAL | Status: DC

## 2017-12-18 MED ORDER — SYNTHROID 50 MCG PO TABS: ORAL_TABLET | ORAL | 3 refills | Status: DC

## 2017-12-18 MED ORDER — ALPRAZOLAM 0.25 MG PO TABS: ORAL_TABLET | ORAL | 1 refills | Status: DC

## 2017-12-18 NOTE — Assessment & Plan Note (Signed)
  Tetanus 2015 PNA UTD Shingles 2009 Flu shot encouraged.  He'll get next month.  PSA and colon cancer screening deferred given age.   Advance directive- wife designated if patient were incapacitated.

## 2017-12-18 NOTE — Patient Instructions (Signed)
Don't change your meds for now.  Update me as needed.  I would get a flu shot each fall.   Take care.  Glad to see you.  Don't change your meds for now.

## 2017-12-18 NOTE — Assessment & Plan Note (Signed)
Continue red yeast rice.  Labs discussed with patient.  Work on diet and exercise.  He agrees.  Triglycerides are improved compared to previous. >25 minutes spent in face to face time with patient, >50% spent in counselling or coordination of care, discussing alpha gal, hyperlipidemia, rash, thrombocytopenia, anxiety, hypothyroidism, etc.

## 2017-12-18 NOTE — Assessment & Plan Note (Signed)
Long-standing.  No bleeding.  Discussed with patient.  Continue observation.

## 2017-12-18 NOTE — Assessment & Plan Note (Signed)
Routine cautions discussed with patient.  He had episode where [place] sausage was packed and a pork casing.  He is vigilant otherwise.  No symptoms currently.  Update me as needed.  He agrees.

## 2017-12-18 NOTE — Progress Notes (Signed)
H/o alpha gal.  He avoids mammalian meat.  Cautions d/w pt.  He had [place] sausage with pork casing (unknown to him at the time) that led him to having to take benadryl.  Sx resolved in the meantime.  D/w pt about cautions.  No EpiPen use.  He has EpiPen to use.  Still on MTX per dermatology for itching.  He has seen dermatology in the meantime, last week.  I'll defer, d/w pt.   Elevated Cholesterol: Using medications without problems:yes Muscle aches: no Diet compliance:yes Exercise: yes  Low platelets d/w pt.  CBC at baseline.  No bleeding.  Long-standing issue for patient.  Anxiety.  Overall rare use of benzodiazepine.  Some occ use with inc in itching but he is trying to limit use, with 1/2 tab BZD at night.    Hypothyroidism.  Compliant.  No ADE.  Labs d/w pt.  No neck mass.    We talked about ASA use and he could stop.  D/w pt.  Med list updated.   Tetanus 2015 PNA UTD Shingles 2009 Flu shot encouraged.  He'll get next month.  PSA and colon cancer screening deferred given age.   Advance directive- wife designated if patient were incapacitated.   PMH and SH reviewed Meds, vitals, and allergies reviewed.   ROS: Per HPI unless specifically indicated in ROS section   GEN: nad, alert and oriented HEENT: mucous membranes moist NECK: supple w/o LA, no stridor.  No lip or tongue swelling. CV: rrr. PULM: ctab, no inc wob ABD: soft, +bs EXT: no edema SKIN: He has a blanching erythematous patch on the right upper chest.  He has had a similar flare previously.

## 2017-12-18 NOTE — Assessment & Plan Note (Signed)
Advance directive- wife designated if patient were incapacitated.  

## 2017-12-18 NOTE — Assessment & Plan Note (Signed)
His benzodiazepine as needed.  No adverse effect on medication.  He agrees.

## 2017-12-18 NOTE — Assessment & Plan Note (Signed)
No neck mass.  TSH normal.  Continue as is.  He agrees.

## 2017-12-18 NOTE — Assessment & Plan Note (Signed)
Per dermatology.  I will defer.  He agrees.

## 2018-01-01 ENCOUNTER — Other Ambulatory Visit: Payer: Self-pay | Admitting: Family Medicine

## 2018-01-01 NOTE — Telephone Encounter (Signed)
Name of Medication: Alprazolam Name of Pharmacy: [facility] or Written Date and Quantity: 9//30/19 #30/1 Last Office Visit and Type: 12/18/17 Next Office Visit and Type: 12/20/18 AWV Last Controlled Substance Agreement Date: none Last HQU:IQNV

## 2018-01-02 NOTE — Telephone Encounter (Addendum)
Electronic refill request. Alprazolam Last office visit:   12/18/17 Last Filled:    30 tablet 1 12/18/2017  Please advise.   Patient stated Alprazolam was sent on 12/18/17.  Pharmacy says they never received it. (Faxed copy is in Dr. Buckner [place] In Box). Pharmacy asks can you please re-send?

## 2018-01-02 NOTE — Telephone Encounter (Signed)
Check with patient. He should still have a refill on this, it appears early.  Thanks.

## 2018-01-02 NOTE — Telephone Encounter (Signed)
Pt states he never picked up this Rx and the pharmacy told him they never received a Rx from the dr. Lilian Coma you resend?  ALPRAZolam (XANAX) 0.25 MG tablet  [id number] [place] [place] [address] [phone] (Phone) [phone] (Fax)

## 2018-01-02 NOTE — Telephone Encounter (Signed)
Per order class on alprazolam rx done on 12/18/17 the prescription printed.Please advise.

## 2018-01-02 NOTE — Telephone Encounter (Signed)
Sent. Thanks.   

## 2018-01-02 NOTE — Telephone Encounter (Signed)
I spoke with Dr Damita Dunnings and he thought that he gave pt a printed rx for the alprazolam; pt is checking papers he received at the 12/18/17 visit to see if he has the alprazolam rx. Pt said he does not have the alprazolam rx. Will send note to Dr Damita Dunnings; pt said he has also spoken with Lugene CMA and she will take care of. FYI to Dr Damita Dunnings and Terri Skains.

## 2018-01-02 NOTE — Telephone Encounter (Signed)
[facility] not [facility] prescription for the medication. Pt is at the pharmacy for pick up.   Tried to call the office 2x , no answer.    Please advise.

## 2018-01-11 ENCOUNTER — Ambulatory Visit (INDEPENDENT_AMBULATORY_CARE_PROVIDER_SITE_OTHER): Payer: Medicare Other

## 2018-01-11 DIAGNOSIS — Z23 Encounter for immunization: Secondary | ICD-10-CM

## 2018-01-24 ENCOUNTER — Other Ambulatory Visit: Payer: Self-pay | Admitting: Family Medicine

## 2018-02-01 ENCOUNTER — Other Ambulatory Visit: Payer: Self-pay | Admitting: *Deleted

## 2018-02-01 MED ORDER — SYNTHROID 50 MCG PO TABS: ORAL_TABLET | ORAL | 3 refills | Status: DC

## 2018-02-13 ENCOUNTER — Other Ambulatory Visit: Payer: Self-pay | Admitting: Family Medicine

## 2018-02-13 NOTE — Telephone Encounter (Signed)
Electronic refill request. EpiPen Last office visit:   12/18/17 CPE Last Filled:    2 Device 0 12/05/2016  Please advise.

## 2018-02-14 NOTE — Telephone Encounter (Signed)
Sent. Thanks.   

## 2018-12-17 ENCOUNTER — Other Ambulatory Visit: Payer: Self-pay

## 2018-12-17 ENCOUNTER — Other Ambulatory Visit (INDEPENDENT_AMBULATORY_CARE_PROVIDER_SITE_OTHER): Payer: Medicare Other

## 2018-12-17 ENCOUNTER — Other Ambulatory Visit: Payer: Self-pay | Admitting: Family Medicine

## 2018-12-17 DIAGNOSIS — D696 Thrombocytopenia, unspecified: Secondary | ICD-10-CM | POA: Diagnosis not present

## 2018-12-17 DIAGNOSIS — E039 Hypothyroidism, unspecified: Secondary | ICD-10-CM | POA: Diagnosis not present

## 2018-12-17 DIAGNOSIS — E785 Hyperlipidemia, unspecified: Secondary | ICD-10-CM

## 2018-12-17 LAB — CBC WITH DIFFERENTIAL/PLATELET
Basophils Absolute: 0 10*3/uL (ref 0.0–0.1)
Basophils Relative: 1 % (ref 0.0–3.0)
Eosinophils Absolute: 0.1 10*3/uL (ref 0.0–0.7)
Eosinophils Relative: 2.9 % (ref 0.0–5.0)
HCT: 44.5 % (ref 39.0–52.0)
Hemoglobin: 15.6 g/dL (ref 13.0–17.0)
Lymphocytes Relative: 29.8 % (ref 12.0–46.0)
Lymphs Abs: 1.1 10*3/uL (ref 0.7–4.0)
MCHC: 35 g/dL (ref 30.0–36.0)
MCV: 96.1 fl (ref 78.0–100.0)
Monocytes Absolute: 0.3 10*3/uL (ref 0.1–1.0)
Monocytes Relative: 7.1 % (ref 3.0–12.0)
Neutro Abs: 2.2 10*3/uL (ref 1.4–7.7)
Neutrophils Relative %: 59.2 % (ref 43.0–77.0)
Platelets: 114 10*3/uL — ABNORMAL LOW (ref 150.0–400.0)
RBC: 4.63 Mil/uL (ref 4.22–5.81)
RDW: 14.1 % (ref 11.5–15.5)
WBC: 3.7 10*3/uL — ABNORMAL LOW (ref 4.0–10.5)

## 2018-12-17 LAB — LIPID PANEL
Cholesterol: 205 mg/dL — ABNORMAL HIGH (ref 0–200)
HDL: 37.4 mg/dL — ABNORMAL LOW (ref >39.00)
NonHDL: 167.56
Total CHOL/HDL Ratio: 5
Triglycerides: 229 mg/dL — ABNORMAL HIGH (ref 0.0–149.0)
VLDL: 45.8 mg/dL — ABNORMAL HIGH (ref 0.0–40.0)

## 2018-12-17 LAB — COMPREHENSIVE METABOLIC PANEL
ALT: 19 U/L (ref 0–53)
AST: 16 U/L (ref 0–37)
Albumin: 4.2 g/dL (ref 3.5–5.2)
Alkaline Phosphatase: 55 U/L (ref 39–117)
BUN: 16 mg/dL (ref 6–23)
CO2: 30 mEq/L (ref 19–32)
Calcium: 9.1 mg/dL (ref 8.4–10.5)
Chloride: 103 mEq/L (ref 96–112)
Creatinine, Ser: 0.95 mg/dL (ref 0.40–1.50)
GFR: 74.94 mL/min (ref >60.00)
Glucose, Bld: 101 mg/dL — ABNORMAL HIGH (ref 70–99)
Potassium: 4.1 mEq/L (ref 3.5–5.1)
Sodium: 140 mEq/L (ref 135–145)
Total Bilirubin: 0.7 mg/dL (ref 0.2–1.2)
Total Protein: 6.3 g/dL (ref 6.0–8.3)

## 2018-12-17 LAB — LDL CHOLESTEROL, DIRECT: Direct LDL: 116 mg/dL

## 2018-12-17 LAB — TSH: TSH: 2.04 u[IU]/mL (ref 0.35–4.50)

## 2018-12-20 ENCOUNTER — Ambulatory Visit: Payer: Medicare Other

## 2018-12-20 ENCOUNTER — Encounter: Payer: Self-pay | Admitting: Family Medicine

## 2018-12-20 ENCOUNTER — Other Ambulatory Visit: Payer: Self-pay

## 2018-12-20 ENCOUNTER — Ambulatory Visit (INDEPENDENT_AMBULATORY_CARE_PROVIDER_SITE_OTHER): Payer: Medicare Other | Admitting: Family Medicine

## 2018-12-20 ENCOUNTER — Telehealth: Payer: Self-pay | Admitting: Family Medicine

## 2018-12-20 VITALS — BP 130/76 | HR 62 | Temp 97.3°F | Ht 64.75 in | Wt 151.2 lb

## 2018-12-20 DIAGNOSIS — Z0000 Encounter for general adult medical examination without abnormal findings: Secondary | ICD-10-CM | POA: Diagnosis not present

## 2018-12-20 DIAGNOSIS — D696 Thrombocytopenia, unspecified: Secondary | ICD-10-CM

## 2018-12-20 DIAGNOSIS — R42 Dizziness and giddiness: Secondary | ICD-10-CM | POA: Diagnosis not present

## 2018-12-20 DIAGNOSIS — Z91018 Allergy to other foods: Secondary | ICD-10-CM

## 2018-12-20 DIAGNOSIS — Z23 Encounter for immunization: Secondary | ICD-10-CM

## 2018-12-20 DIAGNOSIS — E039 Hypothyroidism, unspecified: Secondary | ICD-10-CM

## 2018-12-20 DIAGNOSIS — N400 Enlarged prostate without lower urinary tract symptoms: Secondary | ICD-10-CM | POA: Diagnosis not present

## 2018-12-20 DIAGNOSIS — Z7189 Other specified counseling: Secondary | ICD-10-CM

## 2018-12-20 DIAGNOSIS — E785 Hyperlipidemia, unspecified: Secondary | ICD-10-CM

## 2018-12-20 DIAGNOSIS — F418 Other specified anxiety disorders: Secondary | ICD-10-CM

## 2018-12-20 MED ORDER — SYNTHROID 50 MCG PO TABS: ORAL_TABLET | ORAL | 3 refills | Status: DC

## 2018-12-20 MED ORDER — ALPRAZOLAM 0.25 MG PO TABS: ORAL_TABLET | ORAL | 1 refills | Status: DC

## 2018-12-20 NOTE — Progress Notes (Addendum)
I have personally reviewed the Medicare Annual Wellness questionnaire and have noted 1. The patient's medical and social history 2. Their use of alcohol, tobacco or illicit drugs 3. Their current medications and supplements 4. The patient's functional ability including ADL's, fall risks, home safety risks and hearing or visual             impairment. 5. Diet and physical activities 6. Evidence for depression or mood disorders  The patients weight, height, BMI have been recorded in the chart and visual acuity is per eye clinic.  I have made referrals, counseling and provided education to the patient based review of the above and I have provided the pt with a written personalized care plan for preventive services.  Provider list updated- see scanned forms.  Routine anticipatory guidance given to patient.  See health maintenance. The possibility exists that previously documented standard health maintenance information may have been brought forward from a previous encounter into this note.  If needed, that same information has been updated to reflect the current situation based on today's encounter.    Flu 2020 Shingles discussed with patient. PNA up-to-date Tetanus 2015  colon cancer screening not due given his age.  He agrees. Prostate cancer screening not due given his age.  He agrees Advance directive-wife designated if patient were incapacitated. Cognitive function addressed- see scanned forms- and if abnormal then additional documentation follows.   Slower stream.  Nocturia.  LUTS are chronic, not a new issue.  Not worse recently.  Discussed.  Would avoid flomax given his sulfa allergy. He can tolerate his situation as is.  No hematuria noted by patient.    Alpha gal allergy d/w pt. Routine cautions d/w pt.   H/o episodic vertigo w/o syncope.  Overall this is rare and not changed recently.  Hypothyroidism.  Compliant.  TSH normal.  No adverse effect on medication.  No dysphasia.  No  neck mass.  Elevated Cholesterol: on RYR w/o ADE Muscle aches: no Diet compliance:yes Exercise:no  Anxiety.  Rare use of BZD.  Pills were out of date from prior prescription, due to lack of frequent use.  No adverse effect on medication, when needed.  History of mild thrombocytopenia.  No bleeding.  History of mildly low white count, similar to previous.  CBC discussed with patient.  Overall CBC stable.  PMH and SH reviewed  Meds, vitals, and allergies reviewed.   ROS: Per HPI.  Unless specifically indicated otherwise in HPI, the patient denies:  General: fever. Eyes: acute vision changes ENT: sore throat Cardiovascular: chest pain Respiratory: SOB GI: vomiting GU: dysuria Musculoskeletal: acute back pain Derm: acute rash Neuro: acute motor dysfunction Psych: worsening mood Endocrine: polydipsia Heme: bleeding Allergy: hayfever  GEN: nad, alert and oriented HEENT: ncat NECK: supple w/o LA CV: rrr. PULM: ctab, no inc wob ABD: soft, +bs EXT: no edema SKIN: no acute rash  Health Maintenance  Topic Date Due  . TETANUS/TDAP  11/20/2023  . INFLUENZA VACCINE  Completed  . PNA vac Low Risk Adult  Completed

## 2018-12-20 NOTE — Telephone Encounter (Signed)
Patient stated his insurance company was faxing over paperwork today to our office in regards to the patient's Synthroid. Hs stated that he has been paying $105 for years at the pharmacy and his insurance stated if he had sent to his home by mail it would only cost him $13.00/  They are just needing approval from his provider for them to send it out to him.

## 2018-12-20 NOTE — Patient Instructions (Addendum)
Check with your insurance to see if they will cover the shingrix shot.   Flu shot today.   Don't change your meds for now and update me as needed.  Take care.  Glad to see you.

## 2018-12-21 ENCOUNTER — Other Ambulatory Visit: Payer: Self-pay | Admitting: *Deleted

## 2018-12-21 ENCOUNTER — Telehealth: Payer: Self-pay | Admitting: *Deleted

## 2018-12-21 MED ORDER — SYNTHROID 50 MCG PO TABS: ORAL_TABLET | ORAL | 3 refills | Status: DC

## 2018-12-21 NOTE — Telephone Encounter (Signed)
[facility]

## 2018-12-23 DIAGNOSIS — Z0000 Encounter for general adult medical examination without abnormal findings: Secondary | ICD-10-CM | POA: Insufficient documentation

## 2018-12-23 NOTE — Assessment & Plan Note (Signed)
History of mild thrombocytopenia.  No bleeding.  History of mildly low white count, similar to previous.  CBC discussed with patient.  Overall CBC stable.

## 2018-12-23 NOTE — Assessment & Plan Note (Signed)
Compliant.  TSH normal.  No adverse effect on medication.  No dysphasia.  No neck mass.  Continue as is.  He agrees.

## 2018-12-23 NOTE — Assessment & Plan Note (Signed)
Alpha gal allergy d/w pt. Routine cautions d/w pt.

## 2018-12-23 NOTE — Assessment & Plan Note (Signed)
Rare use of BZD.  Pills were out of date from prior prescription, due to lack of frequent use.  No adverse effect on medication, when needed.

## 2018-12-23 NOTE — Assessment & Plan Note (Signed)
LUTS are chronic, not a new issue.  Not worse recently.  Discussed.  Would avoid flomax given his sulfa allergy. He can tolerate his situation as is.  No hematuria noted by patient.

## 2018-12-23 NOTE — Assessment & Plan Note (Signed)
Labs discussed with patient.  He can tolerate red yeast rice.  Continue as is.

## 2018-12-23 NOTE — Assessment & Plan Note (Signed)
Advance directive- wife designated if patient were incapacitated.  

## 2018-12-23 NOTE — Assessment & Plan Note (Signed)
H/o episodic vertigo w/o syncope.  Overall this is rare and not changed recently.

## 2018-12-23 NOTE — Assessment & Plan Note (Signed)
Flu 2020 Shingles discussed with patient. PNA up-to-date Tetanus 2015  colon cancer screening not due given his age.  He agrees. Prostate cancer screening not due given his age.  He agrees Advance directive-wife designated if patient were incapacitated. Cognitive function addressed- see scanned forms- and if abnormal then additional documentation follows.

## 2018-12-24 ENCOUNTER — Other Ambulatory Visit: Payer: Self-pay | Admitting: *Deleted

## 2018-12-24 MED ORDER — LEVOTHYROXINE SODIUM 50 MCG PO TABS: ORAL_TABLET | ORAL | 3 refills | Status: DC

## 2018-12-24 NOTE — Telephone Encounter (Signed)
[facility] @ [facility] rx needs to be resent without brand only.  They will send brand only but if it is not on rx he will get generic pricing  Any questions  [phone]

## 2019-01-03 ENCOUNTER — Telehealth: Payer: Self-pay | Admitting: Family Medicine

## 2019-01-03 NOTE — Telephone Encounter (Signed)
Constance Holster from [facility] prescription services called and stated  a prescription for synthroid was sent for the patient. She stated a new prescription needs to be sent written for Synthroid but generic allowable. The original prescription that was sent is only for brand name.

## 2019-01-04 MED ORDER — LEVOTHYROXINE SODIUM 50 MCG PO TABS: 50.0000 ug | ORAL_TABLET | Freq: Every day | ORAL | 3 refills | Status: DC

## 2019-01-04 NOTE — Telephone Encounter (Signed)
Left message to have patient call back. RX was sent [facility]. I do not know if Constance Holster is calling from the same place or not maybe that is the issue. There is no CB number for [facility]

## 2019-01-04 NOTE — Telephone Encounter (Signed)
As far as I can tell on 12/24/2018 RX was sent in for generic. Ok to resend? Please review? Thank you

## 2019-01-04 NOTE — Telephone Encounter (Signed)
I resent it with okay to dispense with generic on sig.  Please notify pt.  Thanks.

## 2019-01-04 NOTE — Telephone Encounter (Signed)
Larry [name] called with Larry [name] on the phone to confirm that we had the correct information for the medication request. I let her know that a message was sent to the [place]. Larry [name] confirmed that the correct information was sent. I did let her know that it was showing that the prescription was written for generic. She stated that the prescription she received says dispense as written, brand only. The patient is concerned that he will run out of his medication. He has a 1 week supply left.

## 2019-04-04 ENCOUNTER — Telehealth: Payer: Self-pay | Admitting: Family Medicine

## 2019-04-04 NOTE — Telephone Encounter (Signed)
Patient's wife [name] is calling wanting to know if there is any concern with the patient getting his Covid vaccine.  Pt has Alpha-Gal allergies and they are wanting to make sure that there is nothing to worry about and that its okay to get the vaccine.   Please advise, thanks.

## 2019-04-04 NOTE — Telephone Encounter (Signed)
I called and spoke with the patients wife and informed her that the provider stated it would be okay to have the covid vaccine and to proceed with the vaccine, she understood.  Delailah Spieth,cma

## 2019-04-04 NOTE — Telephone Encounter (Signed)
This should be okay to do, based on available evidence.  I would proceed with the vaccine.  Thanks.

## 2019-04-10 ENCOUNTER — Telehealth: Payer: Self-pay

## 2019-04-10 NOTE — Telephone Encounter (Signed)
Patient called in reporting severe shaking after receiving COVID19 vaccine yesterday in [name], [place] - DOB/Address verified - advised patient to contact PCP as instructed regarding his symptoms or go to ER for further evaluation and treatment, no further questions at this time.

## 2019-04-11 NOTE — Telephone Encounter (Signed)
Pt said his reaction was "really bad" and since he doesn't know Dr. Glori Bickers he wants to wait until Dr. Damita Dunnings comes back to get his opinion. Pt said he "shook" for hrs and it "messed his stomach up" so he will wait and see what Dr. Damita Dunnings thinks

## 2019-04-11 NOTE — Telephone Encounter (Signed)
I will give the best answer I can, based on available evidence at this point.  I agree with Dr. Glori Bickers and that if he did not have a clear allergic reaction (such as swelling, anaphylaxis, etc.) then he should be able to get the second dose.  There are some considerations about this.  Chills and GI upset are not uncommon side effects with this vaccine.  Symptoms are generally self-limited/mild and usually resolve within a day.  The symptoms are usually a byproduct of the immune system successfully processing the vaccine.  Some patients have more symptoms after the vaccine than others.  The symptoms tend to be more pronounced in younger patients and also after the second shot compared to the first.  I have to defer to the patient on the final decision.  Covid is a serious disease and if possible I think it makes sense to go through with vaccination with the second dose.  It is possible that he could have a similar reaction with the second dose but I would still expect that to be manageable/temporary but annoying.  Please have him think about this in the meantime and then update me as needed.  Thanks.

## 2019-04-11 NOTE — Telephone Encounter (Signed)
I spoke with pt's wife (DPR signed) and pt got covid shot on 04/09/19.pt is feeling better today; feels normal. Chills and abd pain stopped last night after approx 2 hrs. Pt wants to know if should get the 2nd covid vaccine in 3 wks. Dr Damita Dunnings is out of office; sending note to Dr Glori Bickers Please advise. Pt request cb.

## 2019-04-11 NOTE — Telephone Encounter (Signed)
Since that was not an allergic reaction , I would recommend still getting the 2nd vaccine.  I will cc to Dr Damita Dunnings get his advisement as well

## 2019-04-11 NOTE — Telephone Encounter (Signed)
Pt's wife (per DPR) notified of Dr. Josefine Class comments and instructions and recommendations and she verbalized understanding. She will have pt think about if he will get the 2nd vaccine and call us back if needed

## 2019-04-11 NOTE — Telephone Encounter (Signed)
[place] Night - Client TELEPHONE ADVICE RECORD [facility] Patient Name: Larry [name] Gender: Male DOB: 1929-11-13 Age: 86 Y 28 M 19 D Return Phone Number: [phone] (Primary) Address: City/State/Zip: [place] [address] Client [place] Night - Client Client Site [place] Physician Renford Dills - MD Contact Type Call Who Is Calling Patient / Member / Family / Caregiver Call Type Triage / Clinical Relationship To Patient Self Return Phone Number [phone] (Primary) Chief Complaint Abdominal Pain Reason for Call Symptomatic / Request for [name] states they had a covid shot yesterday and is shaking with chills. He is also having abdomen pain, he would like to know what he can take. Translation No Nurse Assessment Nurse: Joya Gaskins, RN, Vonna Kotyk Date/Time Eilene Ghazi Time): 04/10/2019 9:56:21 PM Confirm and document reason for call. If symptomatic, describe symptoms. ---Caller states they had a covid-19 shot yesterday and is now having shaking with chills. He is also having abdomen pain after eating dinner. Both episodes were brief. Has the patient had close contact with a person known or suspected to have the novel coronavirus illness OR traveled / lives in area with major community spread (including international travel) in the last 14 days from the onset of symptoms? * If Asymptomatic, screen for exposure and travel within the last 14 days. ---No Does the patient have any new or worsening symptoms? ---Yes Will a triage be completed? ---Yes Related visit to physician within the last 2 weeks? ---Yes Does the PT have any chronic conditions? (i.e. diabetes, asthma, this includes High risk factors for pregnancy, etc.) ---Yes List chronic conditions. ---thyroid Is this a behavioral health or substance abuse call? ---No Guidelines Guideline Title Affirmed Question  Affirmed Notes Nurse Date/Time (Eastern Time) Coronavirus (COVID-19) - Vaccine Questions and Reactions COVID-19 vaccine, systemic reactions (e.g., fatigue, fever, muscle aches), questions about [name] 04/10/2019 9:58:20 PM Disp. Time Eilene Ghazi Time) Disposition Final User PLEASE NOTE: All timestamps contained within this report are represented as [place] Standard Time. CONFIDENTIALTY NOTICE: This fax transmission is intended only for the addressee. It contains information that is legally privileged, confidential or otherwise protected from use or disclosure. If you are not the intended recipient, you are strictly prohibited from reviewing, disclosing, copying using or disseminating any of this information or taking any action in reliance on or regarding this information. If you have received this fax in error, please notify us immediately by telephone so that we can arrange for its return to [place]. Phone: [phone], Toll-Free: [phone], Fax: [phone] Page: 2 of 2 Call Id: [id number] 04/10/2019 10:01:35 PM Home Care Yes Joya Gaskins, RN, Aviva Kluver Disagree/Comply Comply Caller Understands Yes PreDisposition Did not know what to do Care Advice Given Per Guideline HOME CARE: * You should be able to treat this at home. * ACETAMINOPHEN REGULAR STRENGTH TYLENOL: Take 650 mg (two 325 mg pills) by mouth every 4-6 hours as needed. Each Regular Strength Tylenol pill has 325 mg of acetaminophen. The most you should take each day is 3,250 mg (10 pills a day). CALL BACK IF: * Fever lasts over 3 days * Pain at injection site not improving after 3 days * You become worse. CARE ADVICE given per Coronavirus (COVID-19) - Vaccine Questions and Reactions (Adult) guideline.

## 2019-05-06 ENCOUNTER — Telehealth: Payer: Self-pay | Admitting: Radiology

## 2019-05-06 ENCOUNTER — Telehealth: Payer: Self-pay | Admitting: Family Medicine

## 2019-05-06 DIAGNOSIS — R739 Hyperglycemia, unspecified: Secondary | ICD-10-CM

## 2019-05-06 NOTE — Telephone Encounter (Signed)
error 

## 2019-05-06 NOTE — Telephone Encounter (Signed)
Patient needs to schedule a non fasting lab appt

## 2019-05-06 NOTE — Telephone Encounter (Signed)
Pt called back stating he had 2nd covid vaccine 05/04/19  pfizer.  He read that this could cause blood sugar to go up

## 2019-05-06 NOTE — Telephone Encounter (Signed)
Patient stated that he would like to come back in to have his A1C checked.  He stated that it has been running over 117. Patient went an bought new test strips and it received a reading of 117.

## 2019-05-06 NOTE — Telephone Encounter (Signed)
Patient has been scheduled

## 2019-05-06 NOTE — Telephone Encounter (Signed)
LVM for patient to call and schedule a lab appt

## 2019-05-06 NOTE — Telephone Encounter (Signed)
I put in the order for POC A1c.  Please schedule a nonfasting lab visit when possible.  Thanks.

## 2019-05-09 ENCOUNTER — Observation Stay (HOSPITAL_COMMUNITY): Payer: Medicare Other

## 2019-05-09 ENCOUNTER — Other Ambulatory Visit: Payer: Medicare Other

## 2019-05-09 ENCOUNTER — Other Ambulatory Visit: Payer: Self-pay

## 2019-05-09 ENCOUNTER — Emergency Department (HOSPITAL_COMMUNITY): Payer: Medicare Other

## 2019-05-09 ENCOUNTER — Encounter (HOSPITAL_COMMUNITY): Payer: Self-pay | Admitting: Emergency Medicine

## 2019-05-09 ENCOUNTER — Observation Stay (HOSPITAL_COMMUNITY)
Admission: EM | Admit: 2019-05-09 | Discharge: 2019-05-10 | Disposition: A | Discharge status: Home / Self Care | Payer: Medicare Other | Attending: Internal Medicine | Admitting: Internal Medicine

## 2019-05-09 DIAGNOSIS — E039 Hypothyroidism, unspecified: Secondary | ICD-10-CM | POA: Diagnosis not present

## 2019-05-09 DIAGNOSIS — Z79899 Other long term (current) drug therapy: Secondary | ICD-10-CM | POA: Insufficient documentation

## 2019-05-09 DIAGNOSIS — D696 Thrombocytopenia, unspecified: Secondary | ICD-10-CM | POA: Insufficient documentation

## 2019-05-09 DIAGNOSIS — R4701 Aphasia: Secondary | ICD-10-CM | POA: Diagnosis present

## 2019-05-09 DIAGNOSIS — Z885 Allergy status to narcotic agent status: Secondary | ICD-10-CM | POA: Insufficient documentation

## 2019-05-09 DIAGNOSIS — G459 Transient cerebral ischemic attack, unspecified: Secondary | ICD-10-CM | POA: Diagnosis not present

## 2019-05-09 DIAGNOSIS — Z882 Allergy status to sulfonamides status: Secondary | ICD-10-CM | POA: Diagnosis not present

## 2019-05-09 DIAGNOSIS — E781 Pure hyperglyceridemia: Secondary | ICD-10-CM | POA: Insufficient documentation

## 2019-05-09 DIAGNOSIS — Z20822 Contact with and (suspected) exposure to covid-19: Secondary | ICD-10-CM | POA: Diagnosis not present

## 2019-05-09 DIAGNOSIS — K219 Gastro-esophageal reflux disease without esophagitis: Secondary | ICD-10-CM | POA: Diagnosis not present

## 2019-05-09 DIAGNOSIS — E785 Hyperlipidemia, unspecified: Secondary | ICD-10-CM | POA: Diagnosis not present

## 2019-05-09 DIAGNOSIS — Z888 Allergy status to other drugs, medicaments and biological substances status: Secondary | ICD-10-CM | POA: Insufficient documentation

## 2019-05-09 LAB — COMPREHENSIVE METABOLIC PANEL
ALT: 20 U/L (ref 0–44)
AST: 20 U/L (ref 15–41)
Albumin: 3.8 g/dL (ref 3.5–5.0)
Alkaline Phosphatase: 53 U/L (ref 38–126)
Anion gap: 8 (ref 5–15)
BUN: 13 mg/dL (ref 8–23)
CO2: 25 mmol/L (ref 22–32)
Calcium: 9 mg/dL (ref 8.9–10.3)
Chloride: 103 mmol/L (ref 98–111)
Creatinine, Ser: 0.83 mg/dL (ref 0.61–1.24)
GFR calc Af Amer: >60 mL/min (ref >60)
GFR calc non Af Amer: >60 mL/min (ref >60)
Glucose, Bld: 124 mg/dL — ABNORMAL HIGH (ref 70–99)
Potassium: 3.8 mmol/L (ref 3.5–5.1)
Sodium: 136 mmol/L (ref 135–145)
Total Bilirubin: 1 mg/dL (ref 0.3–1.2)
Total Protein: 6.1 g/dL — ABNORMAL LOW (ref 6.5–8.1)

## 2019-05-09 LAB — CBC
HCT: 43.4 % (ref 39.0–52.0)
Hemoglobin: 15.2 g/dL (ref 13.0–17.0)
MCH: 33 pg (ref 26.0–34.0)
MCHC: 35 g/dL (ref 30.0–36.0)
MCV: 94.3 fL (ref 80.0–100.0)
Platelets: 114 10*3/uL — ABNORMAL LOW (ref 150–400)
RBC: 4.6 MIL/uL (ref 4.22–5.81)
RDW: 13.4 % (ref 11.5–15.5)
WBC: 4.2 10*3/uL (ref 4.0–10.5)
nRBC: 0 % (ref 0.0–0.2)

## 2019-05-09 LAB — URINALYSIS, ROUTINE W REFLEX MICROSCOPIC
Bacteria, UA: NONE SEEN
Bilirubin Urine: NEGATIVE
Glucose, UA: NEGATIVE mg/dL
Ketones, ur: NEGATIVE mg/dL
Leukocytes,Ua: NEGATIVE
Nitrite: NEGATIVE
Protein, ur: NEGATIVE mg/dL
Specific Gravity, Urine: 1.006 (ref 1.005–1.030)
pH: 7 (ref 5.0–8.0)

## 2019-05-09 MED ORDER — ACETAMINOPHEN 325 MG PO TABS: 650.0000 mg | ORAL_TABLET | ORAL | Status: DC | PRN

## 2019-05-09 MED ORDER — SODIUM CHLORIDE 0.9 % IV SOLN: INTRAVENOUS | Status: DC

## 2019-05-09 MED ORDER — ASPIRIN 300 MG RE SUPP: 300.0000 mg | Freq: Every day | RECTAL | Status: DC

## 2019-05-09 MED ORDER — STROKE: EARLY STAGES OF RECOVERY BOOK: Freq: Once | Status: AC

## 2019-05-09 MED ORDER — ACETAMINOPHEN 160 MG/5ML PO SOLN: 650.0000 mg | ORAL | Status: DC | PRN

## 2019-05-09 MED ORDER — ASPIRIN 325 MG PO TABS
325.0000 mg | ORAL_TABLET | Freq: Every day | ORAL | Status: DC
  Administered 2019-05-09 – 2019-05-10 (×2): 325 mg via ORAL
  Filled 2019-05-09 (×2): qty 1

## 2019-05-09 MED ORDER — LORAZEPAM 2 MG/ML IJ SOLN
1.0000 mg | Freq: Once | INTRAMUSCULAR | Status: DC | PRN
  Filled 2019-05-09: qty 1

## 2019-05-09 MED ORDER — ACETAMINOPHEN 650 MG RE SUPP: 650.0000 mg | RECTAL | Status: DC | PRN

## 2019-05-09 MED ORDER — SENNOSIDES-DOCUSATE SODIUM 8.6-50 MG PO TABS: 1.0000 | ORAL_TABLET | Freq: Every evening | ORAL | Status: DC | PRN

## 2019-05-09 MED ORDER — IOHEXOL 350 MG/ML SOLN
75.0000 mL | Freq: Once | INTRAVENOUS | Status: AC | PRN
  Administered 2019-05-09: 75 mL via INTRAVENOUS

## 2019-05-09 MED ORDER — PRAVASTATIN SODIUM 40 MG PO TABS
80.0000 mg | ORAL_TABLET | Freq: Every day | ORAL | Status: DC
  Filled 2019-05-09: qty 2

## 2019-05-09 MED ORDER — HEPARIN SODIUM (PORCINE) 5000 UNIT/ML IJ SOLN: 5000.0000 [IU] | Freq: Three times a day (TID) | INTRAMUSCULAR | Status: DC

## 2019-05-09 NOTE — H&P (Signed)
History and Physical    Larry [name] [id number] DOB: [birth year] DOA: 05/09/2019  PCP: Tonia Ghent, MD Consultants:  Gwenlyn Found - cardiology Patient coming from:  Home - lives with wife; NOK: Wife, Larry [name], [phone]  Chief Complaint: Transient aphasia  HPI: Larry [name] is a 86 y.o. male with medical history significant of hypothyroidism; thrombocytopenia; and HLD presenting with transient aphasia.  He had his 2nd COVID shot recently.  He was looking at his grocery list and couldn't talk night before last.  He made sounds but couldn't say words.  This lasted for <1 minute.  They called his daughter and she came over and his symptoms went away.  They eventually said he should come in for evaluation even though he hasn't had more problems.  No dysphagia or n/w/t otherwise.  He had had a hard day and was very tired, and he isn't sure it didn't have anything to do with the COVID shot he had received 5 days prior.  He reports that he can not have a closed MRI and would like to go home and have that done as an outpatient.  He was going to be sent for an open MRI by his PCP but the weather meant that couldn't happen and so he was sent to the ER instead.   ED Course:  Likely TIA - last night, expressive and receptive aphasia, resolved after a few hours.  Feels fine now.  Neuro recommends observation and they will consult.  Review of Systems: As per HPI; otherwise review of systems reviewed and negative.   Ambulatory Status:  Ambulates without assistance  Past Medical History:  Diagnosis Date  . 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 Esophageal reflux   . Family history of diabetes mellitus   . History of chickenpox   . Hypertriglyceridemia   . Other and unspecified hyperlipidemia   . Pre-syncope   . Thrombocytopenia, unspecified ([place])   . Unspecified hypothyroidism     Past Surgical History:  Procedure Laterality Date  . APPENDECTOMY  1949    Social History    Socioeconomic History  . Marital status: Married    Spouse name: Not on file  . Number of children: 4  . Years of education: Not on file  . Highest education level: Not on file  Occupational History  . Occupation: Retired from [employer]: RETIRED  Tobacco Use  . Smoking status: Never Smoker  . Smokeless tobacco: Never Used  . Tobacco comment: smoked only as a teenage small amount  Substance and Sexual Activity  . Alcohol use: No  . Drug use: No  . Sexual activity: Not on file  Other Topics Concern  . Not on file  Social History Narrative   Caffeine:  Coffee, tea, 2+ daily.   Diet:  Fish, chicken, vegetables   Regular exercise:  Yes, yard work, gardening   Married 1953   4 kids   Social Determinants of Health   Financial Resource Strain:   . Difficulty of Paying Living Expenses: Not on file  Food Insecurity:   . Worried About Charity fundraiser in the Last Year: Not on file  . Ran Out of Food in the Last Year: Not on file  Transportation Needs:   . Lack of Transportation (Medical): Not on file  . Lack of Transportation (Non-Medical): Not on file  Physical Activity:   . Days of Exercise per Week: Not on file  . Minutes  of Exercise per Session: Not on file  Stress:   . Feeling of Stress : Not on file  Social Connections:   . Frequency of Communication with Friends and Family: Not on file  . Frequency of Social Gatherings with Friends and Family: Not on file  . Attends Religious Services: Not on file  . Active Member of Clubs or Organizations: Not on file  . Attends Archivist Meetings: Not on file  . Marital Status: Not on file  Intimate Partner Violence:   . Fear of Current or Ex-Partner: Not on file  . Emotionally Abused: Not on file  . Physically Abused: Not on file  . Sexually Abused: Not on file    Allergies  Allergen Reactions  . Alpha-Gal Other (See Comments)  . Antihistamines, Diphenhydramine-Type     Urinary retention  .  Atorvastatin     REACTION: Intolerance  . Beef-Derived Products     Tolerates chicken/fish/turkey  . Ezetimibe     REACTION: Intolerance  . Other     ALPHAGAL- see above re: beef allergy  . Pork-Derived Products Other (See Comments)    Due to alpha gal hx  . Simvastatin     REACTION: Intolerance  . Sulfonamide Derivatives     REACTION: hives, itching  . Tramadol Other (See Comments)    Lightheaded    Family History  Problem Relation Age of Onset  . Stroke Mother   . Diabetes Mother        borderline  . Pneumonia Father   . Hypertension Sister   . Hyperlipidemia Sister   . Alzheimer's disease Brother   . Dementia Brother   . Liver disease Sister        cirrhosis, nondrinker  . Arthritis Other   . Diabetes Other        1st degree relative  . Hyperlipidemia Other   . Hypertension Other   . Prostate cancer Neg Hx   . Colon cancer Neg Hx     Prior to Admission medications   Medication Sig Start Date End Date Taking? Authorizing Provider  ALPRAZolam Duanne Moron) 0.25 MG tablet Use 1/2 tab twice daily as needed. 12/20/18   Tonia Ghent, MD  calcium carbonate (TUMS - DOSED IN MG ELEMENTAL CALCIUM) 500 MG chewable tablet Chew 1 tablet by mouth daily as needed for indigestion or heartburn.    [provider]  Coenzyme Q10 (CO Q 10 PO) Take 1 capsule by mouth daily.    [provider]  diclofenac sodium (VOLTAREN) 1 % GEL Apply 2 g topically 4 (four) times daily as needed. 02/15/17   Tonia Ghent, MD  EPINEPHrine 0.3 mg/0.3 mL IJ SOAJ injection INJECT 0.3 MLS (1 PEN) INTO THE MUSCLE ONCE. AS NEEDED 02/14/18   Tonia Ghent, MD  folic acid (FOLVITE) 1 MG tablet Take 1 mg by mouth daily.    [provider]  KRILL OIL PO Take 1 tablet by mouth daily.    [provider]  levothyroxine (SYNTHROID) 50 MCG tablet Take 1 tablet (50 mcg total) by mouth daily before breakfast. Okay to dispense generic 01/04/19   Tonia Ghent, MD  methotrexate  (RHEUMATREX) 2.5 MG tablet Take 2 tablets (5 mg total) by mouth once a week. Caution:Chemotherapy. Protect from light. 12/18/17   Tonia Ghent, MD  Multiple Vitamins-Minerals (MULTIVITAMIN WITH MINERALS) tablet Take 1 tablet by mouth daily.    [provider]  Omega-3 Fatty Acids (FISH OIL PO) Take 1  tablet by mouth daily.    [provider]  Red Yeast Rice Extract (RED YEAST RICE PO) Take 1 tablet by mouth daily.    [provider]    Physical Exam: Vitals:   05/09/19 1236 05/09/19 1247 05/09/19 1315 05/09/19 1512  BP: 110/82 (!) 156/75 140/74 (!) 159/83  Pulse: 72 78 67 60  Resp: 18 14 17 19   Temp: 98.2 F (36.8 C)     TempSrc: Oral     SpO2: 98% 95% 95% 96%     . General:  Appears calm and comfortable and is NAD, dressed, sitting up on the bed, ready to leave . Eyes:  PERRL, EOMI, normal lids, iris . ENT:  grossly normal hearing, lips & tongue, mmm . Neck:  no LAD, masses or thyromegaly; no carotid bruits . Cardiovascular:  RRR, no m/r/g. No LE edema.  Marland Kitchen Respiratory:   CTA bilaterally with no wheezes/rales/rhonchi.  Normal respiratory effort. . Abdomen:  soft, NT, ND, NABS . Back:   normal alignment, no CVAT . Skin:  no rash or induration seen on limited exam . Musculoskeletal:  grossly normal tone BUE/BLE, good ROM, no bony abnormality . Psychiatric:  grossly normal mood and affect, speech fluent and appropriate, AOx3 . Neurologic:  CN 2-12 grossly intact, moves all extremities in coordinated fashion    Radiological Exams on Admission: CT HEAD WO CONTRAST  Result Date: 05/09/2019 CLINICAL DATA:  Speech difficulty, now resolved EXAM: CT HEAD WITHOUT CONTRAST TECHNIQUE: Contiguous axial images were obtained from the base of the skull through the vertex without intravenous contrast. COMPARISON:  2016 FINDINGS: Brain: There is no acute intracranial hemorrhage, mass-effect, or edema. Gray-white differentiation is preserved. There is no extra-axial  fluid collection. Patchy hypoattenuation in the supratentorial white matter is nonspecific but probably reflects similar mild chronic microvascular ischemic changes. Ventricles and sulci are within normal limits in size and configuration. Vascular: There is atherosclerotic calcification at the skull base. Skull: Calvarium is unremarkable. Sinuses/Orbits: Retention cyst or polyp of the right maxillary sinus. Orbits are unremarkable. Other: Mastoid air cells are clear. IMPRESSION: No acute intracranial hemorrhage, mass effect, or evidence of acute infarction. Electronically Signed   By: Macy Mis M.D.   On: 05/09/2019 13:46    EKG: Independently reviewed.  NSR with rate 79; no evidence of acute ischemia   Labs on Admission: I have personally reviewed the available labs and imaging studies at the time of the admission.  Pertinent labs:   Glucose 124 Platelets 114 Lipids 12/17/18: 205/37/116/229   Assessment/Plan Principal Problem:   TIA (transient ischemic attack) Active Problems:   Hypothyroidism   HLD (hyperlipidemia)   Thrombocytopenia (HCC)   TIA -Patient without h/o CVA presenting with a transient episode of aphasia 36-48 hours ago -Symptoms have resolved but are concerning for TIA -Will place in observation status for CVA/TIA evaluation -Telemetry monitoring -MRI with 2 mg Ativan pre-procedure, as the patient reports that he will not be able to tolerate; if unable to do MRI, he can have an open MRI as an outpatient -CTA head/neck -Echo -Risk stratification with FLP, A1c -ASA daily -Neurology consult -PT/OT/ST/Nutrition Consults  Hypothyroidism -Normal TSH in 11/2018 -Continue Synthroid at current dose for now  HLD -Check FLP -He has had statin intolerance in the past so will try 80 mg Pravastatin (lipophilic)   Thrombocytopenia -Appears to be chronic and stable  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      DVT prophylaxis:  SCDs Code Status: DNR - confirmed with patient Family Communication: None present Disposition Plan:  Home once clinically improved Consults called: Neurology; PT/OT/ST/Nutrition  Admission status: It is my clinical opinion that referral for OBSERVATION is reasonable and necessary in this patient based on the above information provided. The aforementioned taken together are felt to place the patient at high risk for further clinical deterioration. However it is anticipated that the patient may be medically stable for discharge from the hospital within 24 to 48 hours.    Karmen Bongo MD [facility]   How to contact the [facility] Attending or Consulting provider [place] or covering provider during after hours [place], for this patient?  1. Check the care team in [facility] and look for a) attending/consulting [facility] provider listed and b) the [facility] team listed 2. Log into [email or web address] and use [place]'s universal password to access. If you do not have the password, please contact the hospital operator. 3. Locate the [facility] provider you are looking for under [facility] and page to a number that you can be directly reached. 4. If you still have difficulty reaching the provider, please page the [facility] (Director on Call) for the Hospitalists listed on amion for assistance.   05/09/2019, 4:26 PM

## 2019-05-09 NOTE — Consult Note (Signed)
Neurology Consultation  Reason for Consult: TIA Referring Physician: Dr. Lorin Mercy  CC: Transient difficulty expressing and reading  History is obtained from: Patient  HPI: Larry [name] is a 86 y.o. male with history of hypothyroidism, thrombocytopenia, high cholesterol.  States that yesterday at approximately 2000 hrs. he was about to go to the grocery store when his wife handed him the grocery list.  Patient had a very brief episode to which she was unable to express himself and/or read the grocery list.  There is concern for TIA thus neurology was consulted.  Patient states that he has no further symptoms nor has he had any further symptoms since this happened.  He did call his PCP who recommended he immediately go to the emergency room.  At this time patient does not want to stay due to to his fear of the MRI.  He is very claustrophobic even in an open MRI.  After discussing with patient and telling him his risk factors he agreed to stay to get the other tests finished.  Patient admits that he used to take aspirin but does not take aspirin at this time.  He is unable to take statins  ED course  CT head shows-no acute intracranial hemorrhage, mass-effect, or evidence of acute infarction  Chart review (none)  LKW: 2000 hrs. on 05/07/2019 tpa given?: no, out of window and no symptoms Premorbid modified Rankin scale (mRS): 0 NIH stroke scale: 0   Past Medical History:  Diagnosis Date  . 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 Esophageal reflux   . Family history of diabetes mellitus   . History of chickenpox   . Hypertriglyceridemia   . Other and unspecified hyperlipidemia   . Pre-syncope   . Thrombocytopenia, unspecified ([place])   . Unspecified hypothyroidism     Family History  Problem Relation Age of Onset  . Stroke Mother   . Diabetes Mother        borderline  . Pneumonia Father   . Hypertension Sister   . Hyperlipidemia Sister   . Alzheimer's disease Brother   .  Dementia Brother   . Liver disease Sister        cirrhosis, nondrinker  . Arthritis Other   . Diabetes Other        1st degree relative  . Hyperlipidemia Other   . Hypertension Other   . Prostate cancer Neg Hx   . Colon cancer Neg Hx     Social History:   reports that he has never smoked. He has never used smokeless tobacco. He reports that he does not drink alcohol or use drugs.  Medications  Current Facility-Administered Medications:  .  LORazepam (ATIVAN) injection 1 mg, 1 mg, Intravenous, Once PRN, Couture, Cortni S, PA-C  Current Outpatient Medications:  .  ALPRAZolam (XANAX) 0.25 MG tablet, Use 1/2 tab twice daily as needed., Disp: 30 tablet, Rfl: 1 .  calcium carbonate (TUMS - DOSED IN MG ELEMENTAL CALCIUM) 500 MG chewable tablet, Chew 1 tablet by mouth daily as needed for indigestion or heartburn., Disp: , Rfl:  .  Coenzyme Q10 (CO Q 10 PO), Take 1 capsule by mouth daily., Disp: , Rfl:  .  diclofenac sodium (VOLTAREN) 1 % GEL, Apply 2 g topically 4 (four) times daily as needed., Disp: 100 g, Rfl: 2 .  EPINEPHrine 0.3 mg/0.3 mL IJ SOAJ injection, INJECT 0.3 MLS (1 PEN) INTO THE MUSCLE ONCE. AS NEEDED, Disp: 2 Device, Rfl: 0 .  folic  acid (FOLVITE) 1 MG tablet, Take 1 mg by mouth daily., Disp: , Rfl:  .  KRILL OIL PO, Take 1 tablet by mouth daily., Disp: , Rfl:  .  levothyroxine (SYNTHROID) 50 MCG tablet, Take 1 tablet (50 mcg total) by mouth daily before breakfast. Okay to dispense generic, Disp: 90 tablet, Rfl: 3 .  methotrexate (RHEUMATREX) 2.5 MG tablet, Take 2 tablets (5 mg total) by mouth once a week. Caution:Chemotherapy. Protect from light., Disp: , Rfl:  .  Multiple Vitamins-Minerals (MULTIVITAMIN WITH MINERALS) tablet, Take 1 tablet by mouth daily., Disp: , Rfl:  .  Omega-3 Fatty Acids (FISH OIL PO), Take 1 tablet by mouth daily., Disp: , Rfl:  .  Red Yeast Rice Extract (RED YEAST RICE PO), Take 1 tablet by mouth daily., Disp: , Rfl:   ROS:   General ROS: negative  for - chills, fatigue, fever, night sweats, weight gain or weight loss Psychological ROS: negative for - behavioral disorder, hallucinations, memory difficulties, mood swings or suicidal ideation Ophthalmic ROS: negative for - blurry vision, double vision, eye pain or loss of vision ENT ROS: negative for - epistaxis, nasal discharge, oral lesions, sore throat, tinnitus or vertigo Allergy and Immunology ROS: negative for - hives or itchy/watery eyes Hematological and Lymphatic ROS: negative for - bleeding problems, bruising or swollen lymph nodes Endocrine ROS: negative for - galactorrhea, hair pattern changes, polydipsia/polyuria or temperature intolerance Respiratory ROS: negative for - cough, hemoptysis, shortness of breath or wheezing Cardiovascular ROS: negative for - chest pain, dyspnea on exertion, edema or irregular heartbeat Gastrointestinal ROS: negative for - abdominal pain, diarrhea, hematemesis, nausea/vomiting or stool incontinence Genito-Urinary ROS: negative for - dysuria, hematuria, incontinence or urinary frequency/urgency Musculoskeletal ROS: negative for - joint swelling or muscular weakness Neurological ROS: as noted in HPI Dermatological ROS: negative for rash and skin lesion changes  Exam: Current vital signs: BP (!) 159/83   Pulse 60   Temp 98.2 F (36.8 C) (Oral)   Resp 19   SpO2 96%  Vital signs in last 24 hours: Temp:  [98.2 F (36.8 C)] 98.2 F (36.8 C) (02/18 1236) Pulse Rate:  [60-78] 60 (02/18 1512) Resp:  [14-19] 19 (02/18 1512) BP: (110-159)/(74-83) 159/83 (02/18 1512) SpO2:  [95 %-98 %] 96 % (02/18 1512) Constitutional: Appears well-developed and well-nourished.  Psych: Affect appropriate to situation Eyes: No scleral injection HENT: No OP obstrucion Head: Normocephalic.  Cardiovascular: Normal rate and regular rhythm.  Respiratory: Effort normal, non-labored breathing GI: Soft.  No distension. There is no tenderness.  Skin: WDI Neuro: Mental  Status: Patient is awake, alert, oriented to person, place, month, year, and situation. Speech- naming, repeating, comprehension Patient is able to give a clear and coherent history. Cranial Nerves: II: Visual Fields are full.  III,IV, VI: EOMI without ptosis or diploplia. Pupils equal, round and reactive to light V: Facial sensation is symmetric to temperature VII: Facial movement is symmetric.  VIII: hearing is intact to voice X: Palat elevates symmetrically XI: Shoulder shrug is symmetric. XII: tongue is midline without atrophy or fasciculations.  Motor: Tone is normal. Bulk is normal. 5/5 strength was present in all four extremities.  No Drift Sensory: Sensation is symmetric to light touch and temperature in the arms and legs. No DSS Deep Tendon Reflexes: 2+ and symmetric in the biceps and patellae.  Plantars: Toes are downgoing bilaterally.  Cerebellar: FNF and HKS are intact bilaterally  Labs I have reviewed labs in epic and the results pertinent to this consultation  are:  CBC    Component Value Date/Time   WBC 4.2 05/09/2019 1316   RBC 4.60 05/09/2019 1316   HGB 15.2 05/09/2019 1316   HCT 43.4 05/09/2019 1316   PLT 114 (L) 05/09/2019 1316   MCV 94.3 05/09/2019 1316   MCH 33.0 05/09/2019 1316   MCHC 35.0 05/09/2019 1316   RDW 13.4 05/09/2019 1316   LYMPHSABS 1.1 12/17/2018 0801   MONOABS 0.3 12/17/2018 0801   EOSABS 0.1 12/17/2018 0801   BASOSABS 0.0 12/17/2018 0801    CMP     Component Value Date/Time   NA 136 05/09/2019 1316   K 3.8 05/09/2019 1316   CL 103 05/09/2019 1316   CO2 25 05/09/2019 1316   GLUCOSE 124 (H) 05/09/2019 1316   BUN 13 05/09/2019 1316   CREATININE 0.83 05/09/2019 1316   CALCIUM 9.0 05/09/2019 1316   PROT 6.1 (L) 05/09/2019 1316   ALBUMIN 3.8 05/09/2019 1316   AST 20 05/09/2019 1316   ALT 20 05/09/2019 1316   ALKPHOS 53 05/09/2019 1316   BILITOT 1.0 05/09/2019 1316   GFRNONAA >60 05/09/2019 1316   GFRAA >60 05/09/2019 1316     Lipid Panel     Component Value Date/Time   CHOL 205 (H) 12/17/2018 0801   TRIG 229.0 (H) 12/17/2018 0801   HDL 37.40 (L) 12/17/2018 0801   CHOLHDL 5 12/17/2018 0801   VLDL 45.8 (H) 12/17/2018 0801   LDLCALC 111 (H) 10/14/2009 0842   LDLDIRECT 116.0 12/17/2018 0801     Imaging I have reviewed the images obtained:  CT-scan of the brain-there is no intracranial hemorrhage, mass-effect or edema.  Gray-white matter differentiation is preserved.  No acute stroke.    Etta Quill PA-C [facility] [phone]  M-F  (9:00 am- 5:00 PM)  05/09/2019, 4:10 PM     Assessment:  This is a 86 year old male who suffered a very transient period of expressive difficulties and a alexia.  At this point patient is fully returned to baseline.  Neuro exam is baseline.  Given patient's symptoms and risk factors it was highly recommended to patient to stay and obtain stroke work-up to which she agreed to.   Impression: TIA Expressive aphasia Alexia  Recommend -MRI of the brain without contrast-may not be able to tolerate -CTA head and neck -Transthoracic Echo,   -Start patient on ASA 325mg  daily,   -Start or continue Atorvastatin 80 mg/other high intensity statin -BP goal: Less than [id number] systolic and less than 90 diastolically -HBAIC and Lipid profile -Telemetry monitoring -Frequent neuro checks -NPO until passes stroke swallow screen -PT/OT # please page stroke NP  Or  PA  Or MD from 8am -4 pm  as this patient from this time will be  followed by the stroke.   You can look them up on [email or web address]  Password [facility]   Attending addendum Patient seen and examined at the request of ED provider and admitting hospitalist for possible TIA He had a brief, less than a minute or 2 word finding difficulty episode on Tuesday. He was admitted for TIA work-up and neurology was consulted. ABCD 2 score is 2. TIA work-up as above although not convinced that this was a TIA. I have  personally reviewed imaging  -- Amie Portland, MD [facility] Pager: [phone] If 7pm to 7am, please call on call as listed on AMION.

## 2019-05-09 NOTE — ED Provider Notes (Signed)
[place] [facility] EMERGENCY DEPARTMENT Provider Note   CSN: [id number] Arrival date & time: 05/09/19  1231     History Chief Complaint  Patient presents with  . Stroke Symptoms    Larry [name] is a 86 y.o. male.  HPI   86 year old male with a history of GERD, hyper triglyceridemia, hyperlipidemia, who presents to the emergency department today for evaluation of strokelike symptoms.  Patient states that around 8:00 -9:00 PM yesterday he was trying to read a grocery list and he could not understand the words that he was reading.  Following this he also had some trouble speaking.  The symptoms resolved quickly and he states that he was back to normal by around 1 AM.  He did not think much of the symptoms but his family members encouraged him to be seen in the ED due to concern for possible CVA.  Patient denies any symptoms currently including no headaches, visual changes, speech difficulty, facial droop, numbness/weakness.  Denies any dizziness, lightheadedness or difficulty with ambulation.  He has been able to read and speak normally this morning.  He notes that he got his second Covid shot about 5 days ago and wonders if his symptoms could be related to this.   Past Medical History:  Diagnosis Date  . 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 Esophageal reflux   . Family history of diabetes mellitus   . History of chickenpox   . Hypertriglyceridemia   . Other and unspecified hyperlipidemia   . Pre-syncope   . Thrombocytopenia, unspecified ([place])   . Unspecified hypothyroidism     Patient Active Problem List   Diagnosis Date Noted  . Medicare annual wellness visit, subsequent 12/23/2018  . Allergy to alpha-gal 12/18/2017  . Joint pain 03/01/2017  . 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 Near syncope 09/24/2014  . Advance care planning 10/03/2013  . Leg swelling 09/18/2013  . Lower GI bleed 03/07/2013  . Rectal bleeding 03/07/2013  . Ischemic colitis ([place])  03/07/2013  . Chronic dermatitis 08/28/2012  . Shoulder pain 01/25/2012  . Situational anxiety 01/25/2012  . Healthcare maintenance 08/15/2011  . Vertigo 08/15/2011  . BPH (benign prostatic hyperplasia) 08/15/2011  . RHINITIS 05/11/2010  . HEMORRHOIDS 10/22/2009  . Hypothyroidism 09/28/2009  . LOW BACK PAIN 09/28/2009  . HLD (hyperlipidemia) 09/25/2009  . Thrombocytopenia ([place]) 09/25/2009  . GERD 09/25/2009  . HEADACHE 09/25/2009    Past Surgical History:  Procedure Laterality Date  . APPENDECTOMY  1949       Family History  Problem Relation Age of Onset  . Stroke Mother   . Diabetes Mother        borderline  . Pneumonia Father   . Hypertension Sister   . Hyperlipidemia Sister   . Alzheimer's disease Brother   . Dementia Brother   . Liver disease Sister        cirrhosis, nondrinker  . Arthritis Other   . Diabetes Other        1st degree relative  . Hyperlipidemia Other   . Hypertension Other   . Prostate cancer Neg Hx   . Colon cancer Neg Hx     Social History   Tobacco Use  . Smoking status: Never Smoker  . Smokeless tobacco: Never Used  . Tobacco comment: smoked only as a teenage small amount  Substance Use Topics  . Alcohol use: No  . Drug use: No    Home Medications Prior to Admission medications  Medication Sig Start Date End Date Taking? Authorizing Provider  ALPRAZolam Duanne Moron) 0.25 MG tablet Use 1/2 tab twice daily as needed. 12/20/18   Tonia Ghent, MD  calcium carbonate (TUMS - DOSED IN MG ELEMENTAL CALCIUM) 500 MG chewable tablet Chew 1 tablet by mouth daily as needed for indigestion or heartburn.    [provider]  Coenzyme Q10 (CO Q 10 PO) Take 1 capsule by mouth daily.    [provider]  diclofenac sodium (VOLTAREN) 1 % GEL Apply 2 g topically 4 (four) times daily as needed. 02/15/17   Tonia Ghent, MD  EPINEPHrine 0.3 mg/0.3 mL IJ SOAJ injection INJECT 0.3 MLS (1 PEN) INTO THE MUSCLE ONCE. AS NEEDED 02/14/18    Tonia Ghent, MD  folic acid (FOLVITE) 1 MG tablet Take 1 mg by mouth daily.    [provider]  KRILL OIL PO Take 1 tablet by mouth daily.    [provider]  levothyroxine (SYNTHROID) 50 MCG tablet Take 1 tablet (50 mcg total) by mouth daily before breakfast. Okay to dispense generic 01/04/19   Tonia Ghent, MD  methotrexate (RHEUMATREX) 2.5 MG tablet Take 2 tablets (5 mg total) by mouth once a week. Caution:Chemotherapy. Protect from light. 12/18/17   Tonia Ghent, MD  Multiple Vitamins-Minerals (MULTIVITAMIN WITH MINERALS) tablet Take 1 tablet by mouth daily.    [provider]  Omega-3 Fatty Acids (FISH OIL PO) Take 1 tablet by mouth daily.    [provider]  Red Yeast Rice Extract (RED YEAST RICE PO) Take 1 tablet by mouth daily.    [provider]    Allergies    Alpha-gal; Antihistamines, diphenhydramine-type; Atorvastatin; Beef-derived products; Ezetimibe; Other; Pork-derived products; Simvastatin; Sulfonamide derivatives; and Tramadol  Review of Systems   Review of Systems  Constitutional: Negative for fever.  HENT: Negative for ear pain and sore throat.   Eyes: Negative for visual disturbance.  Respiratory: Negative for cough and shortness of breath.   Cardiovascular: Negative for chest pain.  Gastrointestinal: Negative for abdominal pain, nausea and vomiting.  Genitourinary: Negative for dysuria and hematuria.  Musculoskeletal: Negative for back pain and neck pain.  Skin: Negative for rash.  Neurological: Negative for dizziness, weakness, light-headedness, numbness and headaches.       Receptive and expressive aphasia (resolved)  All other systems reviewed and are negative.   Physical Exam Updated Vital Signs BP 140/74   Pulse 67   Temp 98.2 F (36.8 C) (Oral)   Resp 17   SpO2 95%   Physical Exam Vitals and nursing note reviewed.  Constitutional:      Appearance: He is well-developed.  HENT:     Head:  Normocephalic and atraumatic.  Eyes:     Conjunctiva/sclera: Conjunctivae normal.  Cardiovascular:     Rate and Rhythm: Normal rate and regular rhythm.     Pulses: Normal pulses.     Heart sounds: Normal heart sounds. No murmur.  Pulmonary:     Effort: Pulmonary effort is normal. No respiratory distress.     Breath sounds: Normal breath sounds. No wheezing, rhonchi or rales.  Abdominal:     General: Bowel sounds are normal.     Palpations: Abdomen is soft.     Tenderness: There is no abdominal tenderness. There is no guarding or rebound.  Musculoskeletal:     Cervical back: Neck supple.  Skin:    General: Skin is warm and dry.  Neurological:     Mental  Status: He is alert.     Comments: Mental Status:  Alert, thought content appropriate, able to give a coherent history. Speech fluent without evidence of aphasia. Able to follow 2 step commands without difficulty.  Cranial Nerves:  II:  pupils equal, round, reactive to light III,IV, VI: ptosis not present, extra-ocular motions intact bilaterally  V,VII: smile symmetric, facial light touch sensation equal VIII: hearing grossly normal to voice  X: uvula elevates symmetrically  XI: bilateral shoulder shrug symmetric and strong XII: midline tongue extension without fassiculations Motor:  Normal tone. 5/5 strength of BUE and BLE major muscle groups including strong and equal grip strength and dorsiflexion/plantar flexion Sensory: light touch normal in all extremities. Cerebellar: normal finger-to-nose with bilateral upper extremities, normal heel to shin Gait: normal gait and balance.      ED Results / Procedures / Treatments   Labs (all labs ordered are listed, but only abnormal results are displayed) Labs Reviewed  CBC - Abnormal; Notable for the following components:      Result Value   Platelets 114 (*)    All other components within normal limits  COMPREHENSIVE METABOLIC PANEL - Abnormal; Notable for the following  components:   Glucose, Bld 124 (*)    Total Protein 6.1 (*)    All other components within normal limits  URINALYSIS, ROUTINE W REFLEX MICROSCOPIC    EKG None  Date: 05/09/2019  Rate: 79  Rhythm: normal sinus rhythm  QRS Axis: normal  Intervals: normal  ST/T Wave abnormalities: normal  Conduction Disutrbances: none  Narrative Interpretation:      Radiology CT HEAD WO CONTRAST  Result Date: 05/09/2019 CLINICAL DATA:  Speech difficulty, now resolved EXAM: CT HEAD WITHOUT CONTRAST TECHNIQUE: Contiguous axial images were obtained from the base of the skull through the vertex without intravenous contrast. COMPARISON:  2016 FINDINGS: Brain: There is no acute intracranial hemorrhage, mass-effect, or edema. Gray-white differentiation is preserved. There is no extra-axial fluid collection. Patchy hypoattenuation in the supratentorial white matter is nonspecific but probably reflects similar mild chronic microvascular ischemic changes. Ventricles and sulci are within normal limits in size and configuration. Vascular: There is atherosclerotic calcification at the skull base. Skull: Calvarium is unremarkable. Sinuses/Orbits: Retention cyst or polyp of the right maxillary sinus. Orbits are unremarkable. Other: Mastoid air cells are clear. IMPRESSION: No acute intracranial hemorrhage, mass effect, or evidence of acute infarction. Electronically Signed   By: Macy Mis M.D.   On: 05/09/2019 13:46    Procedures Procedures (including critical care time)  Medications Ordered in ED Medications  LORazepam (ATIVAN) injection 1 mg (has no administration in time range)    ED Course  I have reviewed the triage vital signs and the nursing notes.  Pertinent labs & imaging results that were available during my care of the patient were reviewed by me and considered in my medical decision making (see chart for details).    MDM Rules/Calculators/A&P                      86 year old male presenting  for evaluation of strokelike symptoms that occurred yesterday.  Patient had an episode of expressive and receptive aphasia that started around 8-9 PM and resolved completely after few hours.  He feels completely back to baseline at this time.  He has some hypertension today but otherwise his vital signs are reassuring.  He does not have any focal neurologic deficits on exam today.  Reviewed labs CBC nonacute CMP nonacute UA pending  on admission   EKG wnl  CT head neg for acute intracranial abnormality   Presentation consistent with TIA. Will consult neuro.   2:02 PM CONSULT With Dr. Leonel Ramsay who recommends MRI and admission for TIA w/u.   3:16 PM CONSULT With Dr. Lorin Mercy with hospitalist service who accepts patient for admission.   Final Clinical Impression(s) / ED Diagnoses Final diagnoses:  TIA (transient ischemic attack)    Rx / DC Orders ED Discharge Orders    None       Bishop Dublin 05/09/19 1518    Virgel Manifold, MD 05/10/19 [phone]

## 2019-05-09 NOTE — ED Notes (Signed)
Patient at CT scan.

## 2019-05-09 NOTE — ED Triage Notes (Signed)
Pt states he had trouble speaking yesterday at 9pm. Pt had trouble reading the grocery list per his wife. No weakness in arms or legs. Pt felt betters/symptoms resolved at 11pm yesterday. Pt had 2nd covid shot on Saturday. Pt has no symptoms today.

## 2019-05-09 NOTE — ED Notes (Signed)
Attempted to pre-medicate patient for MRI. Patient refused, stated he was told he does not have to if he is too claustrophobic. Dr. Rory Percy notifed aware.

## 2019-05-10 ENCOUNTER — Observation Stay (HOSPITAL_BASED_OUTPATIENT_CLINIC_OR_DEPARTMENT_OTHER): Payer: Medicare Other

## 2019-05-10 DIAGNOSIS — D696 Thrombocytopenia, unspecified: Secondary | ICD-10-CM

## 2019-05-10 DIAGNOSIS — K219 Gastro-esophageal reflux disease without esophagitis: Secondary | ICD-10-CM | POA: Diagnosis not present

## 2019-05-10 DIAGNOSIS — G459 Transient cerebral ischemic attack, unspecified: Secondary | ICD-10-CM

## 2019-05-10 DIAGNOSIS — R4701 Aphasia: Secondary | ICD-10-CM

## 2019-05-10 DIAGNOSIS — E785 Hyperlipidemia, unspecified: Secondary | ICD-10-CM | POA: Diagnosis not present

## 2019-05-10 DIAGNOSIS — E039 Hypothyroidism, unspecified: Secondary | ICD-10-CM

## 2019-05-10 LAB — ECHOCARDIOGRAM COMPLETE
Height: 64 in
Weight: 2366.86 oz

## 2019-05-10 LAB — LIPID PANEL
Cholesterol: 223 mg/dL — ABNORMAL HIGH (ref 0–200)
HDL: 38 mg/dL — ABNORMAL LOW (ref >40)
LDL Cholesterol: 150 mg/dL — ABNORMAL HIGH (ref 0–99)
Total CHOL/HDL Ratio: 5.9 RATIO
Triglycerides: 175 mg/dL — ABNORMAL HIGH (ref <150)
VLDL: 35 mg/dL (ref 0–40)

## 2019-05-10 LAB — HEMOGLOBIN A1C
Hgb A1c MFr Bld: 5.5 % (ref 4.8–5.6)
Mean Plasma Glucose: 111.15 mg/dL

## 2019-05-10 LAB — SARS CORONAVIRUS 2 (TAT 6-24 HRS): SARS Coronavirus 2: NEGATIVE

## 2019-05-10 MED ORDER — PANTOPRAZOLE SODIUM 40 MG PO TBEC
40.0000 mg | DELAYED_RELEASE_TABLET | Freq: Every day | ORAL | Status: DC
  Administered 2019-05-10: 08:00:00 40 mg via ORAL
  Filled 2019-05-10: qty 1

## 2019-05-10 MED ORDER — FOLIC ACID 1 MG PO TABS
1.0000 mg | ORAL_TABLET | Freq: Every day | ORAL | Status: DC
  Administered 2019-05-10: 08:00:00 1 mg via ORAL
  Filled 2019-05-10: qty 1

## 2019-05-10 MED ORDER — OMEGA-3-ACID ETHYL ESTERS 1 G PO CAPS
1.0000 g | ORAL_CAPSULE | Freq: Every day | ORAL | Status: DC
  Administered 2019-05-10: 08:00:00 1 g via ORAL
  Filled 2019-05-10: qty 1

## 2019-05-10 MED ORDER — DICLOFENAC SODIUM 1 % TD GEL: 2.0000 g | Freq: Four times a day (QID) | TRANSDERMAL | Status: DC | PRN

## 2019-05-10 MED ORDER — LEVOTHYROXINE SODIUM 50 MCG PO TABS
50.0000 ug | ORAL_TABLET | Freq: Every day | ORAL | Status: DC
  Administered 2019-05-10: 07:00:00 50 ug via ORAL
  Filled 2019-05-10: qty 1

## 2019-05-10 MED ORDER — ALPRAZOLAM 0.25 MG PO TABS: 0.1250 mg | ORAL_TABLET | Freq: Two times a day (BID) | ORAL | Status: DC | PRN

## 2019-05-10 MED ORDER — ASPIRIN 325 MG PO TABS: 325.0000 mg | ORAL_TABLET | Freq: Every day | ORAL | Status: DC

## 2019-05-10 MED ORDER — LEVOTHYROXINE SODIUM 50 MCG PO TABS: 50.0000 ug | ORAL_TABLET | Freq: Every day | ORAL | Status: DC

## 2019-05-10 NOTE — Discharge Summary (Signed)
Physician Discharge Summary  Larry [name] [id number] DOB: 1929-11-10 DOA: 05/09/2019  PCP: Tonia Ghent, MD  Admit date: 05/09/2019 Discharge date: 05/10/2019  Time spent: 50 minutes  Recommendations for Outpatient Follow-up:  1. Follow-up with Dr. Leonie Man, neurology in 4 weeks. 2. Follow-up with Tonia Ghent, MD in 2 weeks.   Discharge Diagnoses:  Principal Problem:   TIA (transient ischemic attack) Active Problems:   Expressive aphasia   Hypothyroidism   HLD (hyperlipidemia)   Thrombocytopenia (HCC)   GERD   Discharge Condition: Stable and improved  Diet recommendation: Heart healthy  Filed Weights   05/10/19 0000  Weight: 67.1 kg    History of present illness:  HPI per Dr. Lorin Mercy 05/09/2019 Larry [name] is a 86 y.o. male with medical history significant of hypothyroidism; thrombocytopenia; and HLD presented with transient aphasia.  He had his 2nd COVID shot recently.  He was looking at his grocery list and couldn't talk night before last.  He made sounds but couldn't say words.  This lasted for <1 minute.  They called his daughter and she came over and his symptoms went away.  They eventually said he should come in for evaluation even though he hasn't had more problems.  No dysphagia or n/w/t otherwise.  He had had a hard day and was very tired, and he isn't sure it didn't have anything to do with the COVID shot he had received 5 days prior.  He reported that he can not have a closed MRI and would like to go home and have that done as an outpatient.  He was going to be sent for an open MRI by his PCP but the weather meant that couldn't happen and so he was sent to the ER instead.   ED Course:  Likely TIA - last night, expressive and receptive aphasia, resolved after a few hours.  Feels fine now.  Neuro recommends observation and they will consult.  Hospital Course:  1 TIA versus expressive aphasia Patient had presented with an expressive and receptive  aphasia which lasted a few seconds.  Patient with no history of CVA and presented with a transient episode of aphasia about 36 to 48 hours prior to presentation and deemed not a TPA candidate.  Patient symptoms resolved and was back to his baseline.  Patient was admitted under observation for CVA/TIA work-up.  Patient placed on full dose aspirin and neurology consulted.  Patient underwent a neck that showed no LVO, minimal atherosclerosis of the head neck without any significant stenosis, some ectasia noted bilateral ICA at the cervical level.  CT head was negative.  MRI ordered but patient unable to tolerate due to claustrophobia and was subsequently canceled per neurology.  2D echo was done with a EF of 55 to 60%, no wall motion abnormalities, grade 1 diastolic dysfunction, no source of emboli noted.  Patient was seen by PT OT and ST and had no recommendations.  Fasting lipid panel was obtained and patient noted to have a LDL of 150 however patient with significant intolerance to statins with different myalgias and as such did not want to be placed on a statin.  Patient noted to be on omega-3 as well as red rice yeast at home.  Patient was stable throughout the hospitalization and was discharged home in stable and improved condition on full dose aspirin and will need outpatient follow-up with neurology in 4 weeks.  2.  Hypothyroidism Patient maintained on home regimen Synthroid.  3.  Hyperlipidemia Patient noted  to have a intolerance to statins in the past with multiple statins with myalgias and did not want to be placed on a statin.  LDL obtained was 150.  Patient to be discharged back on home regimen of omega-3 and red rice yeast.  Outpatient follow-up with PCP.  4.  Chronic thrombocytopenia Remained stable.  No signs of bleeding noted.  Outpatient follow-up.  5.  Chronic dermatitis Outpatient follow-up.  Methotrexate will be resumed on discharge.  6.  Gastroesophageal reflux disease Patient  maintained on a PPI during the hospitalization.  Outpatient follow-up.  Procedures:  2D echo 05/10/2019--- results pending at discharge  CT angiogram head and neck 05/09/2019  CT head 05/09/2019    Consultations:  Neurology: Dr. Rory Percy 05/09/2019  Discharge Exam: Vitals:   05/10/19 0843 05/10/19 1224  BP: (!) 144/73 (!) 145/75  Pulse: 70 66  Resp: 16 (!) 24  Temp: 97.9 F (36.6 C) 98.2 F (36.8 C)  SpO2: 98% 98%    General: NAD Cardiovascular: RRR Respiratory: CTAB  Discharge Instructions   Discharge Instructions    Diet - low sodium heart healthy   Complete by: As directed    Increase activity slowly   Complete by: As directed      Allergies as of 05/10/2019      Reactions   Alpha-gal Other (See Comments)   Antihistamines, Diphenhydramine-type    Urinary retention   Atorvastatin    REACTION: Intolerance   Beef-derived Products    Tolerates chicken/fish/turkey   Ezetimibe    REACTION: Intolerance   Other    ALPHAGAL- see above re: beef allergy   Pork-derived Products Other (See Comments)   Due to alpha gal hx   Simvastatin    REACTION: Intolerance   Sulfonamide Derivatives    REACTION: hives, itching   Tramadol Other (See Comments)   Lightheaded      Medication List    TAKE these medications   ALPRAZolam 0.25 MG tablet Commonly known as: XANAX Use 1/2 tab twice daily as needed. What changed:   how much to take  how to take this  when to take this  reasons to take this  additional instructions   aspirin 325 MG tablet Take 1 tablet (325 mg total) by mouth daily. Start taking on: May 11, 2019   calcium carbonate 500 MG chewable tablet Commonly known as: TUMS - dosed in mg elemental calcium Chew 1 tablet by mouth daily as needed for indigestion or heartburn.   CO Q 10 PO Take 1 capsule by mouth daily.   diclofenac sodium 1 % Gel Commonly known as: VOLTAREN Apply 2 g topically 4 (four) times daily as needed. What changed:  reasons to take this   EPINEPHrine 0.3 mg/0.3 mL Soaj injection Commonly known as: EPI-PEN INJECT 0.3 MLS (1 PEN) INTO THE MUSCLE ONCE. AS NEEDED What changed:   how much to take  how to take this  when to take this  reasons to take this  additional instructions   FISH OIL PO Take 1 tablet by mouth daily.   folic acid 1 MG tablet Commonly known as: FOLVITE Take 1 mg by mouth daily.   KRILL OIL PO Take 1 tablet by mouth daily.   levothyroxine 50 MCG tablet Commonly known as: Synthroid Take 1 tablet (50 mcg total) by mouth daily before breakfast. Okay to dispense generic   methotrexate 2.5 MG tablet Commonly known as: RHEUMATREX Take 2 tablets (5 mg total) by mouth once a week. Caution:Chemotherapy. Protect  from light. What changed: additional instructions   RED YEAST RICE PO Take 1 tablet by mouth daily.      Allergies  Allergen Reactions  . Alpha-Gal Other (See Comments)  . Antihistamines, Diphenhydramine-Type     Urinary retention  . Atorvastatin     REACTION: Intolerance  . Beef-Derived Products     Tolerates chicken/fish/turkey  . Ezetimibe     REACTION: Intolerance  . Other     ALPHAGAL- see above re: beef allergy  . Pork-Derived Products Other (See Comments)    Due to alpha gal hx  . Simvastatin     REACTION: Intolerance  . Sulfonamide Derivatives     REACTION: hives, itching  . Tramadol Other (See Comments)    Lightheaded   Follow-up Information    Tonia Ghent, MD. Schedule an appointment as soon as possible for a visit in 2 week(s).   Specialty: Family Medicine Contact information: [place] [address] [phone]        Garvin Fila, MD. Schedule an appointment as soon as possible for a visit in 4 week(s).   Specialties: Neurology, Radiology Contact information: [address] [place] [address] [phone]            The results of significant diagnostics from this  hospitalization (including imaging, microbiology, ancillary and laboratory) are listed below for reference.    Significant Diagnostic Studies: CT ANGIO HEAD W OR WO CONTRAST  Result Date: 05/09/2019 CLINICAL DATA:  86 year old male with transient abnormal speech. Symptoms improved/resolved since yesterday. EXAM: CT ANGIOGRAPHY HEAD AND NECK TECHNIQUE: Multidetector CT imaging of the head and neck was performed using the standard protocol during bolus administration of intravenous contrast. Multiplanar CT image reconstructions and MIPs were obtained to evaluate the vascular anatomy. Carotid stenosis measurements (when applicable) are obtained utilizing NASCET criteria, using the distal internal carotid diameter as the denominator. CONTRAST:  39mL OMNIPAQUE IOHEXOL 350 MG/ML SOLN COMPARISON:  Head CT 1332 hours today. FINDINGS: CTA NECK Skeleton: No acute osseous abnormality identified. Intermittent advanced cervical spine facet arthropathy. Upper chest: Negative. Other neck: No acute neck soft tissue findings. Aortic arch: 3 vessel arch configuration with mild for age soft and calcified arch atherosclerosis. Right carotid system: No brachiocephalic artery or right CCA origin stenosis. Negative right carotid bifurcation. No cervical right ICA stenosis. There is tortuosity of the vessel, and also a beaded and mildly ectatic segment at C1-C2 best seen on series 9, image 74. Left carotid system: Minor plaque at the left CCA origin without stenosis. Negative left carotid bifurcation. Similar tortuosity and ectasia of the cervical left ICA with a mildly beaded appearance (series 12, image 27. No stenosis. Vertebral arteries: Mild plaque in the proximal right subclavian artery without stenosis. Normal right vertebral artery origin. Patent right vertebral artery to the skull base without plaque or stenosis. Mild plaque in the proximal left subclavian artery without stenosis. Normal left vertebral artery origin. Tortuous  left V1 segment. Patent left vertebral artery to the skull base without plaque or stenosis. CTA HEAD Posterior circulation: The distal right vertebral artery is mildly dominant. No distal vertebral plaque or stenosis. Normal left PICA origin. The right AICA may be dominant. Patent vertebrobasilar junction and basilar artery with mild ectasia and no stenosis. Normal SCA and PCA origins. Posterior communicating arteries are diminutive or absent. Bilateral PCA branches are within normal limits. Anterior circulation: Both ICA siphons are patent. On the left there is minimal calcified plaque with  no stenosis. Similar minimal to mild right siphon calcified plaque without stenosis. Normal ophthalmic artery origins. Patent carotid termini. Patent MCA and ACA origins. Dominant left ACA A1 segment, the right A1 is diminutive. Anterior communicating artery and bilateral ACA branches are within normal limits. Left MCA M1 segment is mildly tortuous. Patent left MCA bifurcation without stenosis. Left MCA branches are within normal limits. Right MCA M1 segment bifurcates early and the bifurcation is ectatic but without stenosis. Right MCA branches are within normal limits. Venous sinuses: Early contrast timing, not well evaluated. Anatomic variants: Dominant left ACA A1 segment. Mildly dominant distal right vertebral artery. Review of the MIP images confirms the above findings IMPRESSION: 1. Negative for large vessel occlusion. 2. Some generalized arterial ectasia and evidence of bilateral cervical ICA Fibromuscular Dysplasia (FMD). 3. But minimal atherosclerosis in the head and neck, no arterial stenosis identified. 4. Mild for age aortic arch atherosclerosis. Electronically Signed   By: Genevie Ann M.D.   On: 05/09/2019 19:17   CT HEAD WO CONTRAST  Result Date: 05/09/2019 CLINICAL DATA:  Speech difficulty, now resolved EXAM: CT HEAD WITHOUT CONTRAST TECHNIQUE: Contiguous axial images were obtained from the base of the skull  through the vertex without intravenous contrast. COMPARISON:  2016 FINDINGS: Brain: There is no acute intracranial hemorrhage, mass-effect, or edema. Gray-white differentiation is preserved. There is no extra-axial fluid collection. Patchy hypoattenuation in the supratentorial white matter is nonspecific but probably reflects similar mild chronic microvascular ischemic changes. Ventricles and sulci are within normal limits in size and configuration. Vascular: There is atherosclerotic calcification at the skull base. Skull: Calvarium is unremarkable. Sinuses/Orbits: Retention cyst or polyp of the right maxillary sinus. Orbits are unremarkable. Other: Mastoid air cells are clear. IMPRESSION: No acute intracranial hemorrhage, mass effect, or evidence of acute infarction. Electronically Signed   By: Macy Mis M.D.   On: 05/09/2019 13:46   CT ANGIO NECK W OR WO CONTRAST  Result Date: 05/09/2019 CLINICAL DATA:  86 year old male with transient abnormal speech. Symptoms improved/resolved since yesterday. EXAM: CT ANGIOGRAPHY HEAD AND NECK TECHNIQUE: Multidetector CT imaging of the head and neck was performed using the standard protocol during bolus administration of intravenous contrast. Multiplanar CT image reconstructions and MIPs were obtained to evaluate the vascular anatomy. Carotid stenosis measurements (when applicable) are obtained utilizing NASCET criteria, using the distal internal carotid diameter as the denominator. CONTRAST:  12mL OMNIPAQUE IOHEXOL 350 MG/ML SOLN COMPARISON:  Head CT 1332 hours today. FINDINGS: CTA NECK Skeleton: No acute osseous abnormality identified. Intermittent advanced cervical spine facet arthropathy. Upper chest: Negative. Other neck: No acute neck soft tissue findings. Aortic arch: 3 vessel arch configuration with mild for age soft and calcified arch atherosclerosis. Right carotid system: No brachiocephalic artery or right CCA origin stenosis. Negative right carotid  bifurcation. No cervical right ICA stenosis. There is tortuosity of the vessel, and also a beaded and mildly ectatic segment at C1-C2 best seen on series 9, image 74. Left carotid system: Minor plaque at the left CCA origin without stenosis. Negative left carotid bifurcation. Similar tortuosity and ectasia of the cervical left ICA with a mildly beaded appearance (series 12, image 27. No stenosis. Vertebral arteries: Mild plaque in the proximal right subclavian artery without stenosis. Normal right vertebral artery origin. Patent right vertebral artery to the skull base without plaque or stenosis. Mild plaque in the proximal left subclavian artery without stenosis. Normal left vertebral artery origin. Tortuous left V1 segment. Patent left vertebral artery to the skull base  without plaque or stenosis. CTA HEAD Posterior circulation: The distal right vertebral artery is mildly dominant. No distal vertebral plaque or stenosis. Normal left PICA origin. The right AICA may be dominant. Patent vertebrobasilar junction and basilar artery with mild ectasia and no stenosis. Normal SCA and PCA origins. Posterior communicating arteries are diminutive or absent. Bilateral PCA branches are within normal limits. Anterior circulation: Both ICA siphons are patent. On the left there is minimal calcified plaque with no stenosis. Similar minimal to mild right siphon calcified plaque without stenosis. Normal ophthalmic artery origins. Patent carotid termini. Patent MCA and ACA origins. Dominant left ACA A1 segment, the right A1 is diminutive. Anterior communicating artery and bilateral ACA branches are within normal limits. Left MCA M1 segment is mildly tortuous. Patent left MCA bifurcation without stenosis. Left MCA branches are within normal limits. Right MCA M1 segment bifurcates early and the bifurcation is ectatic but without stenosis. Right MCA branches are within normal limits. Venous sinuses: Early contrast timing, not well  evaluated. Anatomic variants: Dominant left ACA A1 segment. Mildly dominant distal right vertebral artery. Review of the MIP images confirms the above findings IMPRESSION: 1. Negative for large vessel occlusion. 2. Some generalized arterial ectasia and evidence of bilateral cervical ICA Fibromuscular Dysplasia (FMD). 3. But minimal atherosclerosis in the head and neck, no arterial stenosis identified. 4. Mild for age aortic arch atherosclerosis. Electronically Signed   By: Genevie Ann M.D.   On: 05/09/2019 19:17   ECHOCARDIOGRAM COMPLETE  Result Date: 05/10/2019    ECHOCARDIOGRAM REPORT   Patient Name:   RHODERICK KORMOS Date of Exam: 05/10/2019 Medical Rec #:  [id number]        Height:       64.0 in Accession #:    [id number]       Weight:       147.9 lb Date of Birth:  1929-09-02         BSA:          1.72 m Patient Age:    62 years         BP:           144/73 mmHg Patient Gender: M                HR:           70 bpm. Exam Location:  Inpatient Procedure: 2D Echo Indications:    TIA 435.9 / G45.9  History:        Patient has no prior history of Echocardiogram examinations.                 Risk Factors:Dyslipidemia. Hypothyroidism, thrombocytopenia.  Sonographer:    Darlina Sicilian RDCS Referring Phys: Welda  1. Left ventricular ejection fraction, by estimation, is 55 to 60%. The left ventricle has normal function. The left ventricle has no regional wall motion abnormalities. Left ventricular diastolic parameters are consistent with Grade I diastolic dysfunction (impaired relaxation).  2. Right ventricular systolic function is normal. The right ventricular size is mildly enlarged. Tricuspid regurgitation signal is inadequate for assessing PA pressure.  3. The mitral valve is grossly normal. No evidence of mitral valve regurgitation. No evidence of mitral stenosis.  4. The aortic valve is grossly normal. Aortic valve regurgitation is not visualized. No aortic stenosis is present.  5. The  inferior vena cava is normal in size with greater than 50% respiratory variability, suggesting right atrial pressure of 3 mmHg. Comparison(s): No prior Echocardiogram. Conclusion(s)/Recommendation(s): No  intracardiac source of embolism detected on this transthoracic study. A transesophageal echocardiogram is recommended to exclude cardiac source of embolism if clinically indicated. FINDINGS  Left Ventricle: Left ventricular ejection fraction, by estimation, is 55 to 60%. The left ventricle has normal function. The left ventricle has no regional wall motion abnormalities. The left ventricular internal cavity size was normal in size. There is  no left ventricular hypertrophy. Left ventricular diastolic parameters are consistent with Grade I diastolic dysfunction (impaired relaxation). Right Ventricle: The right ventricular size is mildly enlarged. No increase in right ventricular wall thickness. Right ventricular systolic function is normal. Tricuspid regurgitation signal is inadequate for assessing PA pressure. Left Atrium: Left atrial size was normal in size. Right Atrium: Right atrial size was normal in size. Pericardium: There is no evidence of pericardial effusion. Mitral Valve: The mitral valve is grossly normal. No evidence of mitral valve regurgitation. No evidence of mitral valve stenosis. Tricuspid Valve: The tricuspid valve is grossly normal. Tricuspid valve regurgitation is trivial. Aortic Valve: The aortic valve is grossly normal. Aortic valve regurgitation is not visualized. No aortic stenosis is present. Pulmonic Valve: The pulmonic valve was grossly normal. Pulmonic valve regurgitation is not visualized. Aorta: The aortic root is normal in size and structure. Venous: The inferior vena cava is normal in size with greater than 50% respiratory variability, suggesting right atrial pressure of 3 mmHg. IAS/Shunts: No atrial level shunt detected by color flow Doppler.  LEFT VENTRICLE PLAX 2D LVIDd:          4.50 cm  Diastology LVIDs:         2.90 cm  LV e' lateral:   7.72 cm/s LV PW:         0.90 cm  LV E/e' lateral: 7.7 LV IVS:        0.90 cm  LV e' medial:    6.74 cm/s LVOT diam:     2.00 cm  LV E/e' medial:  8.8 LV SV:         42.41 ml LV SV Index:   34.35 LVOT Area:     3.14 cm  RIGHT VENTRICLE RV S prime:     13.80 cm/s TAPSE (M-mode): 2.1 cm LEFT ATRIUM           Index LA diam:      3.40 cm 1.98 cm/m LA Vol (A4C): 42.8 ml 24.87 ml/m  AORTIC VALVE LVOT Vmax:   65.90 cm/s LVOT Vmean:  40.900 cm/s LVOT VTI:    0.135 m  AORTA Ao Root diam: 3.40 cm MITRAL VALVE MV Area (PHT): 2.32 cm    SHUNTS MV Decel Time: 327 msec    Systemic VTI:  0.14 m MV E velocity: 59.10 cm/s  Systemic Diam: 2.00 cm MV A velocity: 71.60 cm/s MV E/A ratio:  0.83 Eleonore Chiquito MD Electronically signed by Eleonore Chiquito MD Signature Date/Time: 05/10/2019/10:36:03 AM    Final     Microbiology: Recent Results (from the past 240 hour(s))  SARS CORONAVIRUS 2 (TAT 6-24 HRS) Nasopharyngeal Nasopharyngeal Swab     Status: None   Collection Time: 05/09/19  7:40 PM   Specimen: Nasopharyngeal Swab  Result Value Ref Range Status   SARS Coronavirus 2 NEGATIVE NEGATIVE Final    Comment: (NOTE) SARS-CoV-2 target nucleic acids are NOT DETECTED. The SARS-CoV-2 RNA is generally detectable in upper and lower respiratory specimens during the acute phase of infection. Negative results do not preclude SARS-CoV-2 infection, do not rule out co-infections with other pathogens, and should  not be used as the sole basis for treatment or other patient management decisions. Negative results must be combined with clinical observations, patient history, and epidemiological information. The expected result is Negative. Fact Sheet for Patients: SugarRoll.be Fact Sheet for Healthcare Providers: https://www.woods-mathews.com/ This test is not yet approved or cleared by the [place] FDA and  has been authorized  for detection and/or diagnosis of SARS-CoV-2 by FDA under an Emergency Use Authorization (EUA). This EUA will remain  in effect (meaning this test can be used) for the duration of the COVID-19 declaration under Section 56 4(b)(1) of the Act, 21 U.S.C. section 360bbb-3(b)(1), unless the authorization is terminated or revoked sooner. Performed at [facility], [place] [address] [place], [place] [address]      Labs: Basic Metabolic Panel: Recent Labs  Lab 05/09/19 1316  NA 136  K 3.8  CL 103  CO2 25  GLUCOSE 124*  BUN 13  CREATININE 0.83  CALCIUM 9.0   Liver Function Tests: Recent Labs  Lab 05/09/19 1316  AST 20  ALT 20  ALKPHOS 53  BILITOT 1.0  PROT 6.1*  ALBUMIN 3.8   No results for input(s): LIPASE, AMYLASE in the last 168 hours. No results for input(s): AMMONIA in the last 168 hours. CBC: Recent Labs  Lab 05/09/19 1316  WBC 4.2  HGB 15.2  HCT 43.4  MCV 94.3  PLT 114*   Cardiac Enzymes: No results for input(s): CKTOTAL, CKMB, CKMBINDEX, TROPONINI in the last 168 hours. BNP: BNP (last 3 results) No results for input(s): BNP in the last 8760 hours.  ProBNP (last 3 results) No results for input(s): PROBNP in the last 8760 hours.  CBG: No results for input(s): GLUCAP in the last 168 hours.     Signed:  Irine Seal MD.  [facility] 05/10/2019, 2:41 PM

## 2019-05-10 NOTE — Progress Notes (Signed)
PT Cancellation Note  Patient Details Name: Larry [name] MRN: [id number] DOB: 01-26-1930   Cancelled Treatment:    Reason Eval/Treat Not Completed: PT screened, no needs identified, will sign off     Wyona Almas, PT, DPT Acute Rehabilitation Services Pager [phone] Office [phone]    Deno Etienne 05/10/2019, 9:39 AM

## 2019-05-10 NOTE — Progress Notes (Addendum)
STROKE TEAM PROGRESS NOTE   INTERVAL HISTORY No family or staff at bedside. Pt denies any further symptoms and wants to go home. He refuses MRI and statin. Upon discussion, he has diligently attempted all statins in past, but this results in poor mobility for him due to severe arthralgias. Will d/c both since it is doubtful that this was a vascular event.   Vitals:   05/10/19 0411 05/10/19 0637 05/10/19 0843 05/10/19 1224  BP: 111/72 124/75 (!) 144/73 (!) 145/75  Pulse: 60 65 70 66  Resp: 18 19 16  (!) 24  Temp: 97.9 F (36.6 C) 98 F (36.7 C) 97.9 F (36.6 C) 98.2 F (36.8 C)  TempSrc: Oral Oral Oral Oral  SpO2: 97% 100% 98% 98%  Weight:      Height:       Lipid Panel:     Component Value Date/Time   CHOL 223 (H) 05/10/2019 0436   TRIG 175 (H) 05/10/2019 0436   HDL 38 (L) 05/10/2019 0436   CHOLHDL 5.9 05/10/2019 0436   VLDL 35 05/10/2019 0436   LDLCALC 150 (H) 05/10/2019 0436   HgbA1c:  Lab Results  Component Value Date   HGBA1C 5.5 05/10/2019   IMAGING past 48 hours CT ANGIO HEAD W OR WO CONTRAST  Result Date: 05/09/2019 CLINICAL DATA:  86 year old male with transient abnormal speech. Symptoms improved/resolved since yesterday. EXAM: CT ANGIOGRAPHY HEAD AND NECK TECHNIQUE: Multidetector CT imaging of the head and neck was performed using the standard protocol during bolus administration of intravenous contrast. Multiplanar CT image reconstructions and MIPs were obtained to evaluate the vascular anatomy. Carotid stenosis measurements (when applicable) are obtained utilizing NASCET criteria, using the distal internal carotid diameter as the denominator. CONTRAST:  31mL OMNIPAQUE IOHEXOL 350 MG/ML SOLN COMPARISON:  Head CT 1332 hours today. FINDINGS: CTA NECK Skeleton: No acute osseous abnormality identified. Intermittent advanced cervical spine facet arthropathy. Upper chest: Negative. Other neck: No acute neck soft tissue findings. Aortic arch: 3 vessel arch configuration with  mild for age soft and calcified arch atherosclerosis. Right carotid system: No brachiocephalic artery or right CCA origin stenosis. Negative right carotid bifurcation. No cervical right ICA stenosis. There is tortuosity of the vessel, and also a beaded and mildly ectatic segment at C1-C2 best seen on series 9, image 74. Left carotid system: Minor plaque at the left CCA origin without stenosis. Negative left carotid bifurcation. Similar tortuosity and ectasia of the cervical left ICA with a mildly beaded appearance (series 12, image 27. No stenosis. Vertebral arteries: Mild plaque in the proximal right subclavian artery without stenosis. Normal right vertebral artery origin. Patent right vertebral artery to the skull base without plaque or stenosis. Mild plaque in the proximal left subclavian artery without stenosis. Normal left vertebral artery origin. Tortuous left V1 segment. Patent left vertebral artery to the skull base without plaque or stenosis. CTA HEAD Posterior circulation: The distal right vertebral artery is mildly dominant. No distal vertebral plaque or stenosis. Normal left PICA origin. The right AICA may be dominant. Patent vertebrobasilar junction and basilar artery with mild ectasia and no stenosis. Normal SCA and PCA origins. Posterior communicating arteries are diminutive or absent. Bilateral PCA branches are within normal limits. Anterior circulation: Both ICA siphons are patent. On the left there is minimal calcified plaque with no stenosis. Similar minimal to mild right siphon calcified plaque without stenosis. Normal ophthalmic artery origins. Patent carotid termini. Patent MCA and ACA origins. Dominant left ACA A1 segment, the right A1 is diminutive.  Anterior communicating artery and bilateral ACA branches are within normal limits. Left MCA M1 segment is mildly tortuous. Patent left MCA bifurcation without stenosis. Left MCA branches are within normal limits. Right MCA M1 segment bifurcates  early and the bifurcation is ectatic but without stenosis. Right MCA branches are within normal limits. Venous sinuses: Early contrast timing, not well evaluated. Anatomic variants: Dominant left ACA A1 segment. Mildly dominant distal right vertebral artery. Review of the MIP images confirms the above findings IMPRESSION: 1. Negative for large vessel occlusion. 2. Some generalized arterial ectasia and evidence of bilateral cervical ICA Fibromuscular Dysplasia (FMD). 3. But minimal atherosclerosis in the head and neck, no arterial stenosis identified. 4. Mild for age aortic arch atherosclerosis. Electronically Signed   By: Genevie Ann M.D.   On: 05/09/2019 19:17   CT HEAD WO CONTRAST  Result Date: 05/09/2019 CLINICAL DATA:  Speech difficulty, now resolved EXAM: CT HEAD WITHOUT CONTRAST TECHNIQUE: Contiguous axial images were obtained from the base of the skull through the vertex without intravenous contrast. COMPARISON:  2016 FINDINGS: Brain: There is no acute intracranial hemorrhage, mass-effect, or edema. Gray-white differentiation is preserved. There is no extra-axial fluid collection. Patchy hypoattenuation in the supratentorial white matter is nonspecific but probably reflects similar mild chronic microvascular ischemic changes. Ventricles and sulci are within normal limits in size and configuration. Vascular: There is atherosclerotic calcification at the skull base. Skull: Calvarium is unremarkable. Sinuses/Orbits: Retention cyst or polyp of the right maxillary sinus. Orbits are unremarkable. Other: Mastoid air cells are clear. IMPRESSION: No acute intracranial hemorrhage, mass effect, or evidence of acute infarction. Electronically Signed   By: Macy Mis M.D.   On: 05/09/2019 13:46   CT ANGIO NECK W OR WO CONTRAST  Result Date: 05/09/2019 CLINICAL DATA:  86 year old male with transient abnormal speech. Symptoms improved/resolved since yesterday. EXAM: CT ANGIOGRAPHY HEAD AND NECK TECHNIQUE:  Multidetector CT imaging of the head and neck was performed using the standard protocol during bolus administration of intravenous contrast. Multiplanar CT image reconstructions and MIPs were obtained to evaluate the vascular anatomy. Carotid stenosis measurements (when applicable) are obtained utilizing NASCET criteria, using the distal internal carotid diameter as the denominator. CONTRAST:  69mL OMNIPAQUE IOHEXOL 350 MG/ML SOLN COMPARISON:  Head CT 1332 hours today. FINDINGS: CTA NECK Skeleton: No acute osseous abnormality identified. Intermittent advanced cervical spine facet arthropathy. Upper chest: Negative. Other neck: No acute neck soft tissue findings. Aortic arch: 3 vessel arch configuration with mild for age soft and calcified arch atherosclerosis. Right carotid system: No brachiocephalic artery or right CCA origin stenosis. Negative right carotid bifurcation. No cervical right ICA stenosis. There is tortuosity of the vessel, and also a beaded and mildly ectatic segment at C1-C2 best seen on series 9, image 74. Left carotid system: Minor plaque at the left CCA origin without stenosis. Negative left carotid bifurcation. Similar tortuosity and ectasia of the cervical left ICA with a mildly beaded appearance (series 12, image 27. No stenosis. Vertebral arteries: Mild plaque in the proximal right subclavian artery without stenosis. Normal right vertebral artery origin. Patent right vertebral artery to the skull base without plaque or stenosis. Mild plaque in the proximal left subclavian artery without stenosis. Normal left vertebral artery origin. Tortuous left V1 segment. Patent left vertebral artery to the skull base without plaque or stenosis. CTA HEAD Posterior circulation: The distal right vertebral artery is mildly dominant. No distal vertebral plaque or stenosis. Normal left PICA origin. The right AICA may be dominant. Patent vertebrobasilar  junction and basilar artery with mild ectasia and no  stenosis. Normal SCA and PCA origins. Posterior communicating arteries are diminutive or absent. Bilateral PCA branches are within normal limits. Anterior circulation: Both ICA siphons are patent. On the left there is minimal calcified plaque with no stenosis. Similar minimal to mild right siphon calcified plaque without stenosis. Normal ophthalmic artery origins. Patent carotid termini. Patent MCA and ACA origins. Dominant left ACA A1 segment, the right A1 is diminutive. Anterior communicating artery and bilateral ACA branches are within normal limits. Left MCA M1 segment is mildly tortuous. Patent left MCA bifurcation without stenosis. Left MCA branches are within normal limits. Right MCA M1 segment bifurcates early and the bifurcation is ectatic but without stenosis. Right MCA branches are within normal limits. Venous sinuses: Early contrast timing, not well evaluated. Anatomic variants: Dominant left ACA A1 segment. Mildly dominant distal right vertebral artery. Review of the MIP images confirms the above findings IMPRESSION: 1. Negative for large vessel occlusion. 2. Some generalized arterial ectasia and evidence of bilateral cervical ICA Fibromuscular Dysplasia (FMD). 3. But minimal atherosclerosis in the head and neck, no arterial stenosis identified. 4. Mild for age aortic arch atherosclerosis. Electronically Signed   By: Genevie Ann M.D.   On: 05/09/2019 19:17   ECHOCARDIOGRAM COMPLETE  Result Date: 05/10/2019    ECHOCARDIOGRAM REPORT   Patient Name:   Larry [name] Date of Exam: 05/10/2019 Medical Rec #:  [id number]        Height:       64.0 in Accession #:    [id number]       Weight:       147.9 lb Date of Birth:  1929-11-30         BSA:          1.72 m Patient Age:    86 years         BP:           144/73 mmHg Patient Gender: M                HR:           70 bpm. Exam Location:  Inpatient Procedure: 2D Echo Indications:    TIA 435.9 / G45.9  History:        Patient has no prior history of Echocardiogram  examinations.                 Risk Factors:Dyslipidemia. Hypothyroidism, thrombocytopenia.  Sonographer:    Darlina Sicilian RDCS Referring Phys: [place]  1. Left ventricular ejection fraction, by estimation, is 55 to 60%. The left ventricle has normal function. The left ventricle has no regional wall motion abnormalities. Left ventricular diastolic parameters are consistent with Grade I diastolic dysfunction (impaired relaxation).  2. Right ventricular systolic function is normal. The right ventricular size is mildly enlarged. Tricuspid regurgitation signal is inadequate for assessing PA pressure.  3. The mitral valve is grossly normal. No evidence of mitral valve regurgitation. No evidence of mitral stenosis.  4. The aortic valve is grossly normal. Aortic valve regurgitation is not visualized. No aortic stenosis is present.  5. The inferior vena cava is normal in size with greater than 50% respiratory variability, suggesting right atrial pressure of 3 mmHg. Comparison(s): No prior Echocardiogram. Conclusion(s)/Recommendation(s): No intracardiac source of embolism detected on this transthoracic study. A transesophageal echocardiogram is recommended to exclude cardiac source of embolism if clinically indicated. FINDINGS  Left Ventricle: Left ventricular ejection fraction, by estimation, is  55 to 60%. The left ventricle has normal function. The left ventricle has no regional wall motion abnormalities. The left ventricular internal cavity size was normal in size. There is  no left ventricular hypertrophy. Left ventricular diastolic parameters are consistent with Grade I diastolic dysfunction (impaired relaxation). Right Ventricle: The right ventricular size is mildly enlarged. No increase in right ventricular wall thickness. Right ventricular systolic function is normal. Tricuspid regurgitation signal is inadequate for assessing PA pressure. Left Atrium: Left atrial size was normal in size. Right  Atrium: Right atrial size was normal in size. Pericardium: There is no evidence of pericardial effusion. Mitral Valve: The mitral valve is grossly normal. No evidence of mitral valve regurgitation. No evidence of mitral valve stenosis. Tricuspid Valve: The tricuspid valve is grossly normal. Tricuspid valve regurgitation is trivial. Aortic Valve: The aortic valve is grossly normal. Aortic valve regurgitation is not visualized. No aortic stenosis is present. Pulmonic Valve: The pulmonic valve was grossly normal. Pulmonic valve regurgitation is not visualized. Aorta: The aortic root is normal in size and structure. Venous: The inferior vena cava is normal in size with greater than 50% respiratory variability, suggesting right atrial pressure of 3 mmHg. IAS/Shunts: No atrial level shunt detected by color flow Doppler.  LEFT VENTRICLE PLAX 2D LVIDd:         4.50 cm  Diastology LVIDs:         2.90 cm  LV e' lateral:   7.72 cm/s LV PW:         0.90 cm  LV E/e' lateral: 7.7 LV IVS:        0.90 cm  LV e' medial:    6.74 cm/s LVOT diam:     2.00 cm  LV E/e' medial:  8.8 LV SV:         42.41 ml LV SV Index:   34.35 LVOT Area:     3.14 cm  RIGHT VENTRICLE RV S prime:     13.80 cm/s TAPSE (M-mode): 2.1 cm LEFT ATRIUM           Index LA diam:      3.40 cm 1.98 cm/m LA Vol (A4C): 42.8 ml 24.87 ml/m  AORTIC VALVE LVOT Vmax:   65.90 cm/s LVOT Vmean:  40.900 cm/s LVOT VTI:    0.135 m  AORTA Ao Root diam: 3.40 cm MITRAL VALVE MV Area (PHT): 2.32 cm    SHUNTS MV Decel Time: 327 msec    Systemic VTI:  0.14 m MV E velocity: 59.10 cm/s  Systemic Diam: 2.00 cm MV A velocity: 71.60 cm/s MV E/A ratio:  0.83 Eleonore Chiquito MD Electronically signed by Eleonore Chiquito MD Signature Date/Time: 05/10/2019/10:36:03 AM    Final     PHYSICAL EXAM Constitutional: Appears well-developed and well-nourished.  Psych: Affect appropriate to situation Eyes: No scleral injection HENT: No OP obstrucion Head: Normocephalic.  Cardiovascular: Normal rate  and regular rhythm.  Respiratory: Effort normal, non-labored breathing GI: Soft. No distension. There is no tenderness.  Skin: WDI  Neuro: Mental Status: Patient is awake, alert, oriented to person, place, month, year, and situation. Speech- naming, repeating, comprehension Patient is able to give a clear and coherent history. Cranial Nerves: II: Visual Fields are full.  III,IV, VI: EOMI without ptosis or diploplia. Pupils equal, round and reactive to light V: Facial sensation is symmetric to temperature VII: Facial movement is symmetric.  VIII: hearing is intact to voice X: Palat elevates symmetrically XI: Shoulder shrug is symmetric. XII: tongue is midline  without atrophy or fasciculations.  Motor: Tone is normal. Bulk is normal. 5/5 strength was present in all four extremities.  No Drift Sensory: Sensation is symmetric to light touch and temperature in the arms and legs. No DSS Deep Tendon Reflexes: 2+ and symmetric in the biceps and patellae.  Plantars: Toes are downgoing bilaterally.  Cerebellar: FNF and HKS are intact bilaterally  ASSESSMENT/PLAN Larry [name] is a 86 y.o. male presenting with a very brief transient episode of alexia and expressive speech difficulty. Given that the pts symptoms were very brief only seconds and have not returned, will not pursue MRI as pt has very bad chlosterphobia.    TIA:  Expressive aphasia/Alexia  CTA head & neck:No LVO, min atherosclerosis in head/neck w/o any significant stenosis. There is some ectasia noted bilat ICA at cervical level.  CTA head: Neg for acute findings  MRI: unable to tolerate, will cancel  2D Echo: EF is 55%, no abnormalty  LDL 150  HgbA1c 5.5  Pt is up freely walking in room. No need for VTE prophylaxis    Diet   Diet Heart Fluid consistency: Thin     Admits to only taking ASA intermittantly prior to admission, now on ASA 325mg  daily  Therapy recommendations:  Home, no rehab  needs  Disposition:  Home  Hypertension  Home meds:  none  Stable . Long-term BP goal normotensive  Hyperlipidemia  Home meds:  Fish oil, red yeast rice  LDL 150, goal < 100  Reviewed past use of statins. Pt is unable to tolerate given age and severe arthralgias with limit ambulation  Given the low probability of stroke or TIA dx, will not add further medications for discharge  NO Diabetes diagnosis  HgbA1c 5.5, goal < 7.0  Other Stroke Risk Factors  Advanced age  Hospital day # 0  Stroke education and BEFAST reviewed with patient. There are no further tests and from neuro stand point he may d/c home at this time.   Desiree Metzger-Cihelka, ARNP-C, ANVP-BC Pager: [phone] I have personally obtained history,examined this patient, reviewed notes, independently viewed imaging studies, participated in medical decision making and plan of care.ROS completed by me personally and pertinent positives fully documented  I have made any additions or clarifications directly to the above note. Agree with note above.  Patient states he presented with barely 10-second episode of speech difficulties and denies any convincing history suggestive of a TIA or stroke.  Neurological exam is nonfocal.  CT scan was negative and patient is absolutely terrified at the prospect of getting an MRI and is refusing to get it done.  Agree with doing no further stroke work-up and discharging patient home.  Discussed with Dr. Grandville Silos.  Greater than 50% time during this 25-minute visit was spent on counseling and coordination of care about possible TIA discussion with care team.  Stroke team will sign off.  Antony Contras, MD Medical Director [facility] Pager: [phone] 05/10/2019 6:20 PM  To contact Stroke Continuity provider, please refer to [email or web address] After hours, contact General Neurology

## 2019-05-10 NOTE — Evaluation (Signed)
Occupational Therapy Evaluation and Discharge Patient Details Name: Larry [name] MRN: [id number] DOB: March 05, 1930 Today's Date: 05/10/2019    History of Present Illness Larry [name] is a 86 y.o. male with medical history significant of hypothyroidism; thrombocytopenia; and HLD presenting with transient aphasia that has now resolved. CT negative and unable to do MRI due to pt with severe claustrophobia   Clinical Impression   This 86 yo male admitted with above presents to acute OT back to his baseline of independent and not issues with communication noted during my session. No PT needs identified and I have made them aware. We will sign off.       Equipment Recommendations  None recommended by OT       Precautions / Restrictions Precautions Precautions: None Restrictions Weight Bearing Restrictions: No      Mobility Bed Mobility Overal bed mobility: Independent                Transfers Overall transfer level: Independent               General transfer comment: He was able to ambulate looking up, down, left, and right without any balance deficits. He was also able to reach down and pick an object off of the floor without loss of balance. Went up and down 5 steps with use of handrails without issues    Balance Overall balance assessment: Independent                                         ADL either performed or assessed with clinical judgement   ADL  Independent                                             Vision Baseline Vision/History: Wears glasses Wears Glasses: Reading only Patient Visual Report: No change from baseline Vision Assessment?: Yes Eye Alignment: Within Functional Limits Ocular Range of Motion: Within Functional Limits Alignment/Gaze Preference: Within Defined Limits Tracking/Visual Pursuits: Able to track stimulus in all quads without difficulty Saccades: Within functional limits Convergence:  Within functional limits Visual Fields: No apparent deficits            Pertinent Vitals/Pain Pain Assessment: No/denies pain     Hand Dominance Right   Extremity/Trunk Assessment Upper Extremity Assessment Upper Extremity Assessment: Overall WFL for tasks assessed   Lower Extremity Assessment Lower Extremity Assessment: Overall WFL for tasks assessed       Communication Communication Communication: No difficulties   Cognition Arousal/Alertness: Awake/alert Behavior During Therapy: WFL for tasks assessed/performed Overall Cognitive Status: Within Functional Limits for tasks assessed                                                Home Living Family/patient expects to be discharged to:: Private residence Living Arrangements: Spouse/significant other Available Help at Discharge: Family;Available 24 hours/day Type of Home: House       Home Layout: Multi-level Alternate Level Stairs-Number of Steps: 2 Alternate Level Stairs-Rails: Can reach both Bathroom Shower/Tub: Walk-in shower;Door   [employer] Toilet: Standard     Home Equipment: Grab bars - tub/shower   Additional Comments: Still mows  his yard, has a garden, and cuts wood with Systems analyst for his wood stove      Prior Functioning/Environment Level of Independence: Independent                          OT Goals(Current goals can be found in the care plan section) Acute Rehab OT Goals Patient Stated Goal: to get all tests done and go home  OT Frequency:                AM-PAC OT "6 Clicks" Daily Activity     Outcome Measure Help from another person eating meals?: None Help from another person taking care of personal grooming?: None Help from another person toileting, which includes using toliet, bedpan, or urinal?: None Help from another person bathing (including washing, rinsing, drying)?: None Help from another person to put on and taking off regular upper body clothing?:  None Help from another person to put on and taking off regular lower body clothing?: None 6 Click Score: 24   End of Session    Activity Tolerance: Patient tolerated treatment well Patient left: (sitting EOB)  OT Visit Diagnosis: Cognitive communication deficit (R41.841) Symptoms and signs involving cognitive functions: (? TIA/CVA)                [id number] OT Time Calculation (min): 17 min Charges:  OT General Charges $OT Visit: 1 Visit OT Evaluation $OT Eval Moderate Complexity: 1 Mod  Cathy OTR/L Acute [employer] Pager [phone] Office [phone]     05/10/2019, 10:43 AM

## 2019-05-10 NOTE — Progress Notes (Signed)
SLP Cancellation Note  Patient Details Name: Kayne Coultas MRN: [id number] DOB: 25-Apr-1929   Cancelled treatment:       Reason Eval/Treat Not Completed: SLP screened, no needs identified, will sign off  Please re-consult should new needs arise.   Marina Goodell, M.Ed., CCC-SLP Speech Therapy Acute Rehabilitation [phone]: Acute Rehab office [phone] - pager   Marina Goodell 05/10/2019, 10:31 AM

## 2019-05-13 ENCOUNTER — Other Ambulatory Visit: Payer: Self-pay

## 2019-05-13 ENCOUNTER — Other Ambulatory Visit (INDEPENDENT_AMBULATORY_CARE_PROVIDER_SITE_OTHER): Payer: Medicare Other

## 2019-05-13 DIAGNOSIS — R739 Hyperglycemia, unspecified: Secondary | ICD-10-CM

## 2019-05-13 LAB — POCT GLYCOSYLATED HEMOGLOBIN (HGB A1C): Hemoglobin A1C: 5.5 % (ref 4.0–5.6)

## 2019-05-20 ENCOUNTER — Telehealth: Payer: Self-pay | Admitting: Family Medicine

## 2019-05-20 NOTE — Telephone Encounter (Signed)
Patient is requesting a call back He would like to discuss blood work that he recently had done

## 2019-05-20 NOTE — Telephone Encounter (Signed)
Discussed lab results with patient. 

## 2019-05-29 ENCOUNTER — Other Ambulatory Visit: Payer: Self-pay

## 2019-05-29 NOTE — Telephone Encounter (Signed)
Fax from [facility] for refill on Folic acid. Do not see in the chart that this has been filled as RX before.  LOV 12/20/2018 AWV. No future appointments.

## 2019-05-29 NOTE — Telephone Encounter (Signed)
I thought he was on this because of his concurrent methotrexate use.  Please verify the pharmacy he wants to use with this medication.  Thanks.

## 2019-05-29 NOTE — Telephone Encounter (Signed)
Noted. Thanks.

## 2019-05-29 NOTE — Telephone Encounter (Signed)
Spoke to patient and was advised that his dermotologist prescribes this medication because he has him on Methotrexate. Patient stated to disregard the request and he will notify [facility] to send the request to Dr. Wilhemina Bonito that prescribes this for him.Marland Kitchen

## 2019-12-05 ENCOUNTER — Other Ambulatory Visit: Payer: Self-pay | Admitting: Family Medicine

## 2019-12-15 ENCOUNTER — Other Ambulatory Visit: Payer: Self-pay | Admitting: Family Medicine

## 2019-12-15 DIAGNOSIS — D696 Thrombocytopenia, unspecified: Secondary | ICD-10-CM

## 2019-12-15 DIAGNOSIS — E039 Hypothyroidism, unspecified: Secondary | ICD-10-CM

## 2019-12-15 DIAGNOSIS — E785 Hyperlipidemia, unspecified: Secondary | ICD-10-CM

## 2019-12-25 ENCOUNTER — Other Ambulatory Visit (INDEPENDENT_AMBULATORY_CARE_PROVIDER_SITE_OTHER): Payer: Medicare Other

## 2019-12-25 ENCOUNTER — Other Ambulatory Visit: Payer: Self-pay

## 2019-12-25 DIAGNOSIS — D696 Thrombocytopenia, unspecified: Secondary | ICD-10-CM

## 2019-12-25 DIAGNOSIS — E039 Hypothyroidism, unspecified: Secondary | ICD-10-CM

## 2019-12-25 DIAGNOSIS — E785 Hyperlipidemia, unspecified: Secondary | ICD-10-CM | POA: Diagnosis not present

## 2019-12-25 LAB — COMPREHENSIVE METABOLIC PANEL
ALT: 19 U/L (ref 0–53)
AST: 16 U/L (ref 0–37)
Albumin: 4.1 g/dL (ref 3.5–5.2)
Alkaline Phosphatase: 49 U/L (ref 39–117)
BUN: 12 mg/dL (ref 6–23)
CO2: 29 mEq/L (ref 19–32)
Calcium: 8.7 mg/dL (ref 8.4–10.5)
Chloride: 103 mEq/L (ref 96–112)
Creatinine, Ser: 0.82 mg/dL (ref 0.40–1.50)
GFR: 78.76 mL/min (ref >60.00)
Glucose, Bld: 102 mg/dL — ABNORMAL HIGH (ref 70–99)
Potassium: 4 mEq/L (ref 3.5–5.1)
Sodium: 137 mEq/L (ref 135–145)
Total Bilirubin: 0.7 mg/dL (ref 0.2–1.2)
Total Protein: 6.1 g/dL (ref 6.0–8.3)

## 2019-12-25 LAB — CBC WITH DIFFERENTIAL/PLATELET
Basophils Absolute: 0 10*3/uL (ref 0.0–0.1)
Basophils Relative: 1 % (ref 0.0–3.0)
Eosinophils Absolute: 0.1 10*3/uL (ref 0.0–0.7)
Eosinophils Relative: 3.5 % (ref 0.0–5.0)
HCT: 45.9 % (ref 39.0–52.0)
Hemoglobin: 15.6 g/dL (ref 13.0–17.0)
Lymphocytes Relative: 25.7 % (ref 12.0–46.0)
Lymphs Abs: 1.1 10*3/uL (ref 0.7–4.0)
MCHC: 34 g/dL (ref 30.0–36.0)
MCV: 95.4 fl (ref 78.0–100.0)
Monocytes Absolute: 0.3 10*3/uL (ref 0.1–1.0)
Monocytes Relative: 7.1 % (ref 3.0–12.0)
Neutro Abs: 2.7 10*3/uL (ref 1.4–7.7)
Neutrophils Relative %: 62.7 % (ref 43.0–77.0)
Platelets: 110 10*3/uL — ABNORMAL LOW (ref 150.0–400.0)
RBC: 4.81 Mil/uL (ref 4.22–5.81)
RDW: 14.6 % (ref 11.5–15.5)
WBC: 4.2 10*3/uL (ref 4.0–10.5)

## 2019-12-25 LAB — LIPID PANEL
Cholesterol: 213 mg/dL — ABNORMAL HIGH (ref 0–200)
HDL: 38.3 mg/dL — ABNORMAL LOW (ref >39.00)
NonHDL: 174.61
Total CHOL/HDL Ratio: 6
Triglycerides: 276 mg/dL — ABNORMAL HIGH (ref 0.0–149.0)
VLDL: 55.2 mg/dL — ABNORMAL HIGH (ref 0.0–40.0)

## 2019-12-25 LAB — LDL CHOLESTEROL, DIRECT: Direct LDL: 106 mg/dL

## 2019-12-25 LAB — TSH: TSH: 3 u[IU]/mL (ref 0.35–4.50)

## 2019-12-27 ENCOUNTER — Other Ambulatory Visit: Payer: Self-pay

## 2019-12-27 ENCOUNTER — Ambulatory Visit (INDEPENDENT_AMBULATORY_CARE_PROVIDER_SITE_OTHER): Payer: Medicare Other

## 2019-12-27 DIAGNOSIS — Z0000 Encounter for general adult medical examination without abnormal findings: Secondary | ICD-10-CM | POA: Diagnosis not present

## 2019-12-27 NOTE — Progress Notes (Signed)
Subjective:   Larry [name] is a 86 y.o. male who presents for Medicare Annual/Subsequent preventive examination.  Review of Systems: N/A      I connected with the patient today by telephone and verified that I am speaking with the correct person using two identifiers. Location patient: home Location nurse: work Persons participating in the telephone visit: patient, nurse.   I discussed the limitations, risks, security and privacy concerns of performing an evaluation and management service by telephone and the availability of in person appointments. I also discussed with the patient that there may be a patient responsible charge related to this service. The patient expressed understanding and verbally consented to this telephonic visit.        Cardiac Risk Factors include: advanced age (>41men, >59 women);male gender;Other (see comment), Risk factor comments: hyperlipidemia     Objective:    Today's Vitals   There is no height or weight on file to calculate BMI.  Advanced Directives 12/27/2019 05/10/2019 05/09/2019 12/14/2017 11/29/2016 12/05/2015 11/04/2015  Does Patient Have a Medical Advance Directive? Yes No No Yes Yes No Yes  Type of Paramedic of [place];Living will - - [place];Living will [place];Living will - [place];Living will  Does patient want to make changes to medical advance directive? - - - No - Patient declined - - No - Patient declined  Copy of [place] in Chart? No - copy requested - - Yes No - copy requested - No - copy requested  Would patient like information on creating a medical advance directive? - No - Patient declined No - Patient declined - - No - patient declined information -  Pre-existing out of facility DNR order (yellow form or pink MOST form) - - - - - - -    Current Medications (verified) Outpatient Encounter Medications as of 12/27/2019    Medication Sig  . ALPRAZolam (XANAX) 0.25 MG tablet Use 1/2 tab twice daily as needed. (Patient taking differently: Take 0.125 mg by mouth 2 (two) times daily as needed for anxiety. )  . aspirin 325 MG tablet Take 1 tablet (325 mg total) by mouth daily.  . calcium carbonate (TUMS - DOSED IN MG ELEMENTAL CALCIUM) 500 MG chewable tablet Chew 1 tablet by mouth daily as needed for indigestion or heartburn.  . Coenzyme Q10 (CO Q 10 PO) Take 1 capsule by mouth daily.  . diclofenac sodium (VOLTAREN) 1 % GEL Apply 2 g topically 4 (four) times daily as needed. (Patient taking differently: Apply 2 g topically 4 (four) times daily as needed (pain). )  . EPINEPHrine 0.3 mg/0.3 mL IJ SOAJ injection INJECT 0.3 MLS (1 PEN) INTO THE MUSCLE ONCE. AS NEEDED (Patient taking differently: Inject 0.3 mg into the muscle as needed for anaphylaxis. )  . folic acid (FOLVITE) 1 MG tablet Take 1 mg by mouth daily.  Marland Kitchen KRILL OIL PO Take 1 tablet by mouth daily.  . methotrexate (RHEUMATREX) 2.5 MG tablet Take 2 tablets (5 mg total) by mouth once a week. Caution:Chemotherapy. Protect from light. (Patient taking differently: Take 5 mg by mouth once a week. On Wednesdays. Caution:Chemotherapy. Protect from light.)  . Omega-3 Fatty Acids (FISH OIL PO) Take 1 tablet by mouth daily.  . Red Yeast Rice Extract (RED YEAST RICE PO) Take 1 tablet by mouth daily.  Marland Kitchen SYNTHROID 50 MCG tablet TAKE 1 TABLET BY MOUTH  DAILY BEFORE BREAKFAST   No facility-administered encounter  medications on file as of 12/27/2019.    Allergies (verified) Alpha-gal; Antihistamines, diphenhydramine-type; Atorvastatin; Beef-derived products; Ezetimibe; Other; Pork-derived products; Simvastatin; Sulfonamide derivatives; and Tramadol   History: Past Medical History:  Diagnosis Date  . 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 Esophageal reflux   . Family history of diabetes mellitus   . History of chickenpox   . Hypertriglyceridemia   . Other and  unspecified hyperlipidemia   . Pre-syncope   . Thrombocytopenia, unspecified ([place])   . Unspecified hypothyroidism    Past Surgical History:  Procedure Laterality Date  . APPENDECTOMY  1949   Family History  Problem Relation Age of Onset  . Stroke Mother   . Diabetes Mother        borderline  . Pneumonia Father   . Hypertension Sister   . Hyperlipidemia Sister   . Alzheimer's disease Brother   . Dementia Brother   . Liver disease Sister        cirrhosis, nondrinker  . Arthritis Other   . Diabetes Other        1st degree relative  . Hyperlipidemia Other   . Hypertension Other   . Prostate cancer Neg Hx   . Colon cancer Neg Hx    Social History   Socioeconomic History  . Marital status: Married    Spouse name: Not on file  . Number of children: 4  . Years of education: Not on file  . Highest education level: Not on file  Occupational History  . Occupation: Retired from [employer]: RETIRED  Tobacco Use  . Smoking status: Never Smoker  . Smokeless tobacco: Never Used  . Tobacco comment: smoked only as a teenage small amount  Vaping Use  . Vaping Use: Never used  Substance and Sexual Activity  . Alcohol use: No  . Drug use: No  . Sexual activity: Not on file  Other Topics Concern  . Not on file  Social History Narrative   Caffeine:  Coffee, tea, 2+ daily.   Diet:  Fish, chicken, vegetables   Regular exercise:  Yes, yard work, gardening   Married 1953   4 kids   Social Determinants of Health   Financial Resource Strain: Low Risk   . Difficulty of Paying Living Expenses: Not hard at all  Food Insecurity: No Food Insecurity  . Worried About Charity fundraiser in the Last Year: Never true  . Ran Out of Food in the Last Year: Never true  Transportation Needs: No Transportation Needs  . Lack of Transportation (Medical): No  . Lack of Transportation (Non-Medical): No  Physical Activity: Inactive  . Days of Exercise per Week: 0 days  . Minutes  of Exercise per Session: 0 min  Stress: No Stress Concern Present  . Feeling of Stress : Not at all  Social Connections:   . Frequency of Communication with Friends and Family: Not on file  . Frequency of Social Gatherings with Friends and Family: Not on file  . Attends Religious Services: Not on file  . Active Member of Clubs or Organizations: Not on file  . Attends Archivist Meetings: Not on file  . Marital Status: Not on file    Tobacco Counseling Counseling given: Not Answered Comment: smoked only as a teenage small amount   Clinical Intake:  Pre-visit preparation completed: Yes  Pain : No/denies pain     Nutritional Risks: None Diabetes: No  How often do  you need to have someone help you when you read instructions, pamphlets, or other written materials from your doctor or pharmacy?: 1 - Never What is the last grade level you completed in school?: 12th  Diabetic: No Nutrition Risk Assessment:  Has the patient had any N/V/D within the last 2 months?  No  Does the patient have any non-healing wounds?  No  Has the patient had any unintentional weight loss or weight gain?  No   Diabetes:  Is the patient diabetic?  No  If diabetic, was a CBG obtained today?  N/A Did the patient bring in their glucometer from home?  N/A How often do you monitor your CBG's? N/A.   Financial Strains and Diabetes Management:  Are you having any financial strains with the device, your supplies or your medication? N/A.  Does the patient want to be seen by Chronic Care Management for management of their diabetes?  N/A Would the patient like to be referred to a Nutritionist or for Diabetic Management?  N/A   Interpreter Needed?: No  Information entered by :: CJohnson, LPN   Activities of Daily Living In your present state of health, do you have any difficulty performing the following activities: 12/27/2019 05/10/2019  Hearing? Y N  Comment has hearing aids, does not wear  all the time -  Vision? N N  Difficulty concentrating or making decisions? N N  Walking or climbing stairs? N N  Dressing or bathing? N N  Doing errands, shopping? N N  Preparing Food and eating ? N -  Using the Toilet? N -  In the past six months, have you accidently leaked urine? N -  Do you have problems with loss of bowel control? N -  Managing your Medications? N -  Managing your Finances? N -  Housekeeping or managing your Housekeeping? N -  Some recent data might be hidden    Patient Care Team: Tonia Ghent, MD as PCP - Sandford Craze, MD as Consulting Physician (Dermatology) Mosetta Anis, MD as Referring Physician (Allergy)  Indicate any recent Medical Services you may have received from other than [facility] providers in the past year (date may be approximate).     Assessment:   This is a routine wellness examination for Sajid.  Hearing/Vision screen  Hearing Screening   125Hz  250Hz  500Hz  1000Hz  2000Hz  3000Hz  4000Hz  6000Hz  8000Hz   Right ear:           Left ear:           Vision Screening Comments: Patient gets annual eye exams   Dietary issues and exercise activities discussed: Current Exercise Habits: The patient does not participate in regular exercise at present, Exercise limited by: None identified  Goals    . Increase water intake     Starting 12/14/2017, I will attempt to drink at least 6-8 glasses of water daily to prevent dehydration.     . Patient Stated     12/27/2019, I will maintain and continue medications as prescribed.       Depression Screen PHQ 2/9 Scores 12/27/2019 12/20/2018 12/14/2017 12/05/2016 11/29/2016 11/27/2015 10/02/2013  PHQ - 2 Score 0 0 0 0 0 0 0  PHQ- 9 Score 0 - 0 - 0 - -    Fall Risk Fall Risk  12/27/2019 12/20/2018 12/14/2017 12/05/2016 11/29/2016  Falls in the past year? 0 0 No No No  Number falls in past yr: 0 - - - -  Injury with Fall? 0 - - - -  Risk for fall due to : No Fall Risks - - - -  Follow up Falls evaluation  completed;Falls prevention discussed - - - -    Any stairs in or around the home? Yes  If so, are there any without handrails? Yes  Home free of loose throw rugs in walkways, pet beds, electrical cords, etc? Yes  Adequate lighting in your home to reduce risk of falls? Yes   ASSISTIVE DEVICES UTILIZED TO PREVENT FALLS:  Life alert? No  Use of a cane, walker or w/c? No  Grab bars in the bathroom? No  Shower chair or bench in shower? No  Elevated toilet seat or a handicapped toilet? No   TIMED UP AND GO:  Was the test performed? N/A, telephonic visit .   Cognitive Function: MMSE - Mini Mental State Exam 12/27/2019 12/14/2017 11/29/2016  Orientation to time 5 5 5   Orientation to Place 5 5 5   Registration 3 3 3   Attention/ Calculation 5 0 0  Recall 3 3 3   Language- name 2 objects - 0 0  Language- repeat 1 1 1   Language- follow 3 step command - 3 3  Language- read & follow direction - 0 0  Write a sentence - 0 0  Copy design - 0 0  Total score - 20 20  Mini Cog  Mini-Cog screen was completed. Maximum score is 22. A value of 0 denotes this part of the MMSE was not completed or the patient failed this part of the Mini-Cog screening.       Immunizations Immunization History  Administered Date(s) Administered  . Fluad Quad(high Dose 65+) 12/20/2018  . Influenza Split 12/30/2010, 12/02/2011  . Influenza Whole 01/13/2010  . Influenza,inj,Quad PF,6+ Mos 01/08/2013, 01/16/2014, 12/26/2014, 11/27/2015, 01/04/2017, 01/11/2018  . PFIZER SARS-COV-2 Vaccination 04/09/2019, 05/04/2019  . Pneumococcal Conjugate-13 10/23/2014  . Pneumococcal Polysaccharide-23 10/18/2001  . Td 09/19/2002, 11/19/2013  . Zoster 03/22/2007    TDAP status: Up to date Flu Vaccine status: due, will get at upcoming physical Pneumococcal vaccine status: Up to date Covid-19 vaccine status: Completed vaccines  Qualifies for Shingles Vaccine? Yes   Zostavax completed Yes   Shingrix Completed?: No.     Education has been provided regarding the importance of this vaccine. Patient has been advised to call insurance company to determine out of pocket expense if they have not yet received this vaccine. Advised may also receive vaccine at local pharmacy or Health Dept. Verbalized acceptance and understanding.  Screening Tests Health Maintenance  Topic Date Due  . INFLUENZA VACCINE  10/20/2019  . TETANUS/TDAP  11/20/2023  . COVID-19 Vaccine  Completed  . PNA vac Low Risk Adult  Completed    Health Maintenance  Health Maintenance Due  Topic Date Due  . INFLUENZA VACCINE  10/20/2019    Colorectal cancer screening: No longer required.   Lung Cancer Screening: (Low Dose CT Chest recommended if Age 84-80 years, 30 pack-year currently smoking OR have quit w/in 15years.) does not qualify.   Additional Screening:  Hepatitis C Screening: does not qualify; Completed N/A  Vision Screening: Recommended annual ophthalmology exams for early detection of glaucoma and other disorders of the eye. Is the patient up to date with their annual eye exam?  Yes  Who is the provider or what is the name of the office in which the patient attends annual eye exams? [facility] If pt is not established with a provider, would they like to be referred to a provider to establish  care? No .   Dental Screening: Recommended annual dental exams for proper oral hygiene  Community Resource Referral / Chronic Care Management: CRR required this visit?  No   CCM required this visit?  No      Plan:     I have personally reviewed and noted the following in the patient's chart:   . Medical and social history . Use of alcohol, tobacco or illicit drugs  . Current medications and supplements . Functional ability and status . Nutritional status . Physical activity . Advanced directives . List of other physicians . Hospitalizations, surgeries, and ER visits in previous 12 months . Vitals . Screenings to include  cognitive, depression, and falls . Referrals and appointments  In addition, I have reviewed and discussed with patient certain preventive protocols, quality metrics, and best practice recommendations. A written personalized care plan for preventive services as well as general preventive health recommendations were provided to patient.   Due to this being a telephonic visit, the after visit summary with patients personalized plan was offered to patient via office or my-chart.  Patient preferred to pick up at office at next visit or via mychart.   Andrez Grime, LPN   [id number]

## 2019-12-27 NOTE — Patient Instructions (Signed)
[name] , Thank you for taking time to come for your Medicare Wellness Visit. I appreciate your ongoing commitment to your health goals. Please review the following plan we discussed and let me know if I can assist you in the future.   Screening recommendations/referrals: Colonoscopy: no longer required Recommended yearly ophthalmology/optometry visit for glaucoma screening and checkup Recommended yearly dental visit for hygiene and checkup  Vaccinations: Influenza vaccine: due, will get at upcoming physical Pneumococcal vaccine: Completed series Tdap vaccine: Up to date, completed 11/19/2013, due 11/2023 Shingles vaccine: due, check with your insurance regarding coverage if interested   Covid-19: Completed series  Advanced directives: Please bring a copy of your POA (Power of [place]) and/or Living Will to your next appointment.  Conditions/risks identified: hyperlipidemia  Next appointment: Follow up in one year for your annual wellness visit.   Preventive Care 24 Years and Older, Male Preventive care refers to lifestyle choices and visits with your health care provider that can promote health and wellness. What does preventive care include?  A yearly physical exam. This is also called an annual well check.  Dental exams once or twice a year.  Routine eye exams. Ask your health care provider how often you should have your eyes checked.  Personal lifestyle choices, including:  Daily care of your teeth and gums.  Regular physical activity.  Eating a healthy diet.  Avoiding tobacco and drug use.  Limiting alcohol use.  Practicing safe sex.  Taking low doses of aspirin every day.  Taking vitamin and mineral supplements as recommended by your health care provider. What happens during an annual well check? The services and screenings done by your health care provider during your annual well check will depend on your age, overall health, lifestyle risk factors, and  family history of disease. Counseling  Your health care provider may ask you questions about your:  Alcohol use.  Tobacco use.  Drug use.  Emotional well-being.  Home and relationship well-being.  Sexual activity.  Eating habits.  History of falls.  Memory and ability to understand (cognition).  Work and work Statistician. Screening  You may have the following tests or measurements:  Height, weight, and BMI.  Blood pressure.  Lipid and cholesterol levels. These may be checked every 5 years, or more frequently if you are over 64 years old.  Skin check.  Lung cancer screening. You may have this screening every year starting at age 9 if you have a 30-pack-year history of smoking and currently smoke or have quit within the past 15 years.  Fecal occult blood test (FOBT) of the stool. You may have this test every year starting at age 71.  Flexible sigmoidoscopy or colonoscopy. You may have a sigmoidoscopy every 5 years or a colonoscopy every 10 years starting at age 28.  Prostate cancer screening. Recommendations will vary depending on your family history and other risks.  Hepatitis C blood test.  Hepatitis B blood test.  Sexually transmitted disease (STD) testing.  Diabetes screening. This is done by checking your blood sugar (glucose) after you have not eaten for a while (fasting). You may have this done every 1-3 years.  Abdominal aortic aneurysm (AAA) screening. You may need this if you are a current or former smoker.  Osteoporosis. You may be screened starting at age 10 if you are at high risk. Talk with your health care provider about your test results, treatment options, and if necessary, the need for more tests. Vaccines  Your health care  provider may recommend certain vaccines, such as:  Influenza vaccine. This is recommended every year.  Tetanus, diphtheria, and acellular pertussis (Tdap, Td) vaccine. You may need a Td booster every 10 years.  Zoster  vaccine. You may need this after age 67.  Pneumococcal 13-valent conjugate (PCV13) vaccine. One dose is recommended after age 17.  Pneumococcal polysaccharide (PPSV23) vaccine. One dose is recommended after age 67. Talk to your health care provider about which screenings and vaccines you need and how often you need them. This information is not intended to replace advice given to you by your health care provider. Make sure you discuss any questions you have with your health care provider. Document Released: 04/03/2015 Document Revised: 11/25/2015 Document Reviewed: 01/06/2015 Elsevier Interactive Patient Education  2017 [place] Prevention in the Home Falls can cause injuries. They can happen to people of all ages. There are many things you can do to make your home safe and to help prevent falls. What can I do on the outside of my home?  Regularly fix the edges of walkways and driveways and fix any cracks.  Remove anything that might make you trip as you walk through a door, such as a raised step or threshold.  Trim any bushes or trees on the path to your home.  Use bright outdoor lighting.  Clear any walking paths of anything that might make someone trip, such as rocks or tools.  Regularly check to see if handrails are loose or broken. Make sure that both sides of any steps have handrails.  Any raised decks and porches should have guardrails on the edges.  Have any leaves, snow, or ice cleared regularly.  Use sand or salt on walking paths during winter.  Clean up any spills in your garage right away. This includes oil or grease spills. What can I do in the bathroom?  Use night lights.  Install grab bars by the toilet and in the tub and shower. Do not use towel bars as grab bars.  Use non-skid mats or decals in the tub or shower.  If you need to sit down in the shower, use a plastic, non-slip stool.  Keep the floor dry. Clean up any water that spills on the  floor as soon as it happens.  Remove soap buildup in the tub or shower regularly.  Attach bath mats securely with double-sided non-slip rug tape.  Do not have throw rugs and other things on the floor that can make you trip. What can I do in the bedroom?  Use night lights.  Make sure that you have a light by your bed that is easy to reach.  Do not use any sheets or blankets that are too big for your bed. They should not hang down onto the floor.  Have a firm chair that has side arms. You can use this for support while you get dressed.  Do not have throw rugs and other things on the floor that can make you trip. What can I do in the kitchen?  Clean up any spills right away.  Avoid walking on wet floors.  Keep items that you use a lot in easy-to-reach places.  If you need to reach something above you, use a strong step stool that has a grab bar.  Keep electrical cords out of the way.  Do not use floor polish or wax that makes floors slippery. If you must use wax, use non-skid floor wax.  Do not have throw  rugs and other things on the floor that can make you trip. What can I do with my stairs?  Do not leave any items on the stairs.  Make sure that there are handrails on both sides of the stairs and use them. Fix handrails that are broken or loose. Make sure that handrails are as [place] as the stairways.  Check any carpeting to make sure that it is firmly attached to the stairs. Fix any carpet that is loose or worn.  Avoid having throw rugs at the top or bottom of the stairs. If you do have throw rugs, attach them to the floor with carpet tape.  Make sure that you have a light switch at the top of the stairs and the bottom of the stairs. If you do not have them, ask someone to add them for you. What else can I do to help prevent falls?  Wear shoes that:  Do not have high heels.  Have rubber bottoms.  Are comfortable and fit you well.  Are closed at the toe. Do not wear  sandals.  If you use a stepladder:  Make sure that it is fully opened. Do not climb a closed stepladder.  Make sure that both sides of the stepladder are locked into place.  Ask someone to hold it for you, if possible.  Clearly mark and make sure that you can see:  Any grab bars or handrails.  First and last steps.  Where the edge of each step is.  Use tools that help you move around (mobility aids) if they are needed. These include:  Canes.  Walkers.  Scooters.  Crutches.  Turn on the lights when you go into a dark area. Replace any light bulbs as soon as they burn out.  Set up your furniture so you have a clear path. Avoid moving your furniture around.  If any of your floors are uneven, fix them.  If there are any pets around you, be aware of where they are.  Review your medicines with your doctor. Some medicines can make you feel dizzy. This can increase your chance of falling. Ask your doctor what other things that you can do to help prevent falls. This information is not intended to replace advice given to you by your health care provider. Make sure you discuss any questions you have with your health care provider. Document Released: 01/01/2009 Document Revised: 08/13/2015 Document Reviewed: 04/11/2014 Elsevier Interactive Patient Education  2017 [employer].

## 2019-12-27 NOTE — Progress Notes (Signed)
PCP notes:  Health Maintenance: Flu- due   Abnormal Screenings: none   Patient concerns: none   Nurse concerns: none   Next PCP appt.: 12/31/2010 @ 2:30 pm

## 2019-12-31 ENCOUNTER — Ambulatory Visit: Payer: Medicare Other | Admitting: Family Medicine

## 2020-01-01 ENCOUNTER — Other Ambulatory Visit: Payer: Self-pay

## 2020-01-01 ENCOUNTER — Ambulatory Visit (INDEPENDENT_AMBULATORY_CARE_PROVIDER_SITE_OTHER): Payer: Medicare Other

## 2020-01-01 DIAGNOSIS — Z23 Encounter for immunization: Secondary | ICD-10-CM | POA: Diagnosis not present

## 2020-01-10 ENCOUNTER — Encounter: Payer: Self-pay | Admitting: Family Medicine

## 2020-01-10 ENCOUNTER — Other Ambulatory Visit: Payer: Self-pay

## 2020-01-10 ENCOUNTER — Ambulatory Visit: Payer: Medicare Other | Admitting: Family Medicine

## 2020-01-10 VITALS — BP 132/80 | HR 71 | Temp 96.5°F | Ht 64.0 in | Wt 149.2 lb

## 2020-01-10 DIAGNOSIS — D696 Thrombocytopenia, unspecified: Secondary | ICD-10-CM

## 2020-01-10 DIAGNOSIS — L309 Dermatitis, unspecified: Secondary | ICD-10-CM

## 2020-01-10 DIAGNOSIS — E785 Hyperlipidemia, unspecified: Secondary | ICD-10-CM | POA: Diagnosis not present

## 2020-01-10 DIAGNOSIS — Z91018 Allergy to other foods: Secondary | ICD-10-CM | POA: Diagnosis not present

## 2020-01-10 DIAGNOSIS — Z0000 Encounter for general adult medical examination without abnormal findings: Secondary | ICD-10-CM

## 2020-01-10 DIAGNOSIS — E039 Hypothyroidism, unspecified: Secondary | ICD-10-CM

## 2020-01-10 DIAGNOSIS — Z7189 Other specified counseling: Secondary | ICD-10-CM

## 2020-01-10 NOTE — Patient Instructions (Signed)
Keep your routine follow up with dermatology.  Don't change your meds for now.  Take care.  Glad to see you. Update me as needed.

## 2020-01-10 NOTE — Progress Notes (Signed)
This visit occurred during the SARS-CoV-2 public health emergency.  Safety protocols were in place, including screening questions prior to the visit, additional usage of staff PPE, and extensive cleaning of exam room while observing appropriate contact time as indicated for disinfecting solutions.  Alpha-gal d/w pt.  He is careful about exposure and no recent events.  Cautions d/w pt.  No epipen use/need.    Hypothyroidism.  No neck mass.  No dysphagia.  tsh wnl.  Compliant.    HLD. Tolerating RYR.  He is active.  Healthy diet.  Labs discussed with patient.  He went to [facility] with eye watering after using a concrete sealant prev on his driveway. No vision changes.  Not having residual symptoms.  It sounds like he had an isolated episode of irritation.  Low platelets.   Stable.  No bleeding.  Labs D/w pt.    He is still seeing Dr. Wilhemina Bonito with dermatology.  I will defer.  He agrees.  Tetanus 2015 PNA UTD Shingles 2009 Flu shot 2021 covid vaccine 2021 PSA and colon cancer screening deferred given age.   Advance directive-wife designated if patient were incapacitated.  Meds, vitals, and allergies reviewed.   ROS: Per HPI unless specifically indicated in ROS section   GEN: nad, alert and oriented HEENT: ncat NECK: supple w/o LA, no thyromegaly. CV: rrr PULM: ctab, no inc wob ABD: soft, +bs EXT: no edema SKIN: no acute rash

## 2020-01-12 NOTE — Assessment & Plan Note (Signed)
°  He is still seeing Dr. Wilhemina Bonito with dermatology.  I will defer.  He agrees.

## 2020-01-12 NOTE — Assessment & Plan Note (Signed)
Advance directive- wife designated if patient were incapacitated.  

## 2020-01-12 NOTE — Assessment & Plan Note (Signed)
Tetanus 2015 PNA UTD Shingles 2009 Flu shot 2021 covid vaccine 2021 PSA and colon cancer screening deferred given age.   Advance directive-wife designated if patient were incapacitated.

## 2020-01-12 NOTE — Assessment & Plan Note (Signed)
Tolerating RYR.  He is active.  Healthy diet.  Labs discussed with patient.  Continue red yeast rice as is.

## 2020-01-12 NOTE — Assessment & Plan Note (Signed)
Continue with trigger avoidance.  Routine cautions given to patient.  He is careful.  He will update me as needed.

## 2020-01-12 NOTE — Assessment & Plan Note (Signed)
Low platelets.   Stable.  No bleeding.  Labs D/w pt.

## 2020-01-12 NOTE — Assessment & Plan Note (Signed)
Hypothyroidism.  No neck mass.  No dysphagia.  tsh wnl.  Compliant.   Continue as is.  He agrees.

## 2020-02-24 ENCOUNTER — Other Ambulatory Visit: Payer: Self-pay

## 2020-02-24 ENCOUNTER — Emergency Department
Admission: EM | Admit: 2020-02-24 | Discharge: 2020-02-24 | Disposition: A | Discharge status: Home / Self Care | Payer: Medicare Other | Attending: Emergency Medicine | Admitting: Emergency Medicine

## 2020-02-24 DIAGNOSIS — Z5321 Procedure and treatment not carried out due to patient leaving prior to being seen by health care provider: Secondary | ICD-10-CM | POA: Diagnosis not present

## 2020-02-24 DIAGNOSIS — R519 Headache, unspecified: Secondary | ICD-10-CM | POA: Insufficient documentation

## 2020-02-24 LAB — DIFFERENTIAL
Abs Immature Granulocytes: 0.02 10*3/uL (ref 0.00–0.07)
Basophils Absolute: 0 10*3/uL (ref 0.0–0.1)
Basophils Relative: 1 %
Eosinophils Absolute: 0.1 10*3/uL (ref 0.0–0.5)
Eosinophils Relative: 1 %
Immature Granulocytes: 0 %
Lymphocytes Relative: 16 %
Lymphs Abs: 1 10*3/uL (ref 0.7–4.0)
Monocytes Absolute: 0.5 10*3/uL (ref 0.1–1.0)
Monocytes Relative: 8 %
Neutro Abs: 4.6 10*3/uL (ref 1.7–7.7)
Neutrophils Relative %: 74 %

## 2020-02-24 LAB — COMPREHENSIVE METABOLIC PANEL
ALT: 18 U/L (ref 0–44)
AST: 19 U/L (ref 15–41)
Albumin: 4.2 g/dL (ref 3.5–5.0)
Alkaline Phosphatase: 53 U/L (ref 38–126)
Anion gap: 11 (ref 5–15)
BUN: 13 mg/dL (ref 8–23)
CO2: 23 mmol/L (ref 22–32)
Calcium: 9.2 mg/dL (ref 8.9–10.3)
Chloride: 102 mmol/L (ref 98–111)
Creatinine, Ser: 0.76 mg/dL (ref 0.61–1.24)
GFR, Estimated: >60 mL/min (ref >60)
Glucose, Bld: 113 mg/dL — ABNORMAL HIGH (ref 70–99)
Potassium: 3.9 mmol/L (ref 3.5–5.1)
Sodium: 136 mmol/L (ref 135–145)
Total Bilirubin: 0.7 mg/dL (ref 0.3–1.2)
Total Protein: 6.7 g/dL (ref 6.5–8.1)

## 2020-02-24 LAB — CBC
HCT: 45.5 % (ref 39.0–52.0)
Hemoglobin: 15.7 g/dL (ref 13.0–17.0)
MCH: 32.6 pg (ref 26.0–34.0)
MCHC: 34.5 g/dL (ref 30.0–36.0)
MCV: 94.6 fL (ref 80.0–100.0)
Platelets: 143 10*3/uL — ABNORMAL LOW (ref 150–400)
RBC: 4.81 MIL/uL (ref 4.22–5.81)
RDW: 13.6 % (ref 11.5–15.5)
WBC: 6.2 10*3/uL (ref 4.0–10.5)
nRBC: 0 % (ref 0.0–0.2)

## 2020-02-24 NOTE — ED Notes (Signed)
Pt ambulatory in lobby without difficulty or distress noted; pt is talkative, speech clear; updated on wait time; reviewed pt's chart and alerted Dr Joni Fears to pt's symptoms and requested further orders while waiting; awaiting MD response

## 2020-02-24 NOTE — ED Notes (Signed)
NA in lobby for vital check

## 2020-02-24 NOTE — ED Notes (Signed)
No answer x2 when called for vital signs

## 2020-02-24 NOTE — ED Triage Notes (Addendum)
Pt via POV from home. Pt states that last night around 6:00pm, pt had trouble expressing his words for about 10-15 minutes. Denies any trouble since. Pt states that he had a headache earlier today but took Tylenol which is now resolved. Pt states he feels good now. Pt is A&Ox4 and NAD. Ambulatory to triage.

## 2020-02-25 ENCOUNTER — Telehealth: Payer: Self-pay

## 2020-02-25 NOTE — Telephone Encounter (Signed)
I spoke with pt's wife and pts daughter is taking pt to [facility] ED now.FYI to Dr Damita Dunnings.

## 2020-02-25 NOTE — Telephone Encounter (Signed)
If he had new speech changes, then I would rec ER eval ASAP.

## 2020-02-25 NOTE — Telephone Encounter (Signed)
Thanks

## 2020-02-25 NOTE — Telephone Encounter (Signed)
Pt left v/m that he had some reaction to a covid booster pt had 4- 6 wks ago at [facility]. I spoke with pts wife (DPR signed) and On 02/23/20 pt had confusion and not able to get his words out; on 02/24/20 pt had severe H/A also and pts daughter took pt to [facility] ED and pt waited 5 hours and left without being seen. Today pt is feeling better and has gone to grocery store and is outside working. I spoke with pt and he said he did go to ED but they were taking everyone ahead of him. I explained that they triaged each pt at ED but pt said he is busy today and might go to ED on 02/26/20. I advised pt concern for pending stroke and pt still thinks is reaction to the covid booster he had 4 - 6 wks ago. Pt said he would see what he could do about going to [facility] ED today. Again went over ED precautions andpt voiced understanding. Sending note to Dr Damita Dunnings.

## 2020-02-26 ENCOUNTER — Telehealth: Payer: Self-pay | Admitting: *Deleted

## 2020-02-26 DIAGNOSIS — R4789 Other speech disturbances: Secondary | ICD-10-CM

## 2020-02-26 NOTE — Telephone Encounter (Signed)
I can see some of the records in the EMR.  His CT did not show any new findings in his brain and his labs looked unremarkable.  That is all good news.  If he notices other changes, then let us know/seek evaluation.  Please request the full report from the emergency room in [place] so I can see his exam, etc.  Some of that is not currently visible in the EMR.  He had an echo done earlier this year.  I do think it makes sense to check his carotids with an ultrasound.  I put in the order for that.  I will await those results and then we will update the patient.  Thanks.

## 2020-02-26 NOTE — Telephone Encounter (Signed)
Patient called stating that he went to the ER in [place] yesterday. Patent stated that everything checked out okay. Patient stated that he is feeling fine today. Patient requested that Dr. Damita Dunnings look at his ER records and let him know if he needs to do anything else. Patient stated to let him know if he needs to schedule a follow-up appointment with Dr. Damita Dunnings.

## 2020-02-27 ENCOUNTER — Ambulatory Visit: Payer: Medicare Other | Admitting: Cardiovascular Disease

## 2020-02-27 NOTE — Telephone Encounter (Signed)
Spoke with Mrs. Rogstad, ok per DPR on file, moved up [place] appt to 03/02/20 at 4 pm at [facility] at [employer].

## 2020-02-27 NOTE — Telephone Encounter (Signed)
Please Advise

## 2020-02-27 NOTE — Telephone Encounter (Signed)
Can his u/s get moved sooner?

## 2020-02-27 NOTE — Telephone Encounter (Signed)
Pt called triage to ask Dr Damita Dunnings if March 23, 2020 okay to wait to have the Carotid Doppler done. They told him that was the 1st available. Please advise [phone]

## 2020-03-02 ENCOUNTER — Ambulatory Visit (HOSPITAL_COMMUNITY)
Admission: RE | Admit: 2020-03-02 | Discharge: 2020-03-02 | Disposition: A | Discharge status: Home / Self Care | Payer: Medicare Other | Source: Ambulatory Visit | Attending: Cardiology | Admitting: Cardiology

## 2020-03-02 ENCOUNTER — Other Ambulatory Visit: Payer: Self-pay

## 2020-03-02 DIAGNOSIS — R4789 Other speech disturbances: Secondary | ICD-10-CM | POA: Insufficient documentation

## 2020-03-04 ENCOUNTER — Other Ambulatory Visit: Payer: Self-pay | Admitting: Family Medicine

## 2020-03-04 ENCOUNTER — Telehealth: Payer: Self-pay | Admitting: *Deleted

## 2020-03-04 NOTE — Telephone Encounter (Signed)
Patient called stating that he had the test done on his carotid. Patient stated that he would like the results and know what Dr. Damita Dunnings recommends that he do.

## 2020-03-04 NOTE — Telephone Encounter (Signed)
I was working on addressing this today.  His carotids look good with only minimal atherosclerotic changes.  This is reassuring.  I realize he cannot take statin medications.  If the presumption is that he had a TIA, one would normally start a statin but he clearly cannot tolerate those.  Please verify with patient about his current aspirin use.  If he is not taking any, then I would start taking 81 mg a day.  If he is already taking 81 mg a day, I would increase to 325 mg.  If he is already on 325 mg aspirin daily then do not change his dose yet and let me know.  Please update the med list.  I think it makes sense to get the patient set back up with neurology to see if they have any other input.  Please let me know if he consents to outpatient neurology evaluation.  If he has no symptoms in the meantime with focal neurologic changes, then he needs to go to the emergency room.  Thanks.

## 2020-03-06 ENCOUNTER — Telehealth: Payer: Self-pay

## 2020-03-06 NOTE — Telephone Encounter (Signed)
What is his current aspirin dose?  I need that updated in the chart.

## 2020-03-06 NOTE — Telephone Encounter (Signed)
I have already spoken with this pt regarding is [place] results from Dr Damita Dunnings. He also stated to me that he does not want to go up to 325mg  of Asprin and he does not think that he needs to see neuro.  Thank you,  Leamon Arnt

## 2020-03-06 NOTE — Telephone Encounter (Signed)
Pt left v/m requesting cb about recent scan and [place] results to see what Dr Damita Dunnings recommends. Sending note to Mardene Celeste CMA and [employer].

## 2020-03-06 NOTE — Telephone Encounter (Signed)
81 mg 

## 2020-03-06 NOTE — Telephone Encounter (Signed)
Spoke with pt and he stated that he does not want to go on 325mg  Asprin and he stated that he does not need to go to neuro.  Please Advise

## 2020-03-08 MED ORDER — ASPIRIN EC 81 MG PO TBEC: 81.0000 mg | DELAYED_RELEASE_TABLET | Freq: Every day | ORAL | Status: DC

## 2020-03-08 NOTE — Addendum Note (Signed)
Addended by: Tonia Ghent on: 03/08/2020 01:13 PM   Modules accepted: Orders

## 2020-03-08 NOTE — Telephone Encounter (Signed)
Updated med list.  Thanks.

## 2020-03-09 ENCOUNTER — Ambulatory Visit: Payer: Medicare Other | Admitting: Family Medicine

## 2020-03-09 ENCOUNTER — Other Ambulatory Visit: Payer: Self-pay

## 2020-03-09 VITALS — BP 142/72 | HR 71 | Temp 96.6°F | Ht 65.0 in | Wt 154.5 lb

## 2020-03-09 DIAGNOSIS — G459 Transient cerebral ischemic attack, unspecified: Secondary | ICD-10-CM | POA: Diagnosis not present

## 2020-03-09 DIAGNOSIS — H04209 Unspecified epiphora, unspecified lacrimal gland: Secondary | ICD-10-CM | POA: Diagnosis not present

## 2020-03-09 DIAGNOSIS — R4789 Other speech disturbances: Secondary | ICD-10-CM

## 2020-03-09 MED ORDER — ASPIRIN EC 81 MG PO TBEC: 324.0000 mg | DELAYED_RELEASE_TABLET | Freq: Every day | ORAL | Status: DC

## 2020-03-09 MED ORDER — ASPIRIN EC 81 MG PO TBEC: 81.0000 mg | DELAYED_RELEASE_TABLET | Freq: Every day | ORAL | Status: AC

## 2020-03-09 NOTE — Progress Notes (Signed)
This visit occurred during the SARS-CoV-2 public health emergency.  Safety protocols were in place, including screening questions prior to the visit, additional usage of staff PPE, and extensive cleaning of exam room while observing appropriate contact time as indicated for disinfecting solutions.  Wife noted that he was getting confused and his speech was acutely different.  No LOC.  Lasted about 1 minute.  That was back in early 2021.  Prev CT done at the time, d/w pt.  CT with: No acute intracranial hemorrhage, mass effect, or evidence of acute infarction.  Symptoms resolved.  He had his booster dose about 6 weeks ago.  More recently he had the same return of sx. Brief speech changes recently.   He didn't know if the vaccine was related to recent sx but there was a clear lag time between his most recent vaccine and the event, several weeks apart.  He had seen Glennon Mac at [facility].  He had watering that was affecting his vision.  No loss of vision.  No double vision.  We talked about getting a second opinion, he wanted to get a second opinion.  He is still doing all of his yard work, for up to two hours at a time.  No CP, not SOB.  He is back on aspirin, up to 4 times a day.  He wasn't on aspirin at the time of the second event.    Meds, vitals, and allergies reviewed.   ROS: Per HPI unless specifically indicated in ROS section   GEN: nad, alert and oriented HEENT: ncat NECK: supple w/o LA, no bruit.   CV: rrr.   PULM: ctab, no inc wob ABD: soft, +bs EXT: no edema SKIN: Well-perfused CN 2-12 wnl B, S/S wnl x4  31 minutes were devoted to patient care in this encounter (this includes time spent reviewing the patient's file/history, interviewing and examining the patient, counseling/reviewing plan with patient).

## 2020-03-09 NOTE — Patient Instructions (Addendum)
We'll call about seeing the eye clinic and neurology.  I would take 81mg  aspirin a day for now.  If you have any other speech changes then seek eval.  Take care.  Glad to see you.

## 2020-03-11 NOTE — Assessment & Plan Note (Signed)
Possible TIA.  Differential diagnosis discussed with patient.  He did not have post ictal symptoms that would be typical for seizure.  He did not have migraine symptoms otherwise.  He was not on aspirin when this most recent event happened so I would not consider him in aspirin failure.  I am concerned about possible TIA and I think it makes sense to see neurology.  I do not think this was related to previous vaccination.  He wanted to get a second opinion at the eye clinic and I think that makes sense.  That appears to be a completely separate issue.  He is statin intolerant but able to tolerate red yeast rice.  I will continue aspirin 81 mg a day and routine cautions given to patient if he has any return of any neurologic symptoms.  Refer to neurology.  He agrees with plan.  His blood pressure is reasonable and I do not want to induce hypotension.

## 2020-07-20 ENCOUNTER — Telehealth: Payer: Self-pay

## 2020-07-20 NOTE — Telephone Encounter (Signed)
Pt left v/m that pt was told by Dr Damita Dunnings to take a baby ASA every day. Pt said saw on news that now people over 50 should not take baby ASA. Pt wants to know if Dr Damita Dunnings wants pt to take a daily baby ASA or stop the baby ASA.    After review by Dr Damita Dunnings pt wants cb.

## 2020-07-21 NOTE — Telephone Encounter (Signed)
He has a reasonable question.  Would still continue.  That guideline doesn't apply in his case.  With h/o possible TIA, would still continue aspirin as long as there is not contraindication.  Thanks.

## 2020-07-21 NOTE — Telephone Encounter (Signed)
Spoke with patient and advised Dr. Damita Dunnings would like him to continue taking the baby ASA. Patient is okay with that and will start back.

## 2020-08-25 ENCOUNTER — Encounter: Payer: Self-pay | Admitting: Primary Care

## 2020-08-25 ENCOUNTER — Other Ambulatory Visit: Payer: Self-pay

## 2020-08-25 ENCOUNTER — Ambulatory Visit: Payer: Medicare Other | Admitting: Primary Care

## 2020-08-25 DIAGNOSIS — T148XXA Other injury of unspecified body region, initial encounter: Secondary | ICD-10-CM | POA: Diagnosis not present

## 2020-08-25 NOTE — Patient Instructions (Addendum)
You can take acetaminophen (Tylenol) 500 mg and ibuprofen 200 or 400 mg together, twice daily for the next five days for pain.   Rest your left arm when possible.  Continue ice/heat as needed.   It was a pleasure meeting you!

## 2020-08-25 NOTE — Assessment & Plan Note (Signed)
Muscle vs tendon strain to left upper extremity post heavy equipment maneuvering.   Exam today overall benign.  Discussed to resume Tylenol 500 mg and add Ibuprofen 200 mg BID x 5 days. Continue heat/ice, voltaren gel. We also discussed wrapping with an ACE bandage for support, also resting.   He will update if no improvement within a week or so.

## 2020-08-25 NOTE — Progress Notes (Signed)
Subjective:    Patient ID: Larry [name], male    DOB: 1929-05-09, 87 y.o.   MRN: [id number]  HPI  Larry [name] is a very pleasant 87 y.o. male patient of Dr. Damita Dunnings with a history of low back pain, joint pain who presents today with a chief complaint of extremity pain.  His pain is located to the left mid anterior forearm and posterior humeral region. Symptoms began 2-3 days ago. The day prior he had pulled/dragged his small riding lawn mower over to the side in his garage in an attempt to get to his tiller. The mower was not on and the brakes were locked.   He's been taking Tylenol inconsistently, Voltaren Gel, Ice/Heat with some improvement. He denies injury/trauma, pain just after moving his mower, erythema/wound, numbness, swelling. He doesn't notice much pain with movement unless lifting something heavier.    Review of Systems  Musculoskeletal: Positive for myalgias. Negative for arthralgias and joint swelling.  Skin: Negative for color change.         Past Medical History:  Diagnosis Date  . 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 Esophageal reflux   . Family history of diabetes mellitus   . History of chickenpox   . Hypertriglyceridemia   . Other and unspecified hyperlipidemia   . Pre-syncope   . Thrombocytopenia, unspecified ([place])   . Unspecified hypothyroidism     Social History   Socioeconomic History  . Marital status: Married    Spouse name: Not on file  . Number of children: 4  . Years of education: Not on file  . Highest education level: Not on file  Occupational History  . Occupation: Retired from [employer]: RETIRED  Tobacco Use  . Smoking status: Never Smoker  . Smokeless tobacco: Never Used  . Tobacco comment: smoked only as a teenage small amount  Vaping Use  . Vaping Use: Never used  Substance and Sexual Activity  . Alcohol use: No  . Drug use: No  . Sexual activity: Not on file  Other Topics Concern  . Not on file   Social History Narrative   Caffeine:  Coffee, tea, 2+ daily.   Diet:  Fish, chicken, vegetables   Regular exercise:  Yes, yard work, gardening   Married 1953   4 kids   Social Determinants of Health   Financial Resource Strain: Low Risk   . Difficulty of Paying Living Expenses: Not hard at all  Food Insecurity: No Food Insecurity  . Worried About Charity fundraiser in the Last Year: Never true  . Ran Out of Food in the Last Year: Never true  Transportation Needs: No Transportation Needs  . Lack of Transportation (Medical): No  . Lack of Transportation (Non-Medical): No  Physical Activity: Inactive  . Days of Exercise per Week: 0 days  . Minutes of Exercise per Session: 0 min  Stress: No Stress Concern Present  . Feeling of Stress : Not at all  Social Connections: Not on file  Intimate Partner Violence: Not At Risk  . Fear of Current or Ex-Partner: No  . Emotionally Abused: No  . Physically Abused: No  . Sexually Abused: No    Past Surgical History:  Procedure Laterality Date  . APPENDECTOMY  1949    Family History  Problem Relation Age of Onset  . Stroke Mother   . Diabetes Mother        borderline  . Pneumonia Father   .  Hypertension Sister   . Hyperlipidemia Sister   . Alzheimer's disease Brother   . Dementia Brother   . Liver disease Sister        cirrhosis, nondrinker  . Arthritis Other   . Diabetes Other        1st degree relative  . Hyperlipidemia Other   . Hypertension Other   . Prostate cancer Neg Hx   . Colon cancer Neg Hx     Allergies  Allergen Reactions  . Alpha-Gal Other (See Comments)  . Sulfur Itching  . Antihistamines, Diphenhydramine-Type     Urinary retention  . Atorvastatin     REACTION: Intolerance  . Beef-Derived Products     Tolerates chicken/fish/turkey  . Ezetimibe     REACTION: Intolerance  . Other     ALPHAGAL- see above re: beef allergy  . Pork-Derived Products Other (See Comments)    Due to alpha gal hx  .  Simvastatin     REACTION: Intolerance  . Sulfonamide Derivatives     REACTION: hives, itching  . Tramadol Other (See Comments)    Lightheaded    Current Outpatient Medications on File Prior to Visit  Medication Sig Dispense Refill  . ALPRAZolam (XANAX) 0.25 MG tablet Use 1/2 tab twice daily as needed. 30 tablet 1  . aspirin EC 81 MG tablet Take 1 tablet (81 mg total) by mouth daily. Swallow whole.    . calcium carbonate (TUMS - DOSED IN MG ELEMENTAL CALCIUM) 500 MG chewable tablet Chew 1 tablet by mouth daily as needed for indigestion or heartburn.    . Coenzyme Q10 (CO Q 10 PO) Take 1 capsule by mouth daily.    . diclofenac sodium (VOLTAREN) 1 % GEL Apply 2 g topically 4 (four) times daily as needed. 100 g 2  . EPINEPHrine 0.3 mg/0.3 mL IJ SOAJ injection INJECT 0.3 MLS (1 PEN) INTO THE MUSCLE ONCE. AS NEEDED 2 Device 0  . folic acid (FOLVITE) 1 MG tablet Take 1 mg by mouth daily.    Marland Kitchen KRILL OIL PO Take 1 tablet by mouth daily.    . methotrexate (RHEUMATREX) 2.5 MG tablet Take 2 tablets (5 mg total) by mouth once a week. Caution:Chemotherapy. Protect from light.    . Omega-3 Fatty Acids (FISH OIL PO) Take 1 tablet by mouth daily.    . Red Yeast Rice Extract (RED YEAST RICE PO) Take 1 tablet by mouth daily.    Marland Kitchen SYNTHROID 50 MCG tablet TAKE 1 TABLET BY MOUTH  DAILY BEFORE BREAKFAST 90 tablet 3   No current facility-administered medications on file prior to visit.    BP 134/62   Pulse 78   Temp 98.4 F (36.9 C) (Temporal)   Ht 5\' 5"  (1.651 m)   Wt 153 lb (69.4 kg)   SpO2 97%   BMI 25.46 kg/m  Objective:   Physical Exam Musculoskeletal:        General: No swelling, tenderness, deformity or signs of injury.       Arms:     Comments: No tenderness or abnormality to sites noted. 5/5 strength to bilateral upper extremities.   Skin:    General: Skin is warm and dry.     Findings: No erythema.  Neurological:     Mental Status: He is alert.           Assessment & Plan:       This visit occurred during the SARS-CoV-2 public health emergency.  Safety protocols were in place,  including screening questions prior to the visit, additional usage of staff PPE, and extensive cleaning of exam room while observing appropriate contact time as indicated for disinfecting solutions.

## 2020-09-11 ENCOUNTER — Other Ambulatory Visit: Payer: Self-pay | Admitting: Family Medicine

## 2020-09-11 MED ORDER — ANBESOL 10 % MT GEL: 1.0000 "application " | Freq: Every day | OROMUCOSAL | 0 refills | Status: DC | PRN

## 2020-09-13 ENCOUNTER — Telehealth: Payer: Self-pay | Admitting: Family Medicine

## 2020-09-13 NOTE — Telephone Encounter (Signed)
He was at his wife's appointment on 09/11/20.  I checked his mouth since he had been having some difficulty with irritation on the buccal membrane near the left upper posterior molar.  He talked with dental clinic previously.  He has been using salt water gargles and that helps some.  I checked his mouth.  I saw very minimal irritation of the area without any lesion or ulceration.  No other mass or ulceration noted in the mouth.  No thrush.  We talked about potentially using Anbesol locally for the discomfort but otherwise to have him follow-up with the dental clinic.  He can update me as needed.  I do not see any acute abnormality in the mouth otherwise.

## 2020-09-28 ENCOUNTER — Telehealth: Payer: Self-pay

## 2020-09-28 NOTE — Telephone Encounter (Signed)
Pt reports he thinks there was some miscommunication. He does not actually need a referral, he is just wanting to know which otolaryngologist because he has not been to one before. He did get hearing aids from [employer] about 15 years ago but he would like to see a "real hearing doctor" now. Hearing loss is not sudden but has noticed it is increasingly worsening and wants to get assessed. Advised pt a msg would be sent and this office will f/u. Pt appreciative and verbalized understanaing.

## 2020-09-28 NOTE — Telephone Encounter (Signed)
[facility] Night - Client TELEPHONE ADVICE RECORD [facility] Patient Name: Larry [name] [facility] Gender: Male DOB: 1929-04-20 Age: 87 Y 1 M 9 D Return Phone Number: [phone] (Primary) Address: City/ State/ Zip: [place] [address] Client [place] Night - Client Client Site [place] Physician Renford Dills - MD Contact Type Call Who Is Calling Patient / Member / Family / Caregiver Call Type Triage / Clinical Relationship To Patient Self Return Phone Number [phone] (Primary) Chief Complaint Hearing Loss Reason for Call Symptomatic / Request for [place] states, needing ear checked, referral for ENT. Pt is having hearing issues. Translation No Nurse Assessment Nurse: Jearld Pies, RN, Lovena Le Date/Time Eilene Ghazi Time): 09/28/2020 8:23:56 AM Confirm and document reason for call. If symptomatic, describe symptoms. ---Pt has hearing aids. Hearing loss in right ear has increased this morning. Denies pain, redness/swelling, fever, drainage, or any other symptoms at this time. Does the patient have any new or worsening symptoms? ---Yes Will a triage be completed? ---Yes Related visit to physician within the last 2 weeks? ---No Does the PT have any chronic conditions? (i.e. diabetes, asthma, this includes High risk factors for pregnancy, etc.) ---Unknown Is this a behavioral health or substance abuse call? ---No Guidelines Guideline Title Affirmed Question Affirmed Notes Nurse Date/Time (Eastern Time) Hearing Loss or Change [1] Hearing loss in one or both ears AND [2] sudden onset AND [3] present now Jake Bathe 09/28/2020 8:25:51 AM Disp. Time Eilene Ghazi Time) Disposition Final User 09/28/2020 8:29:34 AM See HCP within 4 Hours (or PCP triage) Yes Jearld Pies, RN, Lovena Le PLEASE NOTE: All timestamps contained within this report are represented as [place]  Standard Time. CONFIDENTIALTY NOTICE: This fax transmission is intended only for the addressee. It contains information that is legally privileged, confidential or otherwise protected from use or disclosure. If you are not the intended recipient, you are strictly prohibited from reviewing, disclosing, copying using or disseminating any of this information or taking any action in reliance on or regarding this information. If you have received this fax in error, please notify us immediately by telephone so that we can arrange for its return to [place]. Phone: [phone], Toll-Free: [phone], Fax: [phone] Page: 2 of 2 Call Id: [id number] [place] Disagree/Comply Comply Caller Understands Yes PreDisposition Call Doctor Care Advice Given Per Guideline SEE HCP (OR PCP TRIAGE) WITHIN 4 HOURS: * You become worse CALL BACK IF: Referrals REFERRED TO PCP OFFICE

## 2020-09-29 NOTE — Telephone Encounter (Signed)
[facility] and [facility] both do a great job.  If he can see anyone at either clinic, then they should be able to help him out.  They all do a great job.  Thanks.

## 2020-09-29 NOTE — Telephone Encounter (Signed)
Pt advised of Dr. Josefine Class comments/ recommendations

## 2020-12-10 ENCOUNTER — Ambulatory Visit: Payer: Medicare Other | Admitting: Family Medicine

## 2020-12-22 ENCOUNTER — Other Ambulatory Visit: Payer: Self-pay | Admitting: Family Medicine

## 2021-02-01 ENCOUNTER — Ambulatory Visit (INDEPENDENT_AMBULATORY_CARE_PROVIDER_SITE_OTHER): Payer: Medicare Other | Admitting: Family Medicine

## 2021-02-01 ENCOUNTER — Other Ambulatory Visit: Payer: Self-pay

## 2021-02-01 ENCOUNTER — Encounter: Payer: Self-pay | Admitting: Family Medicine

## 2021-02-01 VITALS — BP 140/84 | HR 81 | Temp 97.8°F | Ht 65.0 in | Wt 146.0 lb

## 2021-02-01 DIAGNOSIS — E785 Hyperlipidemia, unspecified: Secondary | ICD-10-CM | POA: Diagnosis not present

## 2021-02-01 DIAGNOSIS — Z0000 Encounter for general adult medical examination without abnormal findings: Secondary | ICD-10-CM | POA: Diagnosis not present

## 2021-02-01 DIAGNOSIS — Z7189 Other specified counseling: Secondary | ICD-10-CM

## 2021-02-01 DIAGNOSIS — D696 Thrombocytopenia, unspecified: Secondary | ICD-10-CM | POA: Diagnosis not present

## 2021-02-01 DIAGNOSIS — E039 Hypothyroidism, unspecified: Secondary | ICD-10-CM | POA: Diagnosis not present

## 2021-02-01 DIAGNOSIS — F418 Other specified anxiety disorders: Secondary | ICD-10-CM

## 2021-02-01 DIAGNOSIS — Z91018 Allergy to other foods: Secondary | ICD-10-CM

## 2021-02-01 MED ORDER — ALPRAZOLAM 0.25 MG PO TABS: ORAL_TABLET | ORAL | 1 refills | Status: DC

## 2021-02-01 NOTE — Progress Notes (Signed)
This visit occurred during the SARS-CoV-2 public health emergency.  Safety protocols were in place, including screening questions prior to the visit, additional usage of staff PPE, and extensive cleaning of exam room while observing appropriate contact time as indicated for disinfecting solutions.  I have personally reviewed the Medicare Annual Wellness questionnaire and have noted 1. The patient's medical and social history 2. Their use of alcohol, tobacco or illicit drugs 3. Their current medications and supplements 4. The patient's functional ability including ADL's, fall risks, home safety risks and hearing or visual             impairment. 5. Diet and physical activities 6. Evidence for depression or mood disorders  The patients weight, height, BMI have been recorded in the chart and visual acuity is per eye clinic.  I have made referrals, counseling and provided education to the patient based review of the above and I have provided the pt with a written personalized care plan for preventive services.  Provider list updated- see scanned forms.  Routine anticipatory guidance given to patient.  See health maintenance. The possibility exists that previously documented standard health maintenance information may have been brought forward from a previous encounter into this note.  If needed, that same information has been updated to reflect the current situation based on today's encounter.    Flu up-to-date Shingrix d/w pt. previously had Zostavax. PNA up-to-date Tetanus 2015 COVID-vaccine previously done Colon cancer screening not due given his age.  He agrees. Prostate cancer screening not due given his age.  He agrees. Advance directive-wife designated if patient were incapacitated. Cognitive function addressed- see scanned forms- and if abnormal then additional documentation follows.   In addition to [facility] Wellness, follow up visit for the below conditions:  H/o alpha gal. He  avoids trigger foods.  No sx. routine cautions given to patient.  Hypothyroidism. Compliant.  TSH pending.  No dysphagia.  See notes on labs.  H/o low platelets.  Cbc pending.  Still on MTX per derm.  No bleeding.  See notes on labs.  H/o rare use of BZD for anxiety.  Used with relief.  No recent use.  Prev used at dental visit.    He had dermatology visit recently.  He has 1 suture remaining in the tip of the nose and he needed that removed.  See exam below.  PMH and SH reviewed  Meds, vitals, and allergies reviewed.   ROS: Per HPI.  Unless specifically indicated otherwise in HPI, the patient denies:  General: fever. Eyes: acute vision changes ENT: sore throat Cardiovascular: chest pain Respiratory: SOB GI: vomiting GU: dysuria Musculoskeletal: acute back pain Derm: acute rash Neuro: acute motor dysfunction Psych: worsening mood Endocrine: polydipsia Heme: bleeding Allergy: hayfever  GEN: nad, alert and oriented HEENT: ncat NECK: supple w/o LA CV: rrr. PULM: ctab, no inc wob ABD: soft, +bs EXT: no edema SKIN: no acute rash Single suture removed from tip of nose.  Tolerated well.  No complications.  Good tissue apposition.  He consented for removal.

## 2021-02-01 NOTE — Patient Instructions (Signed)
Go to the lab on the way out.   If you have mychart we'll likely use that to update you.    °Take care.  Glad to see you. °Update me as needed.   °

## 2021-02-02 LAB — CBC WITH DIFFERENTIAL/PLATELET
Basophils Absolute: 0 10*3/uL (ref 0.0–0.1)
Basophils Relative: 0.8 % (ref 0.0–3.0)
Eosinophils Absolute: 0.1 10*3/uL (ref 0.0–0.7)
Eosinophils Relative: 1.3 % (ref 0.0–5.0)
HCT: 46.9 % (ref 39.0–52.0)
Hemoglobin: 15.8 g/dL (ref 13.0–17.0)
Lymphocytes Relative: 16.5 % (ref 12.0–46.0)
Lymphs Abs: 0.9 10*3/uL (ref 0.7–4.0)
MCHC: 33.8 g/dL (ref 30.0–36.0)
MCV: 93.9 fl (ref 78.0–100.0)
Monocytes Absolute: 0.4 10*3/uL (ref 0.1–1.0)
Monocytes Relative: 7.3 % (ref 3.0–12.0)
Neutro Abs: 3.8 10*3/uL (ref 1.4–7.7)
Neutrophils Relative %: 74.1 % (ref 43.0–77.0)
Platelets: 146 10*3/uL — ABNORMAL LOW (ref 150.0–400.0)
RBC: 5 Mil/uL (ref 4.22–5.81)
RDW: 13.8 % (ref 11.5–15.5)
WBC: 5.2 10*3/uL (ref 4.0–10.5)

## 2021-02-02 LAB — COMPREHENSIVE METABOLIC PANEL
ALT: 22 U/L (ref 0–53)
AST: 25 U/L (ref 0–37)
Albumin: 4.6 g/dL (ref 3.5–5.2)
Alkaline Phosphatase: 79 U/L (ref 39–117)
BUN: 12 mg/dL (ref 6–23)
CO2: 27 mEq/L (ref 19–32)
Calcium: 9.4 mg/dL (ref 8.4–10.5)
Chloride: 98 mEq/L (ref 96–112)
Creatinine, Ser: 0.82 mg/dL (ref 0.40–1.50)
GFR: 77.91 mL/min (ref >60.00)
Glucose, Bld: 94 mg/dL (ref 70–99)
Potassium: 4.3 mEq/L (ref 3.5–5.1)
Sodium: 135 mEq/L (ref 135–145)
Total Bilirubin: 0.8 mg/dL (ref 0.2–1.2)
Total Protein: 6.8 g/dL (ref 6.0–8.3)

## 2021-02-02 LAB — LIPID PANEL
Cholesterol: 261 mg/dL — ABNORMAL HIGH (ref 0–200)
HDL: 53.5 mg/dL (ref >39.00)
LDL Cholesterol: 184 mg/dL — ABNORMAL HIGH (ref 0–99)
NonHDL: 207.05
Total CHOL/HDL Ratio: 5
Triglycerides: 114 mg/dL (ref 0.0–149.0)
VLDL: 22.8 mg/dL (ref 0.0–40.0)

## 2021-02-02 LAB — TSH: TSH: 2.25 u[IU]/mL (ref 0.35–5.50)

## 2021-02-03 NOTE — Assessment & Plan Note (Signed)
No symptoms.  He avoids mammalian meat.  Routine cautions given to patient.

## 2021-02-03 NOTE — Assessment & Plan Note (Signed)
Rare use of benzodiazepine.  Prescription sent.  Routine cautions given.  He will update me as needed.

## 2021-02-03 NOTE — Assessment & Plan Note (Signed)
Advance directive- wife designated if patient were incapacitated.  

## 2021-02-03 NOTE — Assessment & Plan Note (Signed)
History of.  No bleeding.  On methotrexate per dermatology.  See notes on labs.

## 2021-02-03 NOTE — Assessment & Plan Note (Signed)
Flu up-to-date Shingrix d/w pt. previously had Zostavax. PNA up-to-date Tetanus 2015 COVID-vaccine previously done Colon cancer screening not due given his age.  He agrees. Prostate cancer screening not due given his age.  He agrees. Advance directive-wife designated if patient were incapacitated. Cognitive function addressed- see scanned forms- and if abnormal then additional documentation follows.

## 2021-02-03 NOTE — Assessment & Plan Note (Signed)
Compliant with Synthroid.  No thyromegaly noted on exam.  See notes on TSH.  Continue Synthroid.

## 2021-02-08 ENCOUNTER — Telehealth: Payer: Self-pay | Admitting: Family Medicine

## 2021-02-08 NOTE — Telephone Encounter (Signed)
Spoke with patient about lab results. See result note.

## 2021-02-08 NOTE — Telephone Encounter (Signed)
Pt returning call for lab results . Would like a call back [phone]

## 2021-02-09 NOTE — Telephone Encounter (Signed)
Pt would like a copy of his lab results mailed to him

## 2021-02-09 NOTE — Telephone Encounter (Signed)
Results have been placed in mail; and left message on VM this was done.

## 2021-02-15 NOTE — Telephone Encounter (Addendum)
Pt want to discuss lab results with provider at his earliest convenience  [phone]

## 2021-02-16 ENCOUNTER — Telehealth: Payer: Self-pay

## 2021-02-16 NOTE — Telephone Encounter (Signed)
[name] TELEPHONE ADVICE RECORD [facility] Patient Name: Larry [name] [facility] Gender: Male DOB: 05-09-1929 Age: 87 Y 66 M 27 D Return Phone Number: [phone] (Primary), [phone] (Secondary) Address: City/ State/ Zip: [place] [address] Client [name] Client Site [place] Provider Renford Dills - MD Contact Type Call Who Is Calling Patient / Member / Family / Caregiver Call Type Triage / Clinical Relationship To Patient Self Return Phone Number [phone] (Primary) Chief Complaint Health information question (non symptomatic) Reason for Call Request to Speak to a Physician Initial Comment Caller states his results for a physical mentioned high cholesterol. 260, usually around 200. Patient called office last week but no call back. Patient is asking to speak with triage nurse specifically. He has already left a message to the office. Translation No Nurse Assessment Nurse: Leilani Merl, RN, Heather Date/Time (Eastern Time): 02/16/2021 9:19:15 AM Confirm and document reason for call. If symptomatic, describe symptoms. ---Caller states his results for a physical mentioned high cholesterol. 260, usually around 200. He has not been doing anything differently. He is not able to take statins. The last time he had his blood drawn, he was told to only fast for 4 hours, he does not think that is long enough. Does the patient have any new or worsening symptoms? ---No Please document clinical information provided and list any resource used. ---Caller is wondering what else he can do for his cholesterol without statins. Caller given information from website, [email or web address]. Caller verbalizes understanding. Disp. Time Eilene Ghazi Time) Disposition Final User 02/16/2021 9:40:57 AM Clinical Call Yes Standifer, RN, Caleen Essex

## 2021-02-17 NOTE — Telephone Encounter (Signed)
Spoke with patient about below and patient verbalized understanding.

## 2021-02-17 NOTE — Telephone Encounter (Signed)
Please check with patient.  Given all of the factors (prev lipids were reasonable, high recent LDL, statin intolerance, reasonable TG level), I am hesitant for him to start other lipid lower agents and I would continue work on diet and exercise for lipid management.  Thanks.

## 2021-07-27 ENCOUNTER — Telehealth: Payer: Self-pay | Admitting: Family Medicine

## 2021-07-27 NOTE — Telephone Encounter (Signed)
Called patient back but he was not home and I spoke with his wife. She was wanting to know about the automatic pill dispensers that lock and dispense meds. I advised her that I do not know much about those to advise it they would be go or not. I directed her to maybe speak with their pharmacy about options. Some pharmacies can make pill packets for them. Patients wife verbalized understanding and will discuss with pharmacy about options for their meds.  ?

## 2021-07-27 NOTE — Telephone Encounter (Signed)
Pt called and said there are automatic pill dispensers online and he was wanting to know if the nurse could call him back because he had some questions. Call back is [phone] ?

## 2021-08-26 ENCOUNTER — Emergency Department (HOSPITAL_COMMUNITY): Payer: Medicare Other

## 2021-08-26 ENCOUNTER — Inpatient Hospital Stay (HOSPITAL_COMMUNITY): Payer: Medicare Other

## 2021-08-26 ENCOUNTER — Observation Stay (HOSPITAL_COMMUNITY)
Admission: EM | Admit: 2021-08-26 | Discharge: 2021-08-27 | Disposition: A | Discharge status: Home / Self Care | Payer: Medicare Other | Attending: Family Medicine | Admitting: Family Medicine

## 2021-08-26 ENCOUNTER — Other Ambulatory Visit: Payer: Self-pay

## 2021-08-26 DIAGNOSIS — K219 Gastro-esophageal reflux disease without esophagitis: Secondary | ICD-10-CM | POA: Diagnosis not present

## 2021-08-26 DIAGNOSIS — Z91018 Allergy to other foods: Secondary | ICD-10-CM | POA: Diagnosis present

## 2021-08-26 DIAGNOSIS — Z8673 Personal history of transient ischemic attack (TIA), and cerebral infarction without residual deficits: Secondary | ICD-10-CM | POA: Diagnosis not present

## 2021-08-26 DIAGNOSIS — Z823 Family history of stroke: Secondary | ICD-10-CM | POA: Insufficient documentation

## 2021-08-26 DIAGNOSIS — Z79899 Other long term (current) drug therapy: Secondary | ICD-10-CM | POA: Diagnosis not present

## 2021-08-26 DIAGNOSIS — D696 Thrombocytopenia, unspecified: Secondary | ICD-10-CM | POA: Diagnosis not present

## 2021-08-26 DIAGNOSIS — Z79890 Hormone replacement therapy (postmenopausal): Secondary | ICD-10-CM | POA: Insufficient documentation

## 2021-08-26 DIAGNOSIS — E785 Hyperlipidemia, unspecified: Secondary | ICD-10-CM | POA: Diagnosis not present

## 2021-08-26 DIAGNOSIS — R2689 Other abnormalities of gait and mobility: Secondary | ICD-10-CM | POA: Diagnosis not present

## 2021-08-26 DIAGNOSIS — E039 Hypothyroidism, unspecified: Secondary | ICD-10-CM | POA: Insufficient documentation

## 2021-08-26 DIAGNOSIS — R2681 Unsteadiness on feet: Secondary | ICD-10-CM | POA: Insufficient documentation

## 2021-08-26 DIAGNOSIS — I639 Cerebral infarction, unspecified: Principal | ICD-10-CM | POA: Insufficient documentation

## 2021-08-26 DIAGNOSIS — Z888 Allergy status to other drugs, medicaments and biological substances status: Secondary | ICD-10-CM | POA: Diagnosis not present

## 2021-08-26 DIAGNOSIS — I6622 Occlusion and stenosis of left posterior cerebral artery: Secondary | ICD-10-CM | POA: Insufficient documentation

## 2021-08-26 DIAGNOSIS — Z20822 Contact with and (suspected) exposure to covid-19: Secondary | ICD-10-CM | POA: Insufficient documentation

## 2021-08-26 DIAGNOSIS — Z818 Family history of other mental and behavioral disorders: Secondary | ICD-10-CM | POA: Insufficient documentation

## 2021-08-26 DIAGNOSIS — R29701 NIHSS score 1: Secondary | ICD-10-CM | POA: Insufficient documentation

## 2021-08-26 DIAGNOSIS — R7989 Other specified abnormal findings of blood chemistry: Secondary | ICD-10-CM | POA: Diagnosis not present

## 2021-08-26 DIAGNOSIS — R41841 Cognitive communication deficit: Secondary | ICD-10-CM | POA: Insufficient documentation

## 2021-08-26 DIAGNOSIS — R4701 Aphasia: Secondary | ICD-10-CM | POA: Insufficient documentation

## 2021-08-26 DIAGNOSIS — E781 Pure hyperglyceridemia: Secondary | ICD-10-CM | POA: Insufficient documentation

## 2021-08-26 DIAGNOSIS — R410 Disorientation, unspecified: Secondary | ICD-10-CM | POA: Insufficient documentation

## 2021-08-26 DIAGNOSIS — R519 Headache, unspecified: Secondary | ICD-10-CM | POA: Diagnosis not present

## 2021-08-26 DIAGNOSIS — R414 Neurologic neglect syndrome: Secondary | ICD-10-CM | POA: Insufficient documentation

## 2021-08-26 DIAGNOSIS — R2990 Unspecified symptoms and signs involving the nervous system: Secondary | ICD-10-CM | POA: Diagnosis not present

## 2021-08-26 DIAGNOSIS — R29818 Other symptoms and signs involving the nervous system: Secondary | ICD-10-CM | POA: Diagnosis not present

## 2021-08-26 DIAGNOSIS — Z8249 Family history of ischemic heart disease and other diseases of the circulatory system: Secondary | ICD-10-CM | POA: Insufficient documentation

## 2021-08-26 DIAGNOSIS — Z7982 Long term (current) use of aspirin: Secondary | ICD-10-CM | POA: Diagnosis not present

## 2021-08-26 LAB — RAPID URINE DRUG SCREEN, HOSP PERFORMED
Amphetamines: NOT DETECTED
Barbiturates: NOT DETECTED
Benzodiazepines: NOT DETECTED
Cocaine: NOT DETECTED
Opiates: NOT DETECTED
Tetrahydrocannabinol: NOT DETECTED

## 2021-08-26 LAB — DIFFERENTIAL
Abs Immature Granulocytes: 0.01 10*3/uL (ref 0.00–0.07)
Basophils Absolute: 0 10*3/uL (ref 0.0–0.1)
Basophils Relative: 1 %
Eosinophils Absolute: 0 10*3/uL (ref 0.0–0.5)
Eosinophils Relative: 1 %
Immature Granulocytes: 0 %
Lymphocytes Relative: 17 %
Lymphs Abs: 0.8 10*3/uL (ref 0.7–4.0)
Monocytes Absolute: 0.3 10*3/uL (ref 0.1–1.0)
Monocytes Relative: 6 %
Neutro Abs: 3.6 10*3/uL (ref 1.7–7.7)
Neutrophils Relative %: 75 %

## 2021-08-26 LAB — COMPREHENSIVE METABOLIC PANEL
ALT: 14 U/L (ref 0–44)
AST: 23 U/L (ref 15–41)
Albumin: 3.5 g/dL (ref 3.5–5.0)
Alkaline Phosphatase: 53 U/L (ref 38–126)
Anion gap: 9 (ref 5–15)
BUN: 9 mg/dL (ref 8–23)
CO2: 24 mmol/L (ref 22–32)
Calcium: 8.1 mg/dL — ABNORMAL LOW (ref 8.9–10.3)
Chloride: 100 mmol/L (ref 98–111)
Creatinine, Ser: 0.74 mg/dL (ref 0.61–1.24)
GFR, Estimated: >60 mL/min (ref >60)
Glucose, Bld: 163 mg/dL — ABNORMAL HIGH (ref 70–99)
Potassium: 3.9 mmol/L (ref 3.5–5.1)
Sodium: 133 mmol/L — ABNORMAL LOW (ref 135–145)
Total Bilirubin: 0.7 mg/dL (ref 0.3–1.2)
Total Protein: 5.9 g/dL — ABNORMAL LOW (ref 6.5–8.1)

## 2021-08-26 LAB — PROTIME-INR
INR: 1 (ref 0.8–1.2)
Prothrombin Time: 13.5 seconds (ref 11.4–15.2)

## 2021-08-26 LAB — TSH: TSH: 1.901 u[IU]/mL (ref 0.350–4.500)

## 2021-08-26 LAB — URINALYSIS, ROUTINE W REFLEX MICROSCOPIC
Bilirubin Urine: NEGATIVE
Glucose, UA: 50 mg/dL — AB
Hgb urine dipstick: NEGATIVE
Ketones, ur: NEGATIVE mg/dL
Leukocytes,Ua: NEGATIVE
Nitrite: NEGATIVE
Protein, ur: NEGATIVE mg/dL
Specific Gravity, Urine: 1.021 (ref 1.005–1.030)
pH: 7 (ref 5.0–8.0)

## 2021-08-26 LAB — CBC
HCT: 43 % (ref 39.0–52.0)
Hemoglobin: 15 g/dL (ref 13.0–17.0)
MCH: 33.3 pg (ref 26.0–34.0)
MCHC: 34.9 g/dL (ref 30.0–36.0)
MCV: 95.3 fL (ref 80.0–100.0)
Platelets: 119 10*3/uL — ABNORMAL LOW (ref 150–400)
RBC: 4.51 MIL/uL (ref 4.22–5.81)
RDW: 13.7 % (ref 11.5–15.5)
WBC: 4.7 10*3/uL (ref 4.0–10.5)
nRBC: 0 % (ref 0.0–0.2)

## 2021-08-26 LAB — I-STAT CHEM 8, ED
BUN: 10 mg/dL (ref 8–23)
Calcium, Ion: 1.07 mmol/L — ABNORMAL LOW (ref 1.15–1.40)
Chloride: 97 mmol/L — ABNORMAL LOW (ref 98–111)
Creatinine, Ser: 0.7 mg/dL (ref 0.61–1.24)
Glucose, Bld: 165 mg/dL — ABNORMAL HIGH (ref 70–99)
HCT: 43 % (ref 39.0–52.0)
Hemoglobin: 14.6 g/dL (ref 13.0–17.0)
Potassium: 3.7 mmol/L (ref 3.5–5.1)
Sodium: 133 mmol/L — ABNORMAL LOW (ref 135–145)
TCO2: 24 mmol/L (ref 22–32)

## 2021-08-26 LAB — APTT: aPTT: 27 seconds (ref 24–36)

## 2021-08-26 LAB — ETHANOL: Alcohol, Ethyl (B): <10 mg/dL (ref <10)

## 2021-08-26 LAB — CBG MONITORING, ED: Glucose-Capillary: 181 mg/dL — ABNORMAL HIGH (ref 70–99)

## 2021-08-26 MED ORDER — STROKE: EARLY STAGES OF RECOVERY BOOK
Freq: Once | Status: AC
  Filled 2021-08-26: qty 1

## 2021-08-26 MED ORDER — MELATONIN 3 MG PO TABS
3.0000 mg | ORAL_TABLET | Freq: Every evening | ORAL | Status: DC | PRN
  Administered 2021-08-26: 3 mg via ORAL
  Filled 2021-08-26: qty 1

## 2021-08-26 MED ORDER — HALOPERIDOL LACTATE 5 MG/ML IJ SOLN: 1.0000 mg | Freq: Once | INTRAMUSCULAR | Status: DC

## 2021-08-26 MED ORDER — LORAZEPAM 2 MG/ML IJ SOLN: 1.0000 mg | Freq: Once | INTRAMUSCULAR | Status: DC

## 2021-08-26 MED ORDER — ACETAMINOPHEN 325 MG PO TABS: 650.0000 mg | ORAL_TABLET | Freq: Four times a day (QID) | ORAL | Status: DC | PRN

## 2021-08-26 MED ORDER — ACETAMINOPHEN 650 MG RE SUPP: 650.0000 mg | Freq: Four times a day (QID) | RECTAL | Status: DC | PRN

## 2021-08-26 MED ORDER — SODIUM CHLORIDE 0.9% FLUSH
3.0000 mL | Freq: Two times a day (BID) | INTRAVENOUS | Status: DC
  Administered 2021-08-26 – 2021-08-27 (×2): 3 mL via INTRAVENOUS

## 2021-08-26 MED ORDER — CALCIUM CARBONATE ANTACID 500 MG PO CHEW
1.0000 | CHEWABLE_TABLET | Freq: Every day | ORAL | Status: DC | PRN
Start: 2021-08-26 — End: 2021-08-27

## 2021-08-26 MED ORDER — ASPIRIN 300 MG RE SUPP: 300.0000 mg | Freq: Every day | RECTAL | Status: DC

## 2021-08-26 MED ORDER — SODIUM CHLORIDE 0.9% FLUSH: 3.0000 mL | INTRAVENOUS | Status: DC | PRN

## 2021-08-26 MED ORDER — SODIUM CHLORIDE 0.9 % IV SOLN: 250.0000 mL | INTRAVENOUS | Status: DC | PRN

## 2021-08-26 MED ORDER — LEVOTHYROXINE SODIUM 50 MCG PO TABS
50.0000 ug | ORAL_TABLET | Freq: Every day | ORAL | Status: DC
Start: 2021-08-27 — End: 2021-08-27
  Administered 2021-08-27: 50 ug via ORAL
  Filled 2021-08-26: qty 1

## 2021-08-26 MED ORDER — IOHEXOL 350 MG/ML SOLN
100.0000 mL | Freq: Once | INTRAVENOUS | Status: AC | PRN
  Administered 2021-08-26: 100 mL via INTRAVENOUS

## 2021-08-26 MED ORDER — ASPIRIN 325 MG PO TABS
325.0000 mg | ORAL_TABLET | Freq: Every day | ORAL | Status: DC
  Administered 2021-08-26 – 2021-08-27 (×2): 325 mg via ORAL
  Filled 2021-08-26 (×2): qty 1

## 2021-08-26 NOTE — Code Documentation (Signed)
Stroke Response Nurse Documentation Code Documentation  Taryll Reichenberger is a 88 y.o. male arriving to [facility]  via [facility] on 08/26/2021 with past medical hx of hypothyroidism, hyperlipidemia. On No antithrombotic. Code stroke was activated by ED.   Patient from home where he was LKW at 08/25/2021 2000 when he went to bed and now complaining of aphasia and headache. Headache started yesterday and aphasia was noted by wife this morning when he woke up.   Stroke team met patient in CT. NIHSS 1, see documentation for details and code stroke times. Patient with Expressive aphasia  on exam. The following imaging was completed:  CT Head, CTA, and CTP. Patient is not a candidate for IV Thrombolytic due to out of window. Patient is not a candidate for IR due to no LVO.   Bedside handoff with ED RN.    Leverne Humbles Stroke Response RN

## 2021-08-26 NOTE — Assessment & Plan Note (Signed)
Appears to be statin intolerant. Does not look like he has tried crestor  LDL was 184 in 01/2021, fasting panel pending  Goal LDL <70. Consider zetia or trial crestor or PCSK9 inhibitor

## 2021-08-26 NOTE — Assessment & Plan Note (Addendum)
Continue MTX. Takes weekly on Wednesday

## 2021-08-26 NOTE — ED Provider Notes (Signed)
[facility] EMERGENCY DEPARTMENT Provider Note   CSN: [id number] Arrival date & time: 08/26/21  1357     History  Chief Complaint  Patient presents with   Stroke Symptoms    Larry [name] is a 88 y.o. male.  Level 5 caveat for aphasia.  Patient arrives via EMS with concern for stroke.  Reporting to have difficulty speaking and word finding difficulty this morning.  Last seen normal was last night at 8 PM.  He has a headache during the day yesterday at about noon which seem to improve with aspirin.  He states the headache is still mildly there.  Today he is having word finding difficulties and difficulty saying what he wants to say.  Also having some neglect on arrival and code stroke was activated. He states history of TIA in the past.  No blood thinner use other than ASA.  No chest pain or back pain.  No shortness of breath or abdominal pain  The history is provided by the patient and the EMS personnel. The history is limited by the condition of the patient.       Home Medications Prior to Admission medications   Medication Sig Start Date End Date Taking? Authorizing Provider  ALPRAZolam Duanne Moron) 0.25 MG tablet Use 1/2 tab twice daily as needed. 02/01/21   Tonia Ghent, MD  aspirin EC 81 MG tablet Take 1 tablet (81 mg total) by mouth daily. Swallow whole. 03/09/20   Tonia Ghent, MD  benzocaine (ANBESOL) 10 % mucosal gel Use as directed 1 application in the mouth or throat daily as needed for mouth pain. 09/11/20   Tonia Ghent, MD  calcium carbonate (TUMS - DOSED IN MG ELEMENTAL CALCIUM) 500 MG chewable tablet Chew 1 tablet by mouth daily as needed for indigestion or heartburn.    [provider]  Coenzyme Q10 (CO Q 10 PO) Take 1 capsule by mouth daily.    [provider]  diclofenac sodium (VOLTAREN) 1 % GEL Apply 2 g topically 4 (four) times daily as needed. 02/15/17   Tonia Ghent, MD  EPINEPHrine 0.3 mg/0.3 mL IJ SOAJ  injection INJECT 0.3 MLS (1 PEN) INTO THE MUSCLE ONCE. AS NEEDED 02/14/18   Tonia Ghent, MD  folic acid (FOLVITE) 1 MG tablet Take 1 mg by mouth daily.    [provider]  KRILL OIL PO Take 1 tablet by mouth daily.    [provider]  methotrexate (RHEUMATREX) 2.5 MG tablet Take 2 tablets (5 mg total) by mouth once a week. Caution:Chemotherapy. Protect from light. 12/18/17   Tonia Ghent, MD  Omega-3 Fatty Acids (FISH OIL PO) Take 1 tablet by mouth daily.    [provider]  Red Yeast Rice Extract (RED YEAST RICE PO) Take 1 tablet by mouth daily.    [provider]  SYNTHROID 50 MCG tablet TAKE 1 TABLET BY MOUTH  DAILY BEFORE BREAKFAST 12/23/20   Tonia Ghent, MD      Allergies    Alpha-gal; Sulfur; Antihistamines, diphenhydramine-type; Atorvastatin; Beef-derived products; Ezetimibe; Other; Pork-derived products; Simvastatin; Sulfonamide derivatives; and Tramadol    Review of Systems   Review of Systems  Unable to perform ROS: Acuity of condition    Physical Exam Updated Vital Signs There were no vitals taken for this visit. Physical Exam Vitals and nursing note reviewed.  Constitutional:      General: He is not in acute distress.    Appearance: He is  well-developed.  HENT:     Head: Normocephalic and atraumatic.     Mouth/Throat:     Pharynx: No oropharyngeal exudate.  Eyes:     Conjunctiva/sclera: Conjunctivae normal.     Pupils: Pupils are equal, round, and reactive to light.  Neck:     Comments: No meningismus. Cardiovascular:     Rate and Rhythm: Normal rate and regular rhythm.     Heart sounds: Normal heart sounds. No murmur heard. Pulmonary:     Effort: Pulmonary effort is normal. No respiratory distress.     Breath sounds: Normal breath sounds.  Abdominal:     Palpations: Abdomen is soft.     Tenderness: There is no abdominal tenderness. There is no guarding or rebound.  Musculoskeletal:        General: No tenderness.  Normal range of motion.     Cervical back: Normal range of motion and neck supple.  Skin:    General: Skin is warm.  Neurological:     Mental Status: He is alert and oriented to person, place, and time.     Cranial Nerves: No cranial nerve deficit.     Motor: No abnormal muscle tone.     Coordination: Coordination normal.     Comments: Some expressive aphasia.  Able to answer orientation questions correctly.  Cranial nerves II to XII intact.  5/5 strength throughout.  Difficulty following commands on finger-to-nose and appears to have some left-sided neglect. Able to raise arms and legs off the bed bilaterally  Psychiatric:        Behavior: Behavior normal.     ED Results / Procedures / Treatments   Labs (all labs ordered are listed, but only abnormal results are displayed) Labs Reviewed  CBC - Abnormal; Notable for the following components:      Result Value   Platelets 119 (*)    All other components within normal limits  COMPREHENSIVE METABOLIC PANEL - Abnormal; Notable for the following components:   Sodium 133 (*)    Glucose, Bld 163 (*)    Calcium 8.1 (*)    Total Protein 5.9 (*)    All other components within normal limits  I-STAT CHEM 8, ED - Abnormal; Notable for the following components:   Sodium 133 (*)    Chloride 97 (*)    Glucose, Bld 165 (*)    Calcium, Ion 1.07 (*)    All other components within normal limits  CBG MONITORING, ED - Abnormal; Notable for the following components:   Glucose-Capillary 181 (*)    All other components within normal limits  RESP PANEL BY RT-PCR (FLU A&B, COVID) ARPGX2  ETHANOL  PROTIME-INR  APTT  DIFFERENTIAL  RAPID URINE DRUG SCREEN, HOSP PERFORMED  URINALYSIS, ROUTINE W REFLEX MICROSCOPIC  TSH  LIPID PANEL  HEMOGLOBIN C3J  BASIC METABOLIC PANEL  CBC    EKG EKG Interpretation  Date/Time:  Thursday August 26 2021 14:07:48 EDT Ventricular Rate:  68 PR Interval:  156 QRS Duration: 103 QT Interval:  422 QTC  Calculation: 449 R Axis:   16 Text Interpretation: Sinus rhythm No significant change was found Confirmed by Ezequiel Essex [phone]) on 08/26/2021 2:20:46 PM  Radiology CT ANGIO HEAD NECK W WO CM W PERF (CODE STROKE)  Result Date: 08/26/2021 CLINICAL DATA:  Neuro deficit, acute, stroke suspected EXAM: CT ANGIOGRAPHY HEAD AND NECK CT PERFUSION BRAIN TECHNIQUE: Multidetector CT imaging of the head and neck was performed using the standard protocol during bolus administration of intravenous contrast.  Multiplanar CT image reconstructions and MIPs were obtained to evaluate the vascular anatomy. Carotid stenosis measurements (when applicable) are obtained utilizing NASCET criteria, using the distal internal carotid diameter as the denominator. Multiphase CT imaging of the brain was performed following IV bolus contrast injection. Subsequent parametric perfusion maps were calculated using RAPID software. RADIATION DOSE REDUCTION: This exam was performed according to the departmental dose-optimization program which includes automated exposure control, adjustment of the mA and/or kV according to patient size and/or use of iterative reconstruction technique. CONTRAST:  15m OMNIPAQUE IOHEXOL 350 MG/ML SOLN COMPARISON:  None Available. FINDINGS: CTA NECK FINDINGS Aortic arch: Great vessel origins are patent without significant stenosis. Right carotid system: No evidence of dissection, stenosis (50% or greater) or occlusion. Left carotid system: No evidence of dissection, stenosis (50% or greater) or occlusion. Vertebral arteries: Codominant. No evidence of dissection, stenosis (50% or greater) or occlusion. Skeleton: Moderate multilevel degenerative change. Other neck: No acute abnormality. Upper chest: Biapical pleuroparenchymal scarring. Visualized lung apices are otherwise clear. Review of the MIP images confirms the above findings CTA HEAD FINDINGS Anterior circulation: Bilateral intracranial ICAs, MCAs, and ACAs are  patent without proximal hemodynamically significant stenosis. Hypoplastic right A1 ACA comparable congenital. Small (1-2 mm) inferiorly directed outpouching arising from the supraclinoid left ICA (series 8, image 102; series 10, image 23), compatible with small aneurysm versus infundibulum with vessel not well seen. Posterior circulation: Bilateral intradural vertebral arteries, basilar artery, and bilateral posterior cerebral arteries are patent. Moderate stenosis of the proximal left P2 PCA. CT Brain Perfusion Findings: ASPECTS: 10 CBF (<30%) Volume: 068mPerfusion (Tmax>6.0s) volume: 59m559mismatch Volume: 59mL51mfarction Location:None identified. IMPRESSION: 1. No emergent large vessel occlusion. 2. Moderate left P2 PCA stenosis. 3. No evidence of core infarct or penumbra on perfusion. 4. 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 Electronically Signed   By: FredMargaretha Sheffield.   On: 08/26/2021 14:53   CT HEAD CODE STROKE WO CONTRAST  Result Date: 08/26/2021 CLINICAL DATA:  Code stroke. EXAM: CT HEAD WITHOUT CONTRAST TECHNIQUE: Contiguous axial images were obtained from the base of the skull through the vertex without intravenous contrast. RADIATION DOSE REDUCTION: This exam was performed according to the departmental dose-optimization program which includes automated exposure control, adjustment of the mA and/or kV according to patient size and/or use of iterative reconstruction technique. COMPARISON:  05/09/2019 FINDINGS: Brain: No evidence of acute infarction, hemorrhage, cerebral edema, mass, mass effect, or midline shift. Ventricles and sulci are normal for age. No extra-axial fluid collection. Periventricular white matter changes, likely the sequela of chronic small vessel ischemic disease. Vascular: No hyperdense vessel. Skull: Negative for fracture or focal lesion. Sinuses/Orbits:  Partial opacification of the right-greater-than-left maxillary sinus. Status post bilateral lens replacements. Other: The mastoid air cells are well aerated. ASPECTS ([facility] Program Early CT Score) - Ganglionic level infarction (caudate, lentiform nuclei, internal capsule, insula, M1-M3 cortex): 7 - Supraganglionic infarction (M4-M6 cortex): 3 Total score (0-10 with 10 being normal): 10 IMPRESSION: 1. No acute intracranial process. 2. ASPECTS is 10 Code stroke imaging results were communicated on 08/26/2021 at 2:23 pm to provider Dr. SethLeonie Man secure text paging. Electronically Signed   By: AlisMerilyn Baba.   On: 08/26/2021 14:24    Procedures .Critical Care  Performed by: RancEzequiel Essex Authorized by: RancEzequiel Essex   Critical care provider statement:    Critical care time (minutes):  35   Critical care time was exclusive of:  Separately billable procedures and treating other patients   Critical care was necessary to treat or prevent imminent or life-threatening deterioration of the following conditions:  CNS failure or compromise   Critical care was time spent personally by me on the following activities:  Development of treatment plan with patient or surrogate, discussions with consultants, evaluation of patient's response to treatment, examination of patient, ordering and review of laboratory studies, ordering and review of radiographic studies, ordering and performing treatments and interventions, pulse oximetry, re-evaluation of patient's condition and review of old charts   I assumed direction of critical care for this patient from another provider in my specialty: no     Care discussed with: admitting provider       Medications Ordered in ED Medications - No data to display  ED Course/ Medical Decision Making/ A&P                           Medical Decision Making Amount and/or Complexity of Data Reviewed Independent Historian: EMS Labs: ordered. Decision-making  details documented in ED Course. Radiology: ordered and independent interpretation performed. Decision-making details documented in ED Course. ECG/medicine tests: ordered and independent interpretation performed. Decision-making details documented in ED Course.  Risk Prescription drug management. Decision regarding hospitalization.  Patient with word finding difficulty since this morning as well as left-sided neglect on exam.  Code stroke activated.  Last seen normal was at 8 PM June 7.   CT head shows no hemorrhage. No LVO on CT, no penumbra on perfusion imaging. Results reviewed and interpreted by me. Questionable aneurysm versus infundibulum of L ICA of uncertain significance. PAtient denies thunderclap onset headache.   Dr. Leonie Man and stroke team have seen patient. Not thrombolytic candidate due to delay in presentation. No LVO.  Admission d/w Dr. Rogers Blocker.         Final Clinical Impression(s) / ED Diagnoses Final diagnoses:  Cerebral infarction, unspecified mechanism [facility])    Rx / [place] Orders ED Discharge Orders     None         Kayleann Mccaffery, Annie Main, MD 08/26/21 1747

## 2021-08-26 NOTE — ED Notes (Signed)
[facility] called to activate code stroke per Dr. Robin Searing requst. Spoke with Joelene Millin

## 2021-08-26 NOTE — Assessment & Plan Note (Addendum)
TSH wnl in 01/2021. Repeat pending Continue home home synthroid

## 2021-08-26 NOTE — Assessment & Plan Note (Signed)
Continue TUMS prn

## 2021-08-26 NOTE — Assessment & Plan Note (Signed)
At baseline, continue to monitor °

## 2021-08-26 NOTE — Progress Notes (Signed)
EEG complete - results pending 

## 2021-08-26 NOTE — Assessment & Plan Note (Addendum)
88 year old male with history of headache followed by confusion and aphasic episodes with history of TIA, HLD and family history of CVA admitted for stroke work up vs. Complex migraine  -admit telemetry for TIA/stroke work-up -Neurochecks per protocol -Neurology consulted, also checking EEG to r/o seizure  -MRI brain without contrast hopefully he can tolerate as has not been able to in the past. Neurology ordered haldol and ativan prior.  -echo, although had echo in 2021 with  No structural abnormality  -fasting lipid panel and A1C  -states he is taking ASA to me, but told others he is not taking this daily. ASA '325mg'$  per neurology  -N.p.o. until bedside swallow screen -PT/ OT/ SLP consult

## 2021-08-26 NOTE — Plan of Care (Signed)
  Problem: Education: Goal: Knowledge of General Education information will improve Description Including pain rating scale, medication(s)/side effects and non-pharmacologic comfort measures Outcome: Progressing   

## 2021-08-26 NOTE — Consult Note (Addendum)
Neurology Consultation  Reason for Consult: Code stroke  Referring Physician: Dr. Wyvonnia Dusky   CC: aphasia   History is obtained from:patient and medical record and wife via phone   HPI: Larry [name] is a 88 y.o. male with past medical history HLD, GERD. Hypothyroidism who presents to [facility] ED via EMS for evaluation of persistent headache since yesterday and aphasia. Code stroke called by MD in ED. LKW 0800 NIHSS 1. CT head no acute abnormality. CTA head/ neck with Perf with no LVO and no perfusion deficit. Spoke with wife via phone and she stated yesterday on 6/7 he c/o headache and "just not feeling right". Last night he slept in the recliner and this morning when he awoke he still had c/o headache and not feeling well. '@0800'$  she noticed that he was not using his words correctly and the symptoms persisted so she called EMS. She did not notice any facial droop, slurred speech or weakness. She states he still works in the yard and goes for daily walks. He does not take anticoagulants or antiplatelets.  Daughter and son in law are present in the room and updated by Dr. Leonie Man. Daughter endorses 3 similar episodes in the past, however symptoms did not last as long as this episode.  Patient did have CT scan for those episodes which was unremarkable but he was unable to tolerate MRI.  He was not placed on any antiplatelet agents and these occurred after he had received second COVID vaccination shots was felt to be TIAs.  Is never seen a neurologist.   LKW: 0800 tpa given?: no, low NIHSS and presented outside window Mechanical thrombectomy no as no LVO Premorbid modified Rankin scale (mRS):  0-Completely asymptomatic and back to baseline post-stroke  ROS: Full ROS was performed and is negative except as noted in the HPI.    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   Esophageal reflux    Family history of diabetes mellitus    History of chickenpox     Hypertriglyceridemia    Other and unspecified hyperlipidemia    Pre-syncope    Thrombocytopenia, unspecified ([place])    Unspecified hypothyroidism      Family History  Problem Relation Age of Onset   Stroke Mother    Diabetes Mother        borderline   Pneumonia Father    Hypertension Sister    Hyperlipidemia Sister    Alzheimer's disease Brother    Dementia Brother    Liver disease Sister        cirrhosis, nondrinker   Arthritis Other    Diabetes Other        1st degree relative   Hyperlipidemia Other    Hypertension Other    Prostate cancer Neg Hx    Colon cancer Neg Hx      Social History:   reports that he has never smoked. He has never used smokeless tobacco. He reports that he does not drink alcohol and does not use drugs.  Medications No current facility-administered medications for this encounter.  Current Outpatient Medications:    ALPRAZolam (XANAX) 0.25 MG tablet, Use 1/2 tab twice daily as needed., Disp: 30 tablet, Rfl: 1   aspirin EC 81 MG tablet, Take 1 tablet (81 mg total) by mouth daily. Swallow whole., Disp: , Rfl:    benzocaine (ANBESOL) 10 % mucosal gel, Use as directed 1 application in the mouth or throat daily as needed  for mouth pain., Disp: 5.3 g, Rfl: 0   calcium carbonate (TUMS - DOSED IN MG ELEMENTAL CALCIUM) 500 MG chewable tablet, Chew 1 tablet by mouth daily as needed for indigestion or heartburn., Disp: , Rfl:    Coenzyme Q10 (CO Q 10 PO), Take 1 capsule by mouth daily., Disp: , Rfl:    diclofenac sodium (VOLTAREN) 1 % GEL, Apply 2 g topically 4 (four) times daily as needed., Disp: 100 g, Rfl: 2   EPINEPHrine 0.3 mg/0.3 mL IJ SOAJ injection, INJECT 0.3 MLS (1 PEN) INTO THE MUSCLE ONCE. AS NEEDED, Disp: 2 Device, Rfl: 0   folic acid (FOLVITE) 1 MG tablet, Take 1 mg by mouth daily., Disp: , Rfl:    KRILL OIL PO, Take 1 tablet by mouth daily., Disp: , Rfl:    methotrexate (RHEUMATREX) 2.5 MG tablet, Take 2 tablets (5 mg total) by mouth once a  week. Caution:Chemotherapy. Protect from light., Disp: , Rfl:    Omega-3 Fatty Acids (FISH OIL PO), Take 1 tablet by mouth daily., Disp: , Rfl:    Red Yeast Rice Extract (RED YEAST RICE PO), Take 1 tablet by mouth daily., Disp: , Rfl:    SYNTHROID 50 MCG tablet, TAKE 1 TABLET BY MOUTH  DAILY BEFORE BREAKFAST, Disp: 90 tablet, Rfl: 3   Exam: Current vital signs: BP (!) 155/91 (BP Location: Right Arm)   Pulse 80   Temp 98 F (36.7 C) (Oral)   Resp 20   Ht '5\' 5"'$  (1.651 m)   Wt 68 kg   SpO2 97%   BMI 24.96 kg/m  Vital signs in last 24 hours: Temp:  [98 F (36.7 C)] 98 F (36.7 C) (06/08 1401) Pulse Rate:  [80] 80 (06/08 1401) Resp:  [20] 20 (06/08 1401) BP: (155)/(91) 155/91 (06/08 1401) SpO2:  [97 %] 97 % (06/08 1401) Weight:  [68 kg] 68 kg (06/08 1401)  GENERAL: Awake, alert in NAD HEENT: - Normocephalic and atraumatic, dry mm LUNGS - Clear to auscultation bilaterally with no wheezes CV - S1S2 RRR, no m/r/g, equal pulses bilaterally. ABDOMEN - Soft, nontender, nondistended with normoactive BS Ext: warm, well perfused, intact peripheral pulses, no edema  NEURO:  Mental Status: AA&Ox3  Language: speech is clear. Has some expressive aphasia with intermittent word finding difficulties and a few paraphasic errors.  Able to name repeat and comprehend well. Cranial Nerves: PERRL 2 mm/brisk. EOMI, visual fields full, no facial asymmetry, facial sensation intact, hearing intact, tongue/uvula/soft palate midline, normal sternocleidomastoid and trapezius muscle strength. No evidence of tongue atrophy or fibrillations Motor: 5/5 in all 4 extremities  Tone: is normal and bulk is normal Sensation- Intact to light touch bilaterally Coordination: FTN intact bilaterally, no ataxia in BLE. Gait- deferred  NIHSS 1a Level of Conscious.: 0 1b LOC Questions: 0 1c LOC Commands: 0 2 Best Gaze: 0 3 Visual: 0 4 Facial Palsy: 0 5a Motor Arm - left: 0 5b Motor Arm - Right: 0 6a Motor Leg -  Left: 0 6b Motor Leg - Right: 0 7 Limb Ataxia: 0 8 Sensory: 0 9 Best Language: 1 10 Dysarthria: 0 11 Extinct. and Inatten.: 0 TOTAL: 1   Imaging I have reviewed the images obtained:  Code stroke CT-head No acute intracranial process. ASPECTS is 10  Code stroke CTA head/neck with Perf  1. No emergent large vessel occlusion. 2. Moderate left P2 PCA stenosis. 3. No evidence of core infarct or penumbra on perfusion. 4. Small (1-2 mm) inferiorly directed outpouching arising  from the supraclinoid left ICA, compatible with small aneurysm versus infundibulum with vessel not well seen  Assessment:  Larry [name] is a 88 y.o. male with past medical history HLD, GERD. Hypothyroidism who presents to [facility] ED via EMS for evaluation of persistent headache since yesterday and aphasia. Code stroke called by MD in ED. LKW 0800 NIHSS 1. CT head no acute abnormality. He does not take anticoagulants or antiplatelets.  Daughter endorses 3 similar episodes in the past, however symptoms did not last as long as this episode.   Complex migraine vs TIA vs seizure   Recommendations: - admit to medicine  - HgbA1c, fasting lipid panel - Routine EEG to r/o seizures  - MRI of the brain without contrast. Will order '1mg'$  haldol and '1mg'$  ativan x 1 for oncall to MRI due to severe claustrophobia.  - Frequent neuro checks - Bedside swallow eval  - Echocardiogram - Prophylactic therapy-Antiplatelet med: Aspirin - dose '325mg'$  daily  - Risk factor modification - Telemetry monitoring - PT consult, OT consult, Speech consult - Stroke team to follow   Beulah Gandy DNP, ACNPC-AG   STROKE MD NOTE :  I have personally obtained history,examined this patient, reviewed notes, independently viewed imaging studies, participated in medical decision making and plan of care.ROS completed by me personally and pertinent positives fully documented  I have made any additions or clarifications directly to the above note. Agree with  note above.  Patient presented with mild expressive language difficulties and paraphasic errors in the setting of a headache after waking up from sleep.  He has had somewhat similar though milder episodes in the past raising concern for complicated migraine versus seizures or TIAs.  CT angio and perfusion did not suggest large stroke and is presented outside time for thrombolysis.  Recommend admission for further work-up.  Check MRI scan with patient mainly sedation and agrees extremity positive for right.  Check EEG cardiac monitoring.  Long discussion with patient and daughter and son-in-law at the bedside and answered questions.  Discussed with DrMarland Kitchen  Stroke team will follow.  Greater than 50% time during this 80-minute consultation visit were spent in counseling and coordination of care about TIA versus complex migraine and seizure discussion about evaluation and treatment notes questions.  Antony Contras, MD Medical Director [facility] Pager: [phone] 08/26/2021 3:41 PM

## 2021-08-26 NOTE — Procedures (Signed)
Patient Name: [name]  MRN: [id number]  Epilepsy Attending: Lora Havens  Referring Physician/Provider: August Albino, NP  Date: 08/26/2021 Duration: 24.55 mins  Patient history:  88 y.o. male with past medical history HLD, GERD. Hypothyroidism who presents to [facility] ED via EMS for evaluation of persistent headache since yesterday and aphasia. EEG to evaluate for seizure.  Level of alertness: Awake  AEDs during EEG study: None  Technical aspects: This EEG study was done with scalp electrodes positioned according to the 10-20 International system of electrode placement. Electrical activity was acquired at a sampling rate of '500Hz'$  and reviewed with a high frequency filter of '70Hz'$  and a low frequency filter of '1Hz'$ . EEG data were recorded continuously and digitally stored.   Description: The posterior dominant rhythm consists of 8-9 Hz activity of moderate voltage (25-35 uV) seen predominantly in posterior head regions, symmetric and reactive to eye opening and eye closing. EEG showed continuous 3 to 6 Hz theta-delta slowing in left fronto-temporal region.  Hyperventilation and photic stimulation were not performed.     ABNORMALITY - Continuous slow, left fronto-temporal region  IMPRESSION: This study is suggestive of cortical dysfunction arising from left fronto-temporal likely secondary to underlying structural abnormality, post-ictal state. No seizures or epileptiform discharges were seen throughout the recording.  Larry [name] Larry [name]

## 2021-08-26 NOTE — ED Triage Notes (Signed)
Pt arrived via [facility] from home with c/c of Stroke Like Symptoms. Per EMS pt had a headache since 6/7 at 1200. Pt went to sleep at 2000 without any issues, but woke at 0800 6/8 where he had aphasia. Denies weakness or any other symptoms. Pt has hx of TIA.   160/90, 65HR, 97% RA, CBG 101

## 2021-08-26 NOTE — ED Notes (Signed)
Janace Hoard (daughter) [phone]

## 2021-08-26 NOTE — H&P (Signed)
History and Physical    Patient: Larry [name] ELF:[id number] DOB: 10-10-1929 DOA: 08/26/2021 DOS: the patient was seen and examined on 08/26/2021 PCP: Tonia Ghent, MD  Patient coming from: Home - lives with his wife. Ambulates independently.    Chief Complaint: headache/aphasia/left side neglect   HPI: Larry [name] is a 88 y.o. male with medical history significant of GERD, HLD, hypothyroidism, hx of TIA who presented to ED with complaints of headache and aphasia/confusion.  He had a headache all day yesterday and slept on the couch last night. This morning he felt bad, still had a headache and started to have confusion around 800AM.  He wasn't able to talk and form words.  His wife states he was talking "gibberish." His wife asked him if he was confused and he said yes. She said this would happen in periods where he was able to talk then couldn't. They didn't want to call their daughter to give her more stress. His wife didn't notice any facial drooping or weakness. After they talked to their daughter they did call 911. After EMS got there he was using words, but they were not the correct words. His speech has improved, but he states she still has difficulty finding the words he wants to say at times. He denies any recent illness, weakness, slurred speech or focal deficits.   LKW per daughter day before yesterday. Also states he has had 3 previous episodes of transient confusion that followed covid shots/boosters, but resolved.  One time he went to ER but was tired of waiting and left. Per records in 12/21 he had headache followed by confusion. Left before being seen. Admitted to [facility] in 04/2019 for TIA with symptoms of transient aphasia. Unable to tolerate MRI during this stay. Declined statin at this time.    His headache has resolved. Family history of CVA in his mother.   He has been feeling good. Denies any fever/chills, vision changes, chest pain or palpitations, shortness of breath or  cough, abdominal pain, N/V/D, dysuria or leg swelling.   He does not smoke or drink alcohol.   ER Course:  vitals: afebrile, bp: 155/91, hR; 80, RR: 20, oxygen 97%RA Pertinent labs: sodium 133, platelets 119,  CT head: no acute process CTA  head/neck: no LVO. Moderate left p2 PCA stenosis. 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 In ED: code stroke activated. Neurology consulted. Given ASA.    Review of Systems: As mentioned in the history of present illness. All other systems reviewed and are negative. 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 He has never used smokeless tobacco. He reports that he does not drink alcohol and does not use drugs.  Allergies  Allergen Reactions   Alpha-Gal Other (See Comments)   Sulfur Itching   Antihistamines, Diphenhydramine-Type     Urinary retention   Atorvastatin     REACTION: Intolerance   Beef-Derived Products     Tolerates chicken/fish/turkey  Ezetimibe     REACTION: Intolerance   Other     ALPHAGAL- see above re: beef allergy   Pork-Derived Products Other (See Comments)    Due to alpha gal hx   Simvastatin     REACTION: Intolerance   Sulfonamide Derivatives     REACTION: hives, itching   Tramadol Other (See Comments)    Lightheaded    Family History  Problem Relation Age of Onset   Stroke Mother    Diabetes Mother        borderline   Pneumonia Father    Hypertension Sister    Hyperlipidemia Sister     Alzheimer's disease Brother    Dementia Brother    Liver disease Sister        cirrhosis, nondrinker   Arthritis Other    Diabetes Other        1st degree relative   Hyperlipidemia Other    Hypertension Other    Prostate cancer Neg Hx    Colon cancer Neg Hx     Prior to Admission medications   Medication Sig Start Date End Date Taking? Authorizing Provider  calcium carbonate (TUMS - DOSED IN MG ELEMENTAL CALCIUM) 500 MG chewable tablet Chew 1 tablet by mouth daily as needed for indigestion or heartburn.   Yes [provider]  Coenzyme Q10 (CO Q 10 PO) Take 1 capsule by mouth daily.   Yes [provider]  folic acid (FOLVITE) 1 MG tablet Take 1 mg by mouth daily.   Yes [provider]  KRILL OIL PO Take 1 tablet by mouth daily.   Yes [provider]  methotrexate (RHEUMATREX) 2.5 MG tablet Take 2 tablets (5 mg total) by mouth once a week. Caution:Chemotherapy. Protect from light. 12/18/17  Yes Tonia Ghent, MD  Omega-3 Fatty Acids (FISH OIL PO) Take 1 tablet by mouth daily.   Yes [provider]  Red Yeast Rice Extract (RED YEAST RICE PO) Take 1 tablet by mouth daily.   Yes [provider]  SYNTHROID 50 MCG tablet TAKE 1 TABLET BY MOUTH  DAILY BEFORE BREAKFAST 12/23/20  Yes Tonia Ghent, MD  ALPRAZolam Duanne Moron) 0.25 MG tablet Use 1/2 tab twice daily as needed. Patient not taking: Reported on 08/26/2021 02/01/21   Tonia Ghent, MD  aspirin EC 81 MG tablet Take 1 tablet (81 mg total) by mouth daily. Swallow whole. Patient not taking: Reported on 08/26/2021 03/09/20   Tonia Ghent, MD  benzocaine (ANBESOL) 10 % mucosal gel Use as directed 1 application in the mouth or throat daily as needed for mouth pain. Patient not taking: Reported on 08/26/2021 09/11/20   Tonia Ghent, MD  diclofenac sodium (VOLTAREN) 1 % GEL Apply 2 g topically 4 (four) times daily as needed. Patient not taking: Reported on 08/26/2021 02/15/17   Tonia Ghent, MD  EPINEPHrine 0.3 mg/0.3 mL IJ SOAJ injection INJECT 0.3 MLS (1 PEN) INTO THE MUSCLE ONCE. AS NEEDED Patient not taking: Reported on 08/26/2021 02/14/18   Tonia Ghent, MD    Physical Exam: Vitals:   08/26/21 1401 08/26/21 1415  BP: (!) 155/91 (!) 167/74  Pulse: 80 71  Resp: 20 15  Temp: 98 F (36.7 C)   TempSrc: Oral   SpO2: 97% 97%  Weight: 68 kg   Height: '5\' 5"'$  (1.651 m)    General:  Appears calm and comfortable and is in NAD Eyes:  PERRL, EOMI, normal lids, iris ENT:  HOH,  lips & tongue, mmm; appropriate dentition Neck:  no LAD, masses or thyromegaly; no carotid bruits Cardiovascular:  RRR, no m/r/g. No LE edema.  Respiratory:   CTA bilaterally with no wheezes/rales/rhonchi.  Normal respiratory effort. Abdomen:  soft, NT, ND, NABS Back:   normal alignment, no CVAT Skin:  no rash or induration seen on limited exam Musculoskeletal:  grossly normal tone BUE/BLE, good ROM, no bony abnormality Lower extremity:  No LE edema.  Limited foot exam with no ulcerations.  2+ distal pulses. Psychiatric:  grossly normal mood and affect, speech fluent and appropriate. At times slow to think for word, AOx3 Neurologic:  CN 2-12 grossly intact, moves all extremities in coordinated fashion, sensation intact. HTK intact bilaterally. FTN intact bilaterally. DTR 2+ gait deferred.    Radiological Exams on Admission: Independently reviewed - see discussion in A/P where applicable  CT ANGIO HEAD NECK W WO CM W PERF (CODE STROKE)  Result Date: 08/26/2021 CLINICAL DATA:  Neuro deficit, acute, stroke suspected EXAM: CT ANGIOGRAPHY HEAD AND NECK CT PERFUSION BRAIN TECHNIQUE: Multidetector CT imaging of the head and neck was performed using the standard protocol during bolus administration of intravenous contrast. Multiplanar CT image reconstructions and MIPs were obtained to evaluate the vascular anatomy. Carotid stenosis measurements (when applicable) are obtained utilizing NASCET criteria,  using the distal internal carotid diameter as the denominator. Multiphase CT imaging of the brain was performed following IV bolus contrast injection. Subsequent parametric perfusion maps were calculated using RAPID software. RADIATION DOSE REDUCTION: This exam was performed according to the departmental dose-optimization program which includes automated exposure control, adjustment of the mA and/or kV according to patient size and/or use of iterative reconstruction technique. CONTRAST:  17m OMNIPAQUE IOHEXOL 350 MG/ML SOLN COMPARISON:  None Available. FINDINGS: CTA NECK FINDINGS Aortic arch: Great vessel origins are patent without significant stenosis. Right carotid system: No evidence of dissection, stenosis (50% or greater) or occlusion. Left carotid system: No evidence of dissection, stenosis (50% or greater) or occlusion. Vertebral arteries: Codominant. No evidence of dissection, stenosis (50% or greater) or occlusion. Skeleton: Moderate multilevel degenerative change. Other neck: No acute abnormality. Upper chest: Biapical pleuroparenchymal scarring. Visualized lung apices are otherwise clear. Review of the MIP images confirms the above findings CTA HEAD FINDINGS Anterior circulation: Bilateral intracranial ICAs, MCAs, and ACAs are patent without proximal hemodynamically significant stenosis. Hypoplastic right A1 ACA comparable congenital. Small (1-2 mm) inferiorly directed outpouching arising from the supraclinoid left ICA (series 8, image 102; series 10, image 23), compatible with small aneurysm versus infundibulum with vessel not well seen. Posterior circulation: Bilateral intradural vertebral arteries, basilar artery, and bilateral posterior cerebral arteries are patent. Moderate stenosis of the proximal left P2 PCA. CT Brain Perfusion Findings: ASPECTS: 10 CBF (<30%) Volume: 0761mPerfusion (Tmax>6.0s) volume: 61m21mismatch Volume: 61mL561mfarction Location:None identified. IMPRESSION: 1. No emergent large  vessel occlusion. 2. Moderate left P2 PCA stenosis. 3. No evidence of core infarct or penumbra on perfusion. 4. 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 Electronically Signed   By: FredMargaretha Sheffield.   On: 08/26/2021 14:53   CT HEAD CODE STROKE WO CONTRAST  Result Date: 08/26/2021 CLINICAL DATA:  Code stroke. EXAM: CT HEAD WITHOUT CONTRAST TECHNIQUE: Contiguous axial images were obtained from the base of the skull through the vertex without intravenous contrast. RADIATION DOSE REDUCTION: This exam was performed according to the departmental dose-optimization program which includes  automated exposure control, adjustment of the mA and/or kV according to patient size and/or use of iterative reconstruction technique. COMPARISON:  05/09/2019 FINDINGS: Brain: No evidence of acute infarction, hemorrhage, cerebral edema, mass, mass effect, or midline shift. Ventricles and sulci are normal for age. No extra-axial fluid collection. Periventricular white matter changes, likely the sequela of chronic small vessel ischemic disease. Vascular: No hyperdense vessel. Skull: Negative for fracture or focal lesion. Sinuses/Orbits: Partial opacification of the right-greater-than-left maxillary sinus. Status post bilateral lens replacements. Other: The mastoid air cells are well aerated. ASPECTS [facility] Stroke Program Early CT Score) - Ganglionic level infarction (caudate, lentiform nuclei, internal capsule, insula, M1-M3 cortex): 7 - Supraganglionic infarction (M4-M6 cortex): 3 Total score (0-10 with 10 being normal): 10 IMPRESSION: 1. No acute intracranial process. 2. ASPECTS is 10 Code stroke imaging results were communicated on 08/26/2021 at 2:23 pm to provider Dr. Leonie Man via secure text paging. Electronically Signed   By: Merilyn Baba M.D.   On: 08/26/2021 14:24    EKG: Independently reviewed.  NSR with  rate 68; nonspecific ST changes with no evidence of acute ischemia   Labs on Admission: I have personally reviewed the available labs and imaging studies at the time of the admission.  Pertinent labs:   Platelets 119   Assessment and Plan: Principal Problem:   Stroke-like symptoms of aphasia/confusion following headache  Active Problems:   HLD (hyperlipidemia)   Thrombocytopenia ([facility])   Hypothyroidism   Allergy to alpha-gal   GERD    Assessment and Plan: * Stroke-like symptoms of aphasia/confusion following headache  88 year old male with history of headache followed by confusion and aphasic episodes with history of TIA, HLD and family history of CVA admitted for stroke work up vs. Complex migraine  -admit telemetry for TIA/stroke work-up -Neurochecks per protocol -Neurology consulted, also checking EEG to r/o seizure  -MRI brain without contrast hopefully he can tolerate as has not been able to in the past. Neurology ordered haldol and ativan prior.  -echo, although had echo in 2021 with  No structural abnormality  -fasting lipid panel and A1C  -states he is taking ASA to me, but told others he is not taking this daily. ASA '325mg'$  per neurology  -N.p.o. until bedside swallow screen -PT/ OT/ SLP consult   HLD (hyperlipidemia) Appears to be statin intolerant. Does not look like he has tried crestor  LDL was 184 in 01/2021, fasting panel pending  Goal LDL <70. Consider zetia or trial crestor or PCSK9 inhibitor   Thrombocytopenia ([place]) At baseline, continue to monitor   Hypothyroidism TSH wnl in 01/2021. Repeat pending Continue home home synthroid   Allergy to alpha-gal Continue MTX. Takes weekly on Wednesday   GERD Continue TUMS prn    Advance Care Planning:   Code Status: Full Code wife states she thinks he is DNR, but will check records.   Consults: neurology: Dr. Leonie Man   DVT Prophylaxis: SCD  Family Communication: daughter at bedside: [name]. Called  and updated spouse, martha Maslow by phone: [phone]  Severity of Illness: The appropriate patient status for this patient is INPATIENT. Inpatient status is judged to be reasonable and necessary in order to provide the required intensity of service to ensure the patient's safety. The patient's presenting symptoms, physical exam findings, and initial radiographic and laboratory data in the context of their chronic comorbidities is felt to place them at high risk for further clinical deterioration. Furthermore, it is not anticipated that the patient will be  medically stable for discharge from the hospital within 2 midnights of admission.   * I certify that at the point of admission it is my clinical judgment that the patient will require inpatient hospital care spanning beyond 2 midnights from the point of admission due to high intensity of service, high risk for further deterioration and high frequency of surveillance required.*  Author: Orma Flaming, MD 08/26/2021 4:25 PM  For on call review [email or web address].

## 2021-08-27 ENCOUNTER — Encounter (HOSPITAL_COMMUNITY): Payer: Self-pay | Admitting: Family Medicine

## 2021-08-27 ENCOUNTER — Inpatient Hospital Stay (HOSPITAL_BASED_OUTPATIENT_CLINIC_OR_DEPARTMENT_OTHER): Payer: Medicare Other

## 2021-08-27 ENCOUNTER — Inpatient Hospital Stay (HOSPITAL_COMMUNITY): Payer: Medicare Other

## 2021-08-27 DIAGNOSIS — G459 Transient cerebral ischemic attack, unspecified: Secondary | ICD-10-CM | POA: Diagnosis not present

## 2021-08-27 DIAGNOSIS — R2990 Unspecified symptoms and signs involving the nervous system: Secondary | ICD-10-CM | POA: Diagnosis not present

## 2021-08-27 LAB — ECHOCARDIOGRAM COMPLETE
AR max vel: 4.25 cm2
AV Area VTI: 3.58 cm2
AV Area mean vel: 3.83 cm2
AV Mean grad: 2 mmHg
AV Peak grad: 3.3 mmHg
Ao pk vel: 0.91 m/s
Area-P 1/2: 2.69 cm2
Height: 65 in
S' Lateral: 3.2 cm
Weight: 2400 oz

## 2021-08-27 LAB — RESP PANEL BY RT-PCR (FLU A&B, COVID) ARPGX2
Influenza A by PCR: NEGATIVE
Influenza B by PCR: NEGATIVE
SARS Coronavirus 2 by RT PCR: NEGATIVE

## 2021-08-27 LAB — CBC
HCT: 41.1 % (ref 39.0–52.0)
Hemoglobin: 14.2 g/dL (ref 13.0–17.0)
MCH: 32.5 pg (ref 26.0–34.0)
MCHC: 34.5 g/dL (ref 30.0–36.0)
MCV: 94.1 fL (ref 80.0–100.0)
Platelets: 122 10*3/uL — ABNORMAL LOW (ref 150–400)
RBC: 4.37 MIL/uL (ref 4.22–5.81)
RDW: 13.4 % (ref 11.5–15.5)
WBC: 4.9 10*3/uL (ref 4.0–10.5)
nRBC: 0 % (ref 0.0–0.2)

## 2021-08-27 LAB — BASIC METABOLIC PANEL
Anion gap: 6 (ref 5–15)
BUN: 12 mg/dL (ref 8–23)
CO2: 24 mmol/L (ref 22–32)
Calcium: 8.5 mg/dL — ABNORMAL LOW (ref 8.9–10.3)
Chloride: 104 mmol/L (ref 98–111)
Creatinine, Ser: 0.67 mg/dL (ref 0.61–1.24)
GFR, Estimated: >60 mL/min (ref >60)
Glucose, Bld: 107 mg/dL — ABNORMAL HIGH (ref 70–99)
Potassium: 3.7 mmol/L (ref 3.5–5.1)
Sodium: 134 mmol/L — ABNORMAL LOW (ref 135–145)

## 2021-08-27 LAB — LIPID PANEL
Cholesterol: 186 mg/dL (ref 0–200)
HDL: 39 mg/dL — ABNORMAL LOW (ref >40)
LDL Cholesterol: 112 mg/dL — ABNORMAL HIGH (ref 0–99)
Total CHOL/HDL Ratio: 4.8 RATIO
Triglycerides: 177 mg/dL — ABNORMAL HIGH (ref <150)
VLDL: 35 mg/dL (ref 0–40)

## 2021-08-27 LAB — HEMOGLOBIN A1C
Hgb A1c MFr Bld: 5.4 % (ref 4.8–5.6)
Mean Plasma Glucose: 108.28 mg/dL

## 2021-08-27 MED ORDER — STROKE: EARLY STAGES OF RECOVERY BOOK
Status: AC
  Filled 2021-08-27: qty 1

## 2021-08-27 MED ORDER — LORAZEPAM 2 MG/ML IJ SOLN
1.0000 mg | Freq: Once | INTRAMUSCULAR | Status: AC | PRN
  Administered 2021-08-27: 1 mg via INTRAVENOUS
  Filled 2021-08-27: qty 1

## 2021-08-27 NOTE — Evaluation (Signed)
Occupational Therapy Evaluation Patient Details Name: Horace Lukas MRN: [id number] DOB: Mar 28, 1929 Today's Date: 08/27/2021   History of Present Illness Pt is a 88 y/o male presenting on 6/8 with persistent headache and aphasia. CT negatvie, MRI negative. PMH includes: HLD, GERD.   Clinical Impression   PTA patient independent and driving. Admitted for above and presents near baseline level, completing Adls and mobility with up to supervision assist. Mild instability and several LOB posteriorly initially, but improved with increased mobility in room. Mild STM deficits noted, but otherwise cognition appears WFL. Educated on increased initial support from family at dc would be recommended for safety.  Daughter present and supportive. Reviewed safety and BEFAST sign/symptoms of CVA. Will follow acutely. Anticipate no further needs after dc home.      Recommendations for follow up therapy are one component of a multi-disciplinary discharge planning process, led by the attending physician.  Recommendations may be updated based on patient status, additional functional criteria and insurance authorization.   Follow Up Recommendations  No OT follow up    Assistance Recommended at Discharge Intermittent Supervision/Assistance  Patient can return home with the following A little help with walking and/or transfers;A little help with bathing/dressing/bathroom;Assistance with cooking/housework;Assist for transportation;Help with stairs or ramp for entrance    Functional Status Assessment  Patient has had a recent decline in their functional status and demonstrates the ability to make significant improvements in function in a reasonable and predictable amount of time.  Equipment Recommendations  None recommended by OT    Recommendations for Other Services       Precautions / Restrictions Restrictions Weight Bearing Restrictions: No      Mobility Bed Mobility Overal bed mobility: Modified  Independent                  Transfers                          Balance Overall balance assessment: Mild deficits observed, not formally tested                                         ADL either performed or assessed with clinical judgement   ADL Overall ADL's : Needs assistance/impaired     Grooming: Supervision/safety;Standing           Upper Body Dressing : Supervision/safety;Sitting   Lower Body Dressing: Supervision/safety;Sit to/from stand   Toilet Transfer: Supervision/safety;Ambulation   Toileting- Clothing Manipulation and Hygiene: Modified independent;Sit to/from stand       Functional mobility during ADLs: Supervision/safety       Vision   Vision Assessment?: No apparent visual deficits     Perception     Praxis      Pertinent Vitals/Pain Pain Assessment Pain Assessment: No/denies pain     Hand Dominance Right   Extremity/Trunk Assessment Upper Extremity Assessment Upper Extremity Assessment: Overall WFL for tasks assessed   Lower Extremity Assessment Lower Extremity Assessment: Defer to PT evaluation       Communication Communication Communication: No difficulties   Cognition Arousal/Alertness: Awake/alert Behavior During Therapy: WFL for tasks assessed/performed Overall Cognitive Status: Within Functional Limits for tasks assessed                                 General Comments: appears WFL,  some decreased STM but anticipate age related     General Comments  inital posterior LOB several times but improved during session.  Encouarged increased support of family initally at dc.    Exercises     Shoulder Instructions      Home Living Family/patient expects to be discharged to:: Private residence Living Arrangements: Spouse/significant other Available Help at Discharge: Family;Available 24 hours/day Type of Home: House Home Access: Stairs to enter [employer] of  Steps: 1+2 Entrance Stairs-Rails: Can reach both Home Layout: One level     Bathroom Shower/Tub: Occupational psychologist: Standard     Home Equipment: Grab bars - tub/shower          Prior Functioning/Environment Prior Level of Function : Independent/Modified Independent;Driving               ADLs Comments: yardwork (mows yard),        OT Problem List: Decreased activity tolerance;Impaired balance (sitting and/or standing);Decreased safety awareness;Decreased knowledge of use of DME or AE      OT Treatment/Interventions: Therapeutic exercise;Self-care/ADL training;DME and/or AE instruction;Therapeutic activities;Patient/family education;Balance training    OT Goals(Current goals can be found in the care plan section) Acute Rehab OT Goals Patient Stated Goal: home OT Goal Formulation: With patient Time For Goal Achievement: 09/10/21 Potential to Achieve Goals: Good  OT Frequency: Min 2X/week    Co-evaluation              AM-PAC OT "6 Clicks" Daily Activity     Outcome Measure Help from another person eating meals?: None Help from another person taking care of personal grooming?: A Little Help from another person toileting, which includes using toliet, bedpan, or urinal?: A Little Help from another person bathing (including washing, rinsing, drying)?: A Little Help from another person to put on and taking off regular upper body clothing?: A Little Help from another person to put on and taking off regular lower body clothing?: A Little 6 Click Score: 19   End of Session Nurse Communication: Mobility status  Activity Tolerance: Patient tolerated treatment well Patient left: with call bell/phone within reach;Other (comment);with bed alarm set (seated EOB)  OT Visit Diagnosis: Unsteadiness on feet (R26.81)                Time: 1194-1740 OT Time Calculation (min): 28 min Charges:  OT General Charges $OT Visit: 1 Visit OT Evaluation $OT Eval Low  Complexity: 1 Low OT Treatments $Self Care/Home Management : 8-22 mins  Jolaine Artist, OT Acute Rehabilitation Services Office [phone]   Delight Stare 08/27/2021, 11:06 AM

## 2021-08-27 NOTE — Progress Notes (Signed)
Patient returned from MRI, denies any pain.

## 2021-08-27 NOTE — Care Management CC44 (Signed)
Condition Code 44 Documentation Completed  Patient Details  Name: Larry [name] MRN: [id number] Date of Birth: 03/18/1930   Condition Code 44 given:  Yes Patient signature on Condition Code 44 notice:  Yes Documentation of 2 MD's agreement:  Yes Code 44 added to claim:  Yes    Pollie Friar, RN 08/27/2021, 11:35 AM

## 2021-08-27 NOTE — Plan of Care (Signed)

## 2021-08-27 NOTE — Evaluation (Signed)
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 11/29/2015   Near syncope 09/24/2014   Advance care planning 10/03/2013   Leg swelling 09/18/2013   Lower GI bleed 03/07/2013   Rectal bleeding 03/07/2013   Ischemic colitis ([place]) 03/07/2013   Chronic dermatitis 08/28/2012   Shoulder pain 01/25/2012   Situational anxiety 01/25/2012   Healthcare maintenance 08/15/2011   Vertigo 08/15/2011   BPH (benign prostatic hyperplasia) 08/15/2011   RHINITIS 05/11/2010   HEMORRHOIDS 10/22/2009   Hypothyroidism 09/28/2009   LOW BACK PAIN 09/28/2009   HLD (hyperlipidemia) 09/25/2009   Thrombocytopenia ([place]) 09/25/2009   GERD 09/25/2009   HEADACHE 09/25/2009   Past Medical History:  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   Esophageal reflux    Family history of diabetes mellitus    History of chickenpox    Hypertriglyceridemia    Other and unspecified hyperlipidemia    Pre-syncope    Thrombocytopenia, unspecified ([place])    Unspecified hypothyroidism    Past Surgical History:  Past Surgical History:  Procedure Laterality Date   APPENDECTOMY  1949   HPI:  Pt is a 88 y/o male presenting on 6/8 with persistent headache and aphasia. CT negative, MRI negative. PMH includes: HLD, GERD, hypothyroid, prior TIA 04/2019 with transient aphasia--also similar episodes after COVID booster.   Assessment / Plan / Recommendation Clinical Impression  Per pt and daughter, pt  at baseline level of functioning in regards to speech, language and cognition. Daughter reports some STM deficits at baseline, but no acute changes noted. He is oriented x4 and informal measures did reveal deficits with immediate memory (3/5 listed items recalled). Problem solving for completion of IADLs appears WFL, however given STM deficits, close supervision at discharge is recommended. Informed pt and daughter that if they notice any changes in memory/cognition once discharged home, to notify primary MD to see if any further intervention is warranted. No further SLP services warranted acutely.    SLP Assessment  SLP Recommendation/Assessment: Patient does not need any further [facility] SLP Visit Diagnosis: Cognitive communication deficit (R41.841)    Recommendations for follow up therapy are one component of a multi-disciplinary discharge planning process, led by the attending physician.  Recommendations may be updated based on patient status, additional functional criteria and insurance authorization.    Follow Up Recommendations  No SLP follow up    Assistance Recommended at Discharge  Intermittent Supervision/Assistance  Functional Status Assessment Patient has not had a recent decline in their functional status  Frequency and Duration           SLP Evaluation Cognition  Overall Cognitive Status: History of cognitive impairments - at baseline Arousal/Alertness: Awake/alert Orientation Level: Oriented X4 Year: 2023 Month: June Day of Week: Correct Attention: Sustained Sustained Attention: Appears intact Memory: Impaired Memory Impairment: Storage deficit;Retrieval deficit;Decreased recall of new information Awareness: Appears intact Problem Solving: Appears intact Safety/Judgment: Appears intact       Comprehension  Auditory Comprehension Overall Auditory Comprehension: Appears within  functional limits for tasks assessed Visual  Recognition/Discrimination Discrimination: Not tested Reading Comprehension Reading Status: Not tested    Expression Expression Primary Mode of Expression: Verbal Verbal Expression Overall Verbal Expression: Appears within functional limits for tasks assessed Written Expression Dominant Hand: Right Written Expression: Not tested   Oral / Motor  Oral Motor/Sensory Function Overall Oral Motor/Sensory Function: Within functional limits Motor Speech Overall Motor Speech: Appears within functional limits for tasks assessed             Ellwood Dense, [place], [place] Office Number: [phone]  Acie Fredrickson 08/27/2021, 1:12 PM

## 2021-08-27 NOTE — Progress Notes (Signed)
Subjective: Patient is doing much better today, headache is resolved and speech is almost back to normal.  Of note, he has had three previous similar episodes all occurring after some type of vaccine.  These were transient, resolving over a matter of hours.  This time, but the headache and the speech difficulty were gradual in onset.  He denies any history of migraines, however he has a very strong family history, with his father and his daughter both getting migraines, as well as multiple other family members, with no family history on his wife's side.     Exam: Vitals:   08/27/21 0350 08/27/21 0839  BP: 109/70 127/65  Pulse: 60 60  Resp: 19 18  Temp: 97.6 F (36.4 C) 98.4 F (36.9 C)  SpO2: 96% 95%   Gen: In bed, NAD Resp: non-labored breathing, no acute distress Abd: soft, nt  Neuro: MS: Awake, alert, able to name objects, able to repeat, speech is fluent without difficulty.  He is oriented to month and year CN: Visual fields full, extraocular movements intact, face symmetric Motor: No drift Sensory: Intact to light touch  Pertinent Labs: LDL 112  Impression: 88 year old male with four episodes of confusion, three of which occurred after vaccines, and the current one lasting over 24 hours.  With symptoms >24 hours with negative MRI, I feel that TIA/stroke is very unlikely.  EEG did reveal some focal dysfunction, but this is very nonspecific.  At this point, I feel possibilities include confusional migraine (very strong family history including migraine without headache), mild delirium in the setting of some underlying physiological stressor, or much less likely seizure.  He did not have any changes in consciousness, no loss of consciousness, so I do not feel strongly about driving restrictions.  Recommendations: 1) continue ASA 81 mg daily 2) no further neurodiagnostic testing at this time, if you were to have further episodes, then could revisit diagnosis at that  time.  Roland Rack, MD [facility] [phone]  If 7pm- 7am, please page neurology on call as listed in [place].

## 2021-08-27 NOTE — Evaluation (Signed)
Physical Therapy Evaluation Patient Details Name: Larry [name] MRN: [id number] DOB: [birth year] Today's Date: 08/27/2021  History of Present Illness  Pt is a 88 y/o male presenting on 6/8 with persistent headache and aphasia. CT negatvie, MRI negative. PMH includes: HLD, GERD.  Clinical Impression  Pt is close to baseline functioning and should be safe at home with wife's PRN assist.  Educated pt/dtr on need to observe how pt functions at [facility] on the "farm" and driving. There are no further acute PT needs.  Will sign off at this time.        Recommendations for follow up therapy are one component of a multi-disciplinary discharge planning process, led by the attending physician.  Recommendations may be updated based on patient status, additional functional criteria and insurance authorization.  Follow Up Recommendations No PT follow up    Assistance Recommended at Discharge PRN  Patient can return home with the following       Equipment Recommendations None recommended by PT  Recommendations for Other Services       Functional Status Assessment Patient has had a recent decline in their functional status and demonstrates the ability to make significant improvements in function in a reasonable and predictable amount of time.     Precautions / Restrictions Restrictions Weight Bearing Restrictions: No      Mobility  Bed Mobility Overal bed mobility: Modified Independent                  Transfers Overall transfer level: Modified independent                      Ambulation/Gait Ambulation/Gait assistance: Modified independent (Device/Increase time) Gait Distance (Feet): 500 Feet   Gait Pattern/deviations: Step-through pattern   Gait velocity interpretation: >4.37 ft/sec, indicative of normal walking speed   General Gait Details: generally steady until starts scanning L/R then drift and mildly staggers to self-recover balance.  No overt  LOB.  Stairs Stairs: Yes Stairs assistance: Modified independent (Device/Increase time) Stair Management: One rail Right, Alternating pattern, Forwards Number of Stairs: 8 General stair comments: safe with the rail  Wheelchair Mobility    Modified Rankin (Stroke Patients Only)       Balance Overall balance assessment: Mild deficits observed, not formally tested                               Standardized Balance Assessment Standardized Balance Assessment : Dynamic Gait Index   Dynamic Gait Index Level Surface: Normal Change in Gait Speed: Normal Gait with Horizontal Head Turns: Mild Impairment Gait with Vertical Head Turns: Normal Gait and Pivot Turn: Mild Impairment Step Over Obstacle: Normal Step Around Obstacles: Mild Impairment Steps: Mild Impairment Total Score: 20       Pertinent Vitals/Pain Pain Assessment Pain Assessment: No/denies pain    Home Living Family/patient expects to be discharged to:: Private residence Living Arrangements: Spouse/significant other Available Help at Discharge: Family;Available 24 hours/day Type of Home: House Home Access: Stairs to enter Entrance Stairs-Rails: Can reach both Entrance Stairs-Number of Steps: 1+2   Home Layout: One level Home Equipment: Grab bars - tub/shower      Prior Function Prior Level of Function : Independent/Modified Independent;Driving               ADLs Comments: yardwork (mows yard),     Hand Dominance   Dominant Hand: Right    Extremity/Trunk Assessment  Upper Extremity Assessment Upper Extremity Assessment: Overall WFL for tasks assessed    Lower Extremity Assessment Lower Extremity Assessment: Overall WFL for tasks assessed       Communication   Communication: No difficulties  Cognition Arousal/Alertness: Awake/alert Behavior During Therapy: WFL for tasks assessed/performed Overall Cognitive Status: Within Functional Limits for tasks assessed                                  General Comments: appears WFL, some decreased STM but anticipate age related        General Comments General comments (skin integrity, edema, etc.): low risk of falls, but family to monitor for short period in outside environment and monitor driving before Independent driving.    Exercises     Assessment/Plan    PT Assessment Patient does not need any further PT services  PT Problem List Decreased balance       PT Treatment Interventions      PT Goals (Current goals can be found in the Care Plan section)  Acute Rehab PT Goals PT Goal Formulation: All assessment and education complete, DC therapy    Frequency       Co-evaluation               AM-PAC PT "6 Clicks" Mobility  Outcome Measure Help needed turning from your back to your side while in a flat bed without using bedrails?: None Help needed moving from lying on your back to sitting on the side of a flat bed without using bedrails?: None Help needed moving to and from a bed to a chair (including a wheelchair)?: None Help needed standing up from a chair using your arms (e.g., wheelchair or bedside chair)?: None Help needed to walk in hospital room?: None Help needed climbing 3-5 steps with a railing? : None 6 Click Score: 24    End of Session   Activity Tolerance: Patient tolerated treatment well Patient left: in bed;with call bell/phone within reach;with family/visitor present Nurse Communication: Mobility status PT Visit Diagnosis: Other abnormalities of gait and mobility (R26.89)    Time: 1040-1100 PT Time Calculation (min) (ACUTE ONLY): 20 min   Charges:   PT Evaluation $PT Eval Low Complexity: 1 Low          08/27/2021  Ginger Carne., PT Acute Rehabilitation Services [phone]  (pager) [phone]  (office)  Tessie Fass Emillia Weatherly 08/27/2021, 11:14 AM

## 2021-08-27 NOTE — Progress Notes (Incomplete)
PROGRESS NOTE   Larry [name]  KKX:[id number] DOB: 08/22/1929 DOA: 08/26/2021 PCP: Tonia Ghent, MD  Brief Narrative:  88 year old home dwelling white male reflux, HLD, hypothyroid prior TIA 04/2019 with transient aphasia--also similar episodes after COVID booster Code stroke was called by the emergency room NIHSS 1 CT head no acute abnormality CTA head neck with perfusion no LVO no perfusion deficit Neurology saw the patient in consult and recommended EEG to rule out seizures MRI brain without contrast-premedicate Haldol Ativan secondary to claustrophobia Started aspirin 325  Hospital-Problem based course    DVT prophylaxis:  Code Status:  Family Communication:  Disposition:  Status is: Inpatient {Inpatient:23812}   Consultants:  ***  Procedures: ***  Antimicrobials: ***    Subjective: ***  Objective: Vitals:   08/26/21 2045 08/26/21 2114 08/27/21 0001 08/27/21 0350  BP: 131/64 (!) 145/86 104/68 109/70  Pulse: 69 75 61 60  Resp: (!) '23 14 17 19  '$ Temp:  99.2 F (37.3 C) 98 F (36.7 C) 97.6 F (36.4 C)  TempSrc:  Oral Oral Oral  SpO2: 94% 98% 98% 96%  Weight:      Height:        Intake/Output Summary (Last 24 hours) at 08/27/2021 0749 Last data filed at 08/27/2021 0600 Gross per 24 hour  Intake --  Output 700 ml  Net -700 ml   Filed Weights   08/26/21 1401  Weight: 68 kg    Examination:    Data Reviewed: personally reviewed   CBC    Component Value Date/Time   WBC 4.9 08/27/2021 0256   RBC 4.37 08/27/2021 0256   HGB 14.2 08/27/2021 0256   HCT 41.1 08/27/2021 0256   PLT 122 (L) 08/27/2021 0256   MCV 94.1 08/27/2021 0256   MCH 32.5 08/27/2021 0256   MCHC 34.5 08/27/2021 0256   RDW 13.4 08/27/2021 0256   LYMPHSABS 0.8 08/26/2021 1442   MONOABS 0.3 08/26/2021 1442   EOSABS 0.0 08/26/2021 1442   BASOSABS 0.0 08/26/2021 1442      Latest Ref Rng & Units 08/27/2021    2:56 AM 08/26/2021    2:42 PM 08/26/2021    2:17 PM  CMP  Glucose 70 - 99  mg/dL 107  163  165   BUN 8 - 23 mg/dL '12  9  10   '$ Creatinine 0.61 - 1.24 mg/dL 0.67  0.74  0.70   Sodium 135 - 145 mmol/L 134  133  133   Potassium 3.5 - 5.1 mmol/L 3.7  3.9  3.7   Chloride 98 - 111 mmol/L 104  100  97   CO2 22 - 32 mmol/L 24  24    Calcium 8.9 - 10.3 mg/dL 8.5  8.1    Total Protein 6.5 - 8.1 g/dL  5.9    Total Bilirubin 0.3 - 1.2 mg/dL  0.7    Alkaline Phos 38 - 126 U/L  53    AST 15 - 41 U/L  23    ALT 0 - 44 U/L  14       Radiology Studies: MR BRAIN WO CONTRAST  Result Date: 08/27/2021 CLINICAL DATA:  Transient ischemic attack EXAM: MRI HEAD WITHOUT CONTRAST TECHNIQUE: Multiplanar, multiecho pulse sequences of the brain and surrounding structures were obtained without intravenous contrast. COMPARISON:  None Available. FINDINGS: Brain: No acute infarct, mass effect or extra-axial collection. Fewer than 5 scattered microhemorrhages in a nonspecific pattern. There is multifocal hyperintense T2-weighted signal within the white matter. Generalized cerebral volume loss. The  midline structures are normal. Vascular: Major flow voids are preserved. Skull and upper cervical spine: Normal calvarium and skull base. Visualized upper cervical spine and soft tissues are normal. Sinuses/Orbits:Mild right maxillary sinus mucosal thickening. Normal orbits. IMPRESSION: 1. No acute intracranial abnormality. 2. Findings of chronic small vessel ischemia and cerebral volume loss. Electronically Signed   By: Ulyses Jarred M.D.   On: 08/27/2021 02:31   EEG adult  Result Date: 08/26/2021 Lora Havens, MD     08/26/2021  8:18 PM Patient Name: Larry [name] MRN: [id number] Epilepsy Attending: Lora Havens Referring Physician/Provider: August Albino, NP Date: 08/26/2021 Duration: 24.55 mins Patient history:  88 y.o. male with past medical history HLD, GERD. Hypothyroidism who presents [facility] ED via EMS for evaluation of persistent headache since yesterday and aphasia. EEG to evaluate for  seizure. Level of alertness: Awake AEDs during EEG study: None Technical aspects: This EEG study was done with scalp electrodes positioned according to the 10-20 International system of electrode placement. Electrical activity was acquired at a sampling rate of '500Hz'$  and reviewed with a high frequency filter of '70Hz'$  and a low frequency filter of '1Hz'$ . EEG data were recorded continuously and digitally stored. Description: The posterior dominant rhythm consists of 8-9 Hz activity of moderate voltage (25-35 uV) seen predominantly in posterior head regions, symmetric and reactive to eye opening and eye closing. EEG showed continuous 3 to 6 Hz theta-delta slowing in left fronto-temporal region.  Hyperventilation and photic stimulation were not performed.   ABNORMALITY - Continuous slow, left fronto-temporal region IMPRESSION: This study is suggestive of cortical dysfunction arising from left fronto-temporal likely secondary to underlying structural abnormality, post-ictal state. No seizures or epileptiform discharges were seen throughout the recording. Priyanka Barbra Sarks   CT ANGIO HEAD NECK W WO CM W PERF (CODE STROKE)  Result Date: 08/26/2021 CLINICAL DATA:  Neuro deficit, acute, stroke suspected EXAM: CT ANGIOGRAPHY HEAD AND NECK CT PERFUSION BRAIN TECHNIQUE: Multidetector CT imaging of the head and neck was performed using the standard protocol during bolus administration of intravenous contrast. Multiplanar CT image reconstructions and MIPs were obtained to evaluate the vascular anatomy. Carotid stenosis measurements (when applicable) are obtained utilizing NASCET criteria, using the distal internal carotid diameter as the denominator. Multiphase CT imaging of the brain was performed following IV bolus contrast injection. Subsequent parametric perfusion maps were calculated using RAPID software. RADIATION DOSE REDUCTION: This exam was performed according to the departmental dose-optimization program which includes  automated exposure control, adjustment of the mA and/or kV according to patient size and/or use of iterative reconstruction technique. CONTRAST:  129m OMNIPAQUE IOHEXOL 350 MG/ML SOLN COMPARISON:  None Available. FINDINGS: CTA NECK FINDINGS Aortic arch: Great vessel origins are patent without significant stenosis. Right carotid system: No evidence of dissection, stenosis (50% or greater) or occlusion. Left carotid system: No evidence of dissection, stenosis (50% or greater) or occlusion. Vertebral arteries: Codominant. No evidence of dissection, stenosis (50% or greater) or occlusion. Skeleton: Moderate multilevel degenerative change. Other neck: No acute abnormality. Upper chest: Biapical pleuroparenchymal scarring. Visualized lung apices are otherwise clear. Review of the MIP images confirms the above findings CTA HEAD FINDINGS Anterior circulation: Bilateral intracranial ICAs, MCAs, and ACAs are patent without proximal hemodynamically significant stenosis. Hypoplastic right A1 ACA comparable congenital. Small (1-2 mm) inferiorly directed outpouching arising from the supraclinoid left ICA (series 8, image 102; series 10, image 23), compatible with small aneurysm versus infundibulum with vessel not well seen. Posterior circulation: Bilateral intradural vertebral  arteries, basilar artery, and bilateral posterior cerebral arteries are patent. Moderate stenosis of the proximal left P2 PCA. CT Brain Perfusion Findings: ASPECTS: 10 CBF (<30%) Volume: 56m Perfusion (Tmax>6.0s) volume: 068mMismatch Volume: 79m37mnfarction Location:None identified. IMPRESSION: 1. No emergent large vessel occlusion. 2. Moderate left P2 PCA stenosis. 3. No evidence of core infarct or penumbra on perfusion. 4. 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 Electronically Signed   By: FreMargaretha SheffieldD.   On: 08/26/2021  14:53   CT HEAD CODE STROKE WO CONTRAST  Result Date: 08/26/2021 CLINICAL DATA:  Code stroke. EXAM: CT HEAD WITHOUT CONTRAST TECHNIQUE: Contiguous axial images were obtained from the base of the skull through the vertex without intravenous contrast. RADIATION DOSE REDUCTION: This exam was performed according to the departmental dose-optimization program which includes automated exposure control, adjustment of the mA and/or kV according to patient size and/or use of iterative reconstruction technique. COMPARISON:  05/09/2019 FINDINGS: Brain: No evidence of acute infarction, hemorrhage, cerebral edema, mass, mass effect, or midline shift. Ventricles and sulci are normal for age. No extra-axial fluid collection. Periventricular white matter changes, likely the sequela of chronic small vessel ischemic disease. Vascular: No hyperdense vessel. Skull: Negative for fracture or focal lesion. Sinuses/Orbits: Partial opacification of the right-greater-than-left maxillary sinus. Status post bilateral lens replacements. Other: The mastoid air cells are well aerated. ASPECTS ([facility] Early CT Score) - Ganglionic level infarction (caudate, lentiform nuclei, internal capsule, insula, M1-M3 cortex): 7 - Supraganglionic infarction (M4-M6 cortex): 3 Total score (0-10 with 10 being normal): 10 IMPRESSION: 1. No acute intracranial process. 2. ASPECTS is 10 Code stroke imaging results were communicated on 08/26/2021 at 2:23 pm to provider Dr. SetLeonie Mana secure text paging. Electronically Signed   By: AliMerilyn BabaD.   On: 08/26/2021 14:24     Scheduled Meds:   stroke: early stages of recovery book   Does not apply Once   aspirin  300 mg Rectal Daily   Or   aspirin  325 mg Oral Daily   haloperidol lactate  1 mg Intravenous Once   levothyroxine  50 mcg Oral QAC breakfast   LORazepam  1 mg Intravenous Once   sodium chloride flush  3 mL Intravenous Q12H   Continuous Infusions:  sodium chloride       LOS: 1  day   Time spent: ***  JaiNita SellsD [facility] To contact the attending provider between 7A-7P or the covering provider during after hours 7P-7A, please log into the web site [email or web address] and access using universal [place] password for that web site. If you do not have the password, please call the hospital operator.  08/27/2021, 7:49 AM

## 2021-08-27 NOTE — TOC Transition Note (Signed)
Transition of Care [facility]) - CM/SW Discharge Note   Patient Details  Name: Prakash Kimberling MRN: [id number] Date of Birth: Mar 24, 1929  [facility] CM/SW Contact:  Pollie Friar, RN Phone Number: 08/27/2021, 11:33 AM   Clinical Narrative:    Patient is discharging home with self care. No f/u per PT/OT and no DME needs.  Pt has transportation home.    Final next level of care: Home/Self Care Barriers to Discharge: No Barriers Identified   Patient Goals and CMS Choice        Discharge Placement                       Discharge Plan and Services                                     Social Determinants of Health (SDOH) Interventions     Readmission Risk Interventions     No data to display

## 2021-08-27 NOTE — Progress Notes (Signed)
  Echocardiogram 2D Echocardiogram has been performed.  Larry [name] 08/27/2021, 10:39 AM

## 2021-08-27 NOTE — Progress Notes (Signed)
IV removal well tolerated, discharge orders explained, teach back complete, daughter to transfer patient after patient has speech evaluation and OT.

## 2021-08-27 NOTE — Care Management Obs Status (Signed)
[facility] NOTIFICATION   Patient Details  Name: Larry [name] MRN: [id number] Date of Birth: 1929/06/05   Medicare Observation Status Notification Given:  Yes    Pollie Friar, RN 08/27/2021, 11:34 AM

## 2021-08-27 NOTE — Discharge Summary (Signed)
Physician Discharge Summary  Larry [name] KYH:[id number] DOB: September 19, 1929 DOA: 08/26/2021  PCP: Tonia Ghent, MD  Admit date: 08/26/2021 Discharge date: 08/27/2021  Time spent: 33 minutes  Recommendations for Outpatient Follow-up:  Referral to neurologist/neurocognitive testing needed in the outpatient setting to rule out complicated migraine versus underlying mild cognitive issues Please follow echocardiogram for completion sake although this was not a stroke  Discharge Diagnoses:  MAIN problem for hospitalization   Transient confusion?  Complicated migraine  Please see below for itemized issues addressed in HOpsital- refer to other progress notes for clarity if needed  Discharge Condition: Improved  Diet recommendation: Heart healthy  Filed Weights   08/26/21 1401  Weight: 68 kg    History of present illness:  88 year old home dwelling white male reflux, HLD, hypothyroid prior TIA 04/2019 with transient aphasia--also similar episodes after COVID booster Code stroke was called by the emergency room NIHSS 1 CT head no acute abnormality CTA head neck with perfusion no LVO no perfusion deficit Neurology saw the patient in consult and recommended EEG to rule out seizures MRI brain without contrast-premedicate Haldol Ativan secondary to claustrophobia Started aspirin 325  Neurology evaluated the patient on admission and subsequently-full work-up was completed MRI brain was negative for any stroke EEG did not show any specific epileptiform discharges-neurology recommended to continue 81 mg of aspirin with no further work-up  Echocardiogram was performed this hospitalization but was pending at time of discharge-it was felt that patient maximally benefited from hospitalization  As an outpatient may benefit from consideration of PCSK9 inhibitor versus Crestor-he should follow-up with his rheumatologist for Alph-gal as he is on methotrexate   Discharge Exam: Vitals:   08/27/21  0350 08/27/21 0839  BP: 109/70 127/65  Pulse: 60 60  Resp: 19 18  Temp: 97.6 F (36.4 C) 98.4 F (36.9 C)  SpO2: 96% 95%    Subj on day of d/c     awake coherent speaking in full sentences and no issues Was able to walk around the room fairly comfortably No other deficits   General Exam on discharge  EOMI NCAT no focal deficit CTA B no added sound rales or rhonchi S1-S2 no murmur no rub no gallop ROM intact moving 4 limbs equally Abdomen soft Sensory intact Reflexes deferred   Discharge Instructions   Discharge Instructions     Diet - low sodium heart healthy   Complete by: As directed    Discharge instructions   Complete by: As directed    You were diagnosed with possible complicated migraine which may have caused some of your transient speech issues-I have not made any changes to your medication It does not appear that neurology feels you need to do anything differently or change any medications currently-instead I would asked that your primary care physician refer you to a neurologist for a complete work-up as well as further testing as sometimes delirium and other issues can present in this way although it is less likely You do need to present back to the emergency room if you have recurrent issues as it is hard at your age with risk factors to rule out completely stroke The echocardiogram that was performed this hospitalization is not back yet but should not have any bearing on your current symptoms or findings Best of luck   Increase activity slowly   Complete by: As directed       Allergies as of 08/27/2021       Reactions   Alpha-gal Other (See Comments)  Sulfur Itching   Antihistamines, Diphenhydramine-type    Urinary retention   Atorvastatin    REACTION: Intolerance   Beef-derived Products    Tolerates chicken/fish/turkey   Ezetimibe    REACTION: Intolerance   Other    ALPHAGAL- see above re: beef allergy   Pork-derived Products Other (See  Comments)   Due to alpha gal hx   Simvastatin    REACTION: Intolerance   Sulfonamide Derivatives    REACTION: hives, itching   Tramadol Other (See Comments)   Lightheaded        Medication List     STOP taking these medications    ALPRAZolam 0.25 MG tablet Commonly known as: XANAX       TAKE these medications    Anbesol 10 % mucosal gel Generic drug: benzocaine Use as directed 1 application in the mouth or throat daily as needed for mouth pain.   aspirin EC 81 MG tablet Take 1 tablet (81 mg total) by mouth daily. Swallow whole.   calcium carbonate 500 MG chewable tablet Commonly known as: TUMS - dosed in mg elemental calcium Chew 1 tablet by mouth daily as needed for indigestion or heartburn.   CO Q 10 PO Take 1 capsule by mouth daily.   diclofenac sodium 1 % Gel Commonly known as: VOLTAREN Apply 2 g topically 4 (four) times daily as needed.   EPINEPHrine 0.3 mg/0.3 mL Soaj injection Commonly known as: EPI-PEN INJECT 0.3 MLS (1 PEN) INTO THE MUSCLE ONCE. AS NEEDED   FISH OIL PO Take 1 tablet by mouth daily.   folic acid 1 MG tablet Commonly known as: FOLVITE Take 1 mg by mouth daily.   KRILL OIL PO Take 1 tablet by mouth daily.   methotrexate 2.5 MG tablet Commonly known as: RHEUMATREX Take 2 tablets (5 mg total) by mouth once a week. Caution:Chemotherapy. Protect from light.   RED YEAST RICE PO Take 1 tablet by mouth daily.   Synthroid 50 MCG tablet Generic drug: levothyroxine TAKE 1 TABLET BY MOUTH  DAILY BEFORE BREAKFAST       Allergies  Allergen Reactions   Alpha-Gal Other (See Comments)   Sulfur Itching   Antihistamines, Diphenhydramine-Type     Urinary retention   Atorvastatin     REACTION: Intolerance   Beef-Derived Products     Tolerates chicken/fish/turkey   Ezetimibe     REACTION: Intolerance   Other     ALPHAGAL- see above re: beef allergy   Pork-Derived Products Other (See Comments)    Due to alpha gal hx   Simvastatin      REACTION: Intolerance   Sulfonamide Derivatives     REACTION: hives, itching   Tramadol Other (See Comments)    Lightheaded      The results of significant diagnostics from this hospitalization (including imaging, microbiology, ancillary and laboratory) are listed below for reference.    Significant Diagnostic Studies: MR BRAIN WO CONTRAST  Result Date: 08/27/2021 CLINICAL DATA:  Transient ischemic attack EXAM: MRI HEAD WITHOUT CONTRAST TECHNIQUE: Multiplanar, multiecho pulse sequences of the brain and surrounding structures were obtained without intravenous contrast. COMPARISON:  None Available. FINDINGS: Brain: No acute infarct, mass effect or extra-axial collection. Fewer than 5 scattered microhemorrhages in a nonspecific pattern. There is multifocal hyperintense T2-weighted signal within the white matter. Generalized cerebral volume loss. The midline structures are normal. Vascular: Major flow voids are preserved. Skull and upper cervical spine: Normal calvarium and skull base. Visualized upper cervical spine and soft tissues are  normal. Sinuses/Orbits:Mild right maxillary sinus mucosal thickening. Normal orbits. IMPRESSION: 1. No acute intracranial abnormality. 2. Findings of chronic small vessel ischemia and cerebral volume loss. Electronically Signed   By: Ulyses Jarred M.D.   On: 08/27/2021 02:31   EEG adult  Result Date: 08/26/2021 Lora Havens, MD     08/26/2021  8:18 PM Patient Name: Larry [name] MRN: [id number] Epilepsy Attending: Lora Havens Referring Physician/Provider: August Albino, NP Date: 08/26/2021 Duration: 24.55 mins Patient history:  88 y.o. male with past medical history HLD, GERD. Hypothyroidism who presents to [facility] ED via EMS for evaluation of persistent headache since yesterday and aphasia. EEG to evaluate for seizure. Level of alertness: Awake AEDs during EEG study: None Technical aspects: This EEG study was done with scalp electrodes positioned according to  the 10-20 International system of electrode placement. Electrical activity was acquired at a sampling rate of '500Hz'$  and reviewed with a high frequency filter of '70Hz'$  and a low frequency filter of '1Hz'$ . EEG data were recorded continuously and digitally stored. Description: The posterior dominant rhythm consists of 8-9 Hz activity of moderate voltage (25-35 uV) seen predominantly in posterior head regions, symmetric and reactive to eye opening and eye closing. EEG showed continuous 3 to 6 Hz theta-delta slowing in left fronto-temporal region.  Hyperventilation and photic stimulation were not performed.   ABNORMALITY - Continuous slow, left fronto-temporal region IMPRESSION: This study is suggestive of cortical dysfunction arising from left fronto-temporal likely secondary to underlying structural abnormality, post-ictal state. No seizures or epileptiform discharges were seen throughout the recording. Priyanka Barbra Sarks   CT ANGIO HEAD NECK W WO CM W PERF (CODE STROKE)  Result Date: 08/26/2021 CLINICAL DATA:  Neuro deficit, acute, stroke suspected EXAM: CT ANGIOGRAPHY HEAD AND NECK CT PERFUSION BRAIN TECHNIQUE: Multidetector CT imaging of the head and neck was performed using the standard protocol during bolus administration of intravenous contrast. Multiplanar CT image reconstructions and MIPs were obtained to evaluate the vascular anatomy. Carotid stenosis measurements (when applicable) are obtained utilizing NASCET criteria, using the distal internal carotid diameter as the denominator. Multiphase CT imaging of the brain was performed following IV bolus contrast injection. Subsequent parametric perfusion maps were calculated using RAPID software. RADIATION DOSE REDUCTION: This exam was performed according to the departmental dose-optimization program which includes automated exposure control, adjustment of the mA and/or kV according to patient size and/or use of iterative reconstruction technique. CONTRAST:  113m  OMNIPAQUE IOHEXOL 350 MG/ML SOLN COMPARISON:  None Available. FINDINGS: CTA NECK FINDINGS Aortic arch: Great vessel origins are patent without significant stenosis. Right carotid system: No evidence of dissection, stenosis (50% or greater) or occlusion. Left carotid system: No evidence of dissection, stenosis (50% or greater) or occlusion. Vertebral arteries: Codominant. No evidence of dissection, stenosis (50% or greater) or occlusion. Skeleton: Moderate multilevel degenerative change. Other neck: No acute abnormality. Upper chest: Biapical pleuroparenchymal scarring. Visualized lung apices are otherwise clear. Review of the MIP images confirms the above findings CTA HEAD FINDINGS Anterior circulation: Bilateral intracranial ICAs, MCAs, and ACAs are patent without proximal hemodynamically significant stenosis. Hypoplastic right A1 ACA comparable congenital. Small (1-2 mm) inferiorly directed outpouching arising from the supraclinoid left ICA (series 8, image 102; series 10, image 23), compatible with small aneurysm versus infundibulum with vessel not well seen. Posterior circulation: Bilateral intradural vertebral arteries, basilar artery, and bilateral posterior cerebral arteries are patent. Moderate stenosis of the proximal left P2 PCA. CT Brain Perfusion Findings: ASPECTS: 10 CBF (<30%) Volume: 076m  Perfusion (Tmax>6.0s) volume: 25m Mismatch Volume: 081mInfarction Location:None identified. IMPRESSION: 1. No emergent large vessel occlusion. 2. Moderate left P2 PCA stenosis. 3. No evidence of core infarct or penumbra on perfusion. 4. 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 Electronically Signed   By: FrMargaretha Sheffield.D.   On: 08/26/2021 14:53   CT HEAD CODE STROKE WO CONTRAST  Result Date: 08/26/2021 CLINICAL DATA:  Code stroke. EXAM: CT HEAD WITHOUT CONTRAST TECHNIQUE: Contiguous axial  images were obtained from the base of the skull through the vertex without intravenous contrast. RADIATION DOSE REDUCTION: This exam was performed according to the departmental dose-optimization program which includes automated exposure control, adjustment of the mA and/or kV according to patient size and/or use of iterative reconstruction technique. COMPARISON:  05/09/2019 FINDINGS: Brain: No evidence of acute infarction, hemorrhage, cerebral edema, mass, mass effect, or midline shift. Ventricles and sulci are normal for age. No extra-axial fluid collection. Periventricular white matter changes, likely the sequela of chronic small vessel ischemic disease. Vascular: No hyperdense vessel. Skull: Negative for fracture or focal lesion. Sinuses/Orbits: Partial opacification of the right-greater-than-left maxillary sinus. Status post bilateral lens replacements. Other: The mastoid air cells are well aerated. ASPECTS ([facility] Early [facility] Score) - Ganglionic level infarction (caudate, lentiform nuclei, internal capsule, insula, M1-M3 cortex): 7 - Supraganglionic infarction (M4-M6 cortex): 3 Total score (0-10 with 10 being normal): 10 IMPRESSION: 1. No acute intracranial process. 2. ASPECTS is 10 Code stroke imaging results were communicated on 08/26/2021 at 2:23 pm to provider Dr. SeLeonie Mania secure text paging. Electronically Signed   By: AlMerilyn Baba.D.   On: 08/26/2021 14:24    Microbiology: Recent Results (from the past 240 hour(s))  Resp Panel by RT-PCR (Flu A&B, Covid) Anterior Nasal Swab     Status: None   Collection Time: 08/26/21  2:02 PM   Specimen: Anterior Nasal Swab  Result Value Ref Range Status   SARS Coronavirus 2 by RT PCR NEGATIVE NEGATIVE Final    Comment: (NOTE) SARS-CoV-2 target nucleic acids are NOT DETECTED.  The SARS-CoV-2 RNA is generally detectable in upper respiratory specimens during the acute phase of infection. The lowest concentration of SARS-CoV-2 viral copies this  assay can detect is 138 copies/mL. A negative result does not preclude SARS-Cov-2 infection and should not be used as the sole basis for treatment or other patient management decisions. A negative result may occur with  improper specimen collection/handling, submission of specimen other than nasopharyngeal swab, presence of viral mutation(s) within the areas targeted by this assay, and inadequate number of viral copies(<138 copies/mL). A negative result must be combined with clinical observations, patient history, and epidemiological information. The expected result is Negative.  Fact Sheet for Patients:  htEntrepreneurPulse.com.auFact Sheet for Healthcare Providers:  htIncredibleEmployment.beThis test is no t yet approved or cleared by the [place] and  has been authorized for detection and/or diagnosis of SARS-CoV-2 by FDA under an Emergency Use Authorization (EUA). This EUA will remain  in effect (meaning this test can be used) for the duration of the COVID-19 declaration under Section 564(b)(1) of the Act, 21 U.S.C.section 360bbb-3(b)(1), unless the authorization is terminated  or revoked sooner.       Influenza A by PCR NEGATIVE NEGATIVE Final   Influenza B by PCR NEGATIVE NEGATIVE Final    Comment: (NOTE) The Xpert Xpress SARS-CoV-2/FLU/RSV plus assay is  intended as an aid in the diagnosis of influenza from Nasopharyngeal swab specimens and should not be used as a sole basis for treatment. Nasal washings and aspirates are unacceptable for Xpert Xpress SARS-CoV-2/FLU/RSV testing.  Fact Sheet for Patients: EntrepreneurPulse.com.au  Fact Sheet for Healthcare Providers: IncredibleEmployment.be  This test is not yet approved or cleared by the [place] FDA and has been authorized for detection and/or diagnosis of SARS-CoV-2 by FDA under an Emergency Use Authorization (EUA). This EUA will  remain in effect (meaning this test can be used) for the duration of the COVID-19 declaration under Section 564(b)(1) of the Act, 21 U.S.C. section 360bbb-3(b)(1), unless the authorization is terminated or revoked.  Performed at [facility], [place] [address], [place],  [address]      Labs: Basic Metabolic Panel: Recent Labs  Lab 08/26/21 1417 08/26/21 1442 08/27/21 0256  NA 133* 133* 134*  K 3.7 3.9 3.7  CL 97* 100 104  CO2  --  24 24  GLUCOSE 165* 163* 107*  BUN '10 9 12  '$ CREATININE 0.70 0.74 0.67  CALCIUM  --  8.1* 8.5*   Liver Function Tests: Recent Labs  Lab 08/26/21 1442  AST 23  ALT 14  ALKPHOS 53  BILITOT 0.7  PROT 5.9*  ALBUMIN 3.5   No results for input(s): "LIPASE", "AMYLASE" in the last 168 hours. No results for input(s): "AMMONIA" in the last 168 hours. CBC: Recent Labs  Lab 08/26/21 1417 08/26/21 1442 08/27/21 0256  WBC  --  4.7 4.9  NEUTROABS  --  3.6  --   HGB 14.6 15.0 14.2  HCT 43.0 43.0 41.1  MCV  --  95.3 94.1  PLT  --  119* 122*   Cardiac Enzymes: No results for input(s): "CKTOTAL", "CKMB", "CKMBINDEX", "TROPONINI" in the last 168 hours. BNP: BNP (last 3 results) No results for input(s): "BNP" in the last 8760 hours.  ProBNP (last 3 results) No results for input(s): "PROBNP" in the last 8760 hours.  CBG: Recent Labs  Lab 08/26/21 1407  GLUCAP 181*       Signed:  Nita Sells MD   [facility] 08/27/2021, 10:46 AM

## 2021-08-30 ENCOUNTER — Ambulatory Visit: Payer: Medicare Other | Admitting: Family Medicine

## 2021-08-30 ENCOUNTER — Encounter: Payer: Self-pay | Admitting: Family Medicine

## 2021-08-30 ENCOUNTER — Telehealth: Payer: Self-pay | Admitting: Family Medicine

## 2021-08-30 VITALS — BP 150/78 | HR 78 | Temp 98.0°F | Ht 65.0 in | Wt 152.0 lb

## 2021-08-30 DIAGNOSIS — R519 Headache, unspecified: Secondary | ICD-10-CM

## 2021-08-30 NOTE — Telephone Encounter (Signed)
Looks like patient was scheduled for appt today at 10 am with Dr. Damita Dunnings.

## 2021-08-30 NOTE — Telephone Encounter (Signed)
Patient was seen at [facility] on Thursday 06/08 and was told to follow up with PCP. I do not see any openings.Please call patient for TCME call and schedule appointment.

## 2021-08-30 NOTE — Patient Instructions (Signed)
Don't change your meds for now.  We'll call about seeing neuro.   I presume this was headache related.  Take care.  Glad to see you.

## 2021-08-30 NOTE — Progress Notes (Unsigned)
He had a HA for about 2-3 days.  It got to the point he couldn't get speak clearly.  He could make some oral noise but it wasn't normal.   Daughter, and granddaughter have had episodes of similar sx, ie headaches with other concurrent neurologic sx in the midst of HA.    IMPRESSION: 1. No acute intracranial abnormality. 2. Findings of chronic small vessel ischemia and cerebral volume loss.  Echo was unremarkable except for G1 diastolic dysfxn. Aortic dilatation noted. There is mild dilatation of the ascending aorta, measuring 39 mm.   CTA with  IMPRESSION: 1. No emergent large vessel occlusion. 2. Moderate left P2 PCA stenosis. 3. No evidence of core infarct or penumbra on perfusion. 4. 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  He had prev inc his RYR and LDL dropped from 184---->112.

## 2021-09-01 NOTE — Assessment & Plan Note (Signed)
Per inpatient notes, he did not have a stroke.  Discussed that this could have been a complicated migraine and I think it makes sense to see neurology.  I will defer that he saw neurology first prior to considering any change in his cholesterol medications at this point.  He agrees with plan.  34 minutes were devoted to patient care in this encounter (this includes time spent reviewing the patient's file/history, interviewing and examining the patient, counseling/reviewing plan with patient).

## 2021-09-03 ENCOUNTER — Telehealth: Payer: Self-pay

## 2021-09-03 NOTE — Telephone Encounter (Signed)
Transition Care Management Follow-up Telephone Call Date of discharge and from where: 08/27/21 / [name] How have you been since you were released from the hospital? " Back to the way I was" Any questions or concerns? No  Items Reviewed: Did the pt receive and understand the discharge instructions provided? Yes  Medications obtained and verified? Yes  Other?  N/a Any new allergies since your discharge? No  Dietary orders reviewed? Yes Do you have support at home? Yes   Home Care and Equipment/Supplies: Were home health services ordered? no If so, what is the name of the agency?   Has the agency set up a time to come to the patient's home? N/a Were any new equipment or medical supplies ordered?  No What is the name of the medical supply agency?  Were you able to get the supplies/equipment? not applicable Do you have any questions related to the use of the equipment or supplies? N/a  Functional Questionnaire: (I = Independent and D = Dependent) ADLs: I  Bathing/Dressing- I  Meal Prep- I  Eating- I  Maintaining continence- I  Transferring/Ambulation- I  Managing Meds- I  Follow up appointments reviewed:  PCP Hospital f/u appt confirmed? Yes  Scheduled to see Dr. Elsie [name] on 08/30/21 @ 10:00am. [name] f/u appt confirmed? No  Scheduled to see:  Patient states his primary care provider referred him to a neurologist.  Are transportation arrangements needed? No  If their condition worsens, is the pt aware to call PCP or go to the Emergency Dept.? Yes Was the patient provided with contact information for the PCP's office or ED? Yes Was to pt encouraged to call back with questions or concerns? Yes   Quinn Plowman RN,BSN,CCM RN Case Manager [facility]  [phone]

## 2021-09-28 NOTE — Progress Notes (Unsigned)
Referring:  Tonia Ghent, MD [address] [place],  [place] [address]  PCP: Tonia Ghent, MD  Neurology was asked to evaluate Larry [name], a 88 year old male for a chief complaint of headaches.  Our recommendations of care will be communicated by shared medical record.    CC:  word finding difficulty  History provided from self  HPI:  Medical co-morbidities: ?TIA (multiple confusional episodes), GI bleed, hypothyroidism, HLD, alpha-gal allergy  The patient presented to the ED on 08/26/21 for confusion and speech difficulty. He did develop a headache a few days prior to this episode. Took OTCs for the headache but it lasted for 2-3 days. Notes that he rarely get headaches but cannot provide much further detail on them. Denies photophobia, nausea, or phonophobia.  After headache onset he developed an episode where he was unable to get his words out for about 10 minutes. He did understand what other people were saying to him. No numbness, weakness, vision changes, or loss of consciousness. Speech improved by the time he presented to the ED. MRI brain without contrast showed generalized volume loss and no acute process. 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 TTE with EF of 55%, no shunt. EEG showed left fronto-temporal slowing. He is currently taking ASA 81 mg daily and cannot take statins due to intolerance (myalgias). Episode was suspected to be secondary to migraine.  He has had 3 similar episodes previously which all occurred after receiving vaccines. States last episode was 6 months ago. States that each time he struggled with getting his words out. He does not recall if he had headaches these other times, however per documentation he did have a headache with his last episode.  LABS: CBC    Component Value Date/Time   WBC 4.9 08/27/2021 0256   RBC 4.37 08/27/2021 0256    HGB 14.2 08/27/2021 0256   HCT 41.1 08/27/2021 0256   PLT 122 (L) 08/27/2021 0256   MCV 94.1 08/27/2021 0256   MCH 32.5 08/27/2021 0256   MCHC 34.5 08/27/2021 0256   RDW 13.4 08/27/2021 0256   LYMPHSABS 0.8 08/26/2021 1442   MONOABS 0.3 08/26/2021 1442   EOSABS 0.0 08/26/2021 1442   BASOSABS 0.0 08/26/2021 1442      Latest Ref Rng & Units 08/27/2021    2:56 AM 08/26/2021    2:42 PM 08/26/2021    2:17 PM  CMP  Glucose 70 - 99 mg/dL 107  163  165   BUN 8 - 23 mg/dL '12  9  10   '$ Creatinine 0.61 - 1.24 mg/dL 0.67  0.74  0.70   Sodium 135 - 145 mmol/L 134  133  133   Potassium 3.5 - 5.1 mmol/L 3.7  3.9  3.7   Chloride 98 - 111 mmol/L 104  100  97   CO2 22 - 32 mmol/L 24  24    Calcium 8.9 - 10.3 mg/dL 8.5  8.1    Total Protein 6.5 - 8.1 g/dL  5.9    Total Bilirubin 0.3 - 1.2 mg/dL  0.7    Alkaline Phos 38 - 126 U/L  53    AST 15 - 41 U/L  23    ALT 0 - 44 U/L  14       IMAGING:  MRI brain 08/27/21: 1. No acute intracranial abnormality. 2. Findings of chronic small vessel ischemia and cerebral  volume loss.  CTA head/neck 08/26/21: 1. No emergent large vessel occlusion. 2. Moderate left P2 PCA stenosis. 3. No evidence of core infarct or penumbra on perfusion. 4. 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  Imaging independently reviewed on September 29, 2021   Current Outpatient Medications on File Prior to Visit  Medication Sig Dispense Refill   aspirin EC 81 MG tablet Take 1 tablet (81 mg total) by mouth daily. Swallow whole.     benzocaine (ANBESOL) 10 % mucosal gel Use as directed 1 application in the mouth or throat daily as needed for mouth pain. 5.3 g 0   Coenzyme Q10 (CO Q 10 PO) Take 1 capsule by mouth daily.     diclofenac sodium (VOLTAREN) 1 % GEL Apply 2 g topically 4 (four) times daily as needed. 100 g 2   EPINEPHrine 0.3 mg/0.3 mL IJ SOAJ injection  INJECT 0.3 MLS (1 PEN) INTO THE MUSCLE ONCE. AS NEEDED 2 Device 0   folic acid (FOLVITE) 1 MG tablet Take 1 mg by mouth daily.     KRILL OIL PO Take 1 tablet by mouth daily.     methotrexate (RHEUMATREX) 2.5 MG tablet Take 2 tablets (5 mg total) by mouth once a week. Caution:Chemotherapy. Protect from light.     Omega-3 Fatty Acids (FISH OIL PO) Take 1 tablet by mouth daily.     Red Yeast Rice Extract (RED YEAST RICE PO) Take 1 tablet by mouth daily.     SYNTHROID 50 MCG tablet TAKE 1 TABLET BY MOUTH  DAILY BEFORE BREAKFAST 90 tablet 3   calcium carbonate (TUMS - DOSED IN MG ELEMENTAL CALCIUM) 500 MG chewable tablet Chew 1 tablet by mouth daily as needed for indigestion or heartburn.     No current facility-administered medications on file prior to visit.     Allergies: Allergies  Allergen Reactions   Alpha-Gal Other (See Comments)   Sulfur Itching   Antihistamines, Diphenhydramine-Type     Urinary retention   Atorvastatin     REACTION: Intolerance   Beef-Derived Products     Tolerates chicken/fish/turkey   Ezetimibe     REACTION: Intolerance   Other     ALPHAGAL- see above re: beef allergy   Pork-Derived Products Other (See Comments)    Due to alpha gal hx   Simvastatin     REACTION: Intolerance   Sulfonamide Derivatives     REACTION: hives, itching   Tramadol Other (See Comments)    Lightheaded    Family History: Family History  Problem Relation Age of Onset   Stroke Mother    Diabetes Mother        borderline   Pneumonia Father    Hypertension Sister    Hyperlipidemia Sister    Alzheimer's disease Brother    Dementia Brother    Liver disease Sister        cirrhosis, nondrinker   Arthritis Other    Diabetes Other        1st degree relative   Hyperlipidemia Other    Hypertension Other    Prostate cancer Neg Hx    Colon cancer Neg Hx    Sister had severe migraines  Past Medical History: Past Medical History:  Diagnosis Date   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    alpha  gal reaction   Esophageal reflux    Family history of diabetes mellitus    History  of chickenpox    Hypertriglyceridemia    Other and unspecified hyperlipidemia    Pre-syncope    Thrombocytopenia, unspecified ([place])    Unspecified hypothyroidism     Past Surgical History Past Surgical History:  Procedure Laterality Date   APPENDECTOMY  1949    Social History: Social History   Tobacco Use   Smoking status: Never   Smokeless tobacco: Never   Tobacco comments:    smoked only as a teenage small amount  Vaping Use   Vaping Use: Never used  Substance Use Topics   Alcohol use: No   Drug use: No    ROS: Negative for fevers, chills. Positive for headaches, speech difficulty. All other systems reviewed and negative unless stated otherwise in HPI.   Physical Exam:   Vital Signs: BP 136/71   Pulse 63   Ht '5\' 4"'$  (1.626 m)   Wt 154 lb 8 oz (70.1 kg)   BMI 26.52 kg/m  GENERAL: well appearing,in no acute distress,alert SKIN:  Color, texture, turgor normal. No rashes or lesions HEAD:  Normocephalic/atraumatic. CV:  RRR RESP: Normal respiratory effort MSK: no tenderness to palpation over occiput, neck, or shoulders  NEUROLOGICAL: Mental Status: Alert, oriented to person, place and time,Follows commands Cranial Nerves: PERRL, visual fields intact to confrontation, extraocular movements intact, facial sensation intact, no facial droop or ptosis, hearing grossly intact, no dysarthria Motor: muscle strength 5/5 both upper and lower extremities,no drift, normal tone Reflexes: 2+ throughout Sensation: intact to light touch all 4 extremities Coordination: Finger-to- nose-finger intact bilaterally Gait: normal-based   IMPRESSION: 88 year old male with a history of ?TIA, GI bleed, hypothyroidism, HLD, alpha-gal allergy who presents for evaluation of an episode of speech difficulty associated with headache. MRI was negative for stroke. EEG did show left temporal slowing without  epileptiform discharges, though he did not exhibit any other seizure-like symptoms during the episode. Would consider repeat EEG if symptoms reoccur or other seizure-like symptoms develop. Otherwise, suspect his symptoms are secondary to migraine as he does report a prolonged headache which did not respond to medication during this episode. Would avoid triptans given history of ?TIAs. Will try Nurtec as needed if symptoms reoccur. 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 Will repeat CTA in one year to assess for stability.  PLAN: -Try Nurtec 75 mg as needed if headaches, speech changes reoccur -Repeat CTA head in one year -Consider repeat EEG if symptoms reoccur or new seizure-like symptoms develop  I spent a total of 48 minutes chart reviewing and counseling the patient. Headache education was done. Discussed treatment options including acute medications. Discussed medication side effects, adverse reactions and drug interactions. Written educational materials and patient instructions outlining all of the above were given.  Follow-up: 6 months   Genia Harold, MD 09/29/2021   2:04 PM

## 2021-09-29 ENCOUNTER — Encounter: Payer: Self-pay | Admitting: Psychiatry

## 2021-09-29 ENCOUNTER — Ambulatory Visit: Payer: Medicare Other | Admitting: Psychiatry

## 2021-09-29 VITALS — BP 136/71 | HR 63 | Ht 64.0 in | Wt 154.5 lb

## 2021-09-29 DIAGNOSIS — R9389 Abnormal findings on diagnostic imaging of other specified body structures: Secondary | ICD-10-CM | POA: Diagnosis not present

## 2021-09-29 DIAGNOSIS — R4701 Aphasia: Secondary | ICD-10-CM | POA: Diagnosis not present

## 2021-09-29 DIAGNOSIS — G43009 Migraine without aura, not intractable, without status migrainosus: Secondary | ICD-10-CM | POA: Diagnosis not present

## 2021-09-29 MED ORDER — NURTEC 75 MG PO TBDP: 75.0000 mg | ORAL_TABLET | ORAL | 6 refills | Status: DC | PRN

## 2021-09-29 NOTE — Patient Instructions (Addendum)
Repeat CTA scan in one year Take Nurtec as needed for bad headache or if speech symptoms return

## 2021-09-30 ENCOUNTER — Telehealth: Payer: Self-pay | Admitting: *Deleted

## 2021-09-30 ENCOUNTER — Telehealth: Payer: Self-pay | Admitting: Psychiatry

## 2021-09-30 NOTE — Telephone Encounter (Addendum)
NURTEC TAB '75MG'$  ODT, use as directed, is approved through 03/20/2022 under your Medicare Part D benefit. Approval letter faxed to pharmacy. Questions call [phone].

## 2021-09-30 NOTE — Telephone Encounter (Signed)
Nurtec PA, Key: [id number]. PA sent to plan.

## 2021-09-30 NOTE — Telephone Encounter (Signed)
FYI-Pt called wanting to inform the provider that when he got home and was talking to his wife about the appt, she reminded him that he actually did have a migraine 2 days before he went to the ER.

## 2021-11-22 ENCOUNTER — Other Ambulatory Visit: Payer: Self-pay | Admitting: Family Medicine

## 2022-01-04 ENCOUNTER — Ambulatory Visit: Payer: Medicare Other

## 2022-01-05 ENCOUNTER — Ambulatory Visit (INDEPENDENT_AMBULATORY_CARE_PROVIDER_SITE_OTHER): Payer: Medicare Other

## 2022-01-05 DIAGNOSIS — Z23 Encounter for immunization: Secondary | ICD-10-CM

## 2022-01-26 ENCOUNTER — Other Ambulatory Visit: Payer: Self-pay | Admitting: Family Medicine

## 2022-01-26 DIAGNOSIS — D696 Thrombocytopenia, unspecified: Secondary | ICD-10-CM

## 2022-01-26 DIAGNOSIS — E039 Hypothyroidism, unspecified: Secondary | ICD-10-CM

## 2022-01-26 DIAGNOSIS — E785 Hyperlipidemia, unspecified: Secondary | ICD-10-CM

## 2022-01-27 ENCOUNTER — Ambulatory Visit: Payer: Medicare Other | Admitting: Family Medicine

## 2022-01-27 ENCOUNTER — Encounter: Payer: Self-pay | Admitting: Family Medicine

## 2022-01-27 ENCOUNTER — Other Ambulatory Visit (INDEPENDENT_AMBULATORY_CARE_PROVIDER_SITE_OTHER): Payer: Medicare Other

## 2022-01-27 ENCOUNTER — Ambulatory Visit
Admission: RE | Admit: 2022-01-27 | Discharge: 2022-01-27 | Disposition: A | Discharge status: Home / Self Care | Payer: Medicare Other | Source: Ambulatory Visit | Attending: Family Medicine | Admitting: Family Medicine

## 2022-01-27 VITALS — BP 120/70 | HR 70 | Temp 98.3°F | Ht 64.0 in | Wt 153.0 lb

## 2022-01-27 DIAGNOSIS — D696 Thrombocytopenia, unspecified: Secondary | ICD-10-CM

## 2022-01-27 DIAGNOSIS — E785 Hyperlipidemia, unspecified: Secondary | ICD-10-CM | POA: Diagnosis not present

## 2022-01-27 DIAGNOSIS — E039 Hypothyroidism, unspecified: Secondary | ICD-10-CM

## 2022-01-27 DIAGNOSIS — M7989 Other specified soft tissue disorders: Secondary | ICD-10-CM

## 2022-01-27 LAB — COMPREHENSIVE METABOLIC PANEL
ALT: 16 U/L (ref 0–53)
AST: 17 U/L (ref 0–37)
Albumin: 4.4 g/dL (ref 3.5–5.2)
Alkaline Phosphatase: 59 U/L (ref 39–117)
BUN: 11 mg/dL (ref 6–23)
CO2: 30 mEq/L (ref 19–32)
Calcium: 8.7 mg/dL (ref 8.4–10.5)
Chloride: 100 mEq/L (ref 96–112)
Creatinine, Ser: 0.76 mg/dL (ref 0.40–1.50)
GFR: 79.17 mL/min (ref >60.00)
Glucose, Bld: 100 mg/dL — ABNORMAL HIGH (ref 70–99)
Potassium: 4.5 mEq/L (ref 3.5–5.1)
Sodium: 135 mEq/L (ref 135–145)
Total Bilirubin: 0.7 mg/dL (ref 0.2–1.2)
Total Protein: 6.5 g/dL (ref 6.0–8.3)

## 2022-01-27 LAB — CBC WITH DIFFERENTIAL/PLATELET
Basophils Absolute: 0 10*3/uL (ref 0.0–0.1)
Basophils Relative: 1 % (ref 0.0–3.0)
Eosinophils Absolute: 0.1 10*3/uL (ref 0.0–0.7)
Eosinophils Relative: 2.6 % (ref 0.0–5.0)
HCT: 46.8 % (ref 39.0–52.0)
Hemoglobin: 15.6 g/dL (ref 13.0–17.0)
Lymphocytes Relative: 25.6 % (ref 12.0–46.0)
Lymphs Abs: 1 10*3/uL (ref 0.7–4.0)
MCHC: 33.3 g/dL (ref 30.0–36.0)
MCV: 97.7 fl (ref 78.0–100.0)
Monocytes Absolute: 0.3 10*3/uL (ref 0.1–1.0)
Monocytes Relative: 6.7 % (ref 3.0–12.0)
Neutro Abs: 2.6 10*3/uL (ref 1.4–7.7)
Neutrophils Relative %: 64.1 % (ref 43.0–77.0)
Platelets: 130 10*3/uL — ABNORMAL LOW (ref 150.0–400.0)
RBC: 4.79 Mil/uL (ref 4.22–5.81)
RDW: 14.2 % (ref 11.5–15.5)
WBC: 4 10*3/uL (ref 4.0–10.5)

## 2022-01-27 LAB — LIPID PANEL
Cholesterol: 214 mg/dL — ABNORMAL HIGH (ref 0–200)
HDL: 45.1 mg/dL (ref >39.00)
NonHDL: 169.28
Total CHOL/HDL Ratio: 5
Triglycerides: 286 mg/dL — ABNORMAL HIGH (ref 0.0–149.0)
VLDL: 57.2 mg/dL — ABNORMAL HIGH (ref 0.0–40.0)

## 2022-01-27 LAB — TSH: TSH: 2.9 u[IU]/mL (ref 0.35–5.50)

## 2022-01-27 LAB — LDL CHOLESTEROL, DIRECT: Direct LDL: 119 mg/dL

## 2022-01-27 NOTE — Progress Notes (Signed)
Calf swelling intermittently noted for years.  Some better today.  Noted more when he is upright and working more, more in the last week. No sx on the L.  R calf isn't ttp.  No CP.  Not SOB.  No h/o PE or DVT.    Meds, vitals, and allergies reviewed.   ROS: Per HPI unless specifically indicated in ROS section   Nad Ncat Neck supple, no LA Rrr Ctab Abdomen soft. R calf 36 cm L calf 34 cm Skin well perfused.

## 2022-01-27 NOTE — Patient Instructions (Signed)
We are seeing up the ultrasound.  We'll go from there.  Take care.  Glad to see you.

## 2022-01-28 ENCOUNTER — Telehealth: Payer: Self-pay | Admitting: Family Medicine

## 2022-01-28 NOTE — Telephone Encounter (Signed)
Please call pt.  Great news.  Neg u/s.  Would try elevation and an OTC compression stocking.  Labs are stable, will d/w pt at [facility] next week.  Thanks.

## 2022-01-28 NOTE — Telephone Encounter (Signed)
Patient notified of lab results and verbalized understanding.  ° °

## 2022-01-28 NOTE — Telephone Encounter (Signed)
Unable to reach patient. Left voicemail to return call to our office.   

## 2022-01-28 NOTE — Telephone Encounter (Signed)
Patient called in to follow up on his [place] he had done yesterday. Please advise. Thank you!

## 2022-01-30 NOTE — Assessment & Plan Note (Signed)
Negative ultrasound, done as a stat.  Reasonable to elevate and consider using compression stockings.

## 2022-01-31 ENCOUNTER — Telehealth: Payer: Self-pay | Admitting: Family Medicine

## 2022-01-31 DIAGNOSIS — R519 Headache, unspecified: Secondary | ICD-10-CM

## 2022-01-31 NOTE — Telephone Encounter (Signed)
Pt called asking could another referral be sent elsewhere than [facility] @ [facility]? Call back # [phone]

## 2022-02-02 ENCOUNTER — Ambulatory Visit (INDEPENDENT_AMBULATORY_CARE_PROVIDER_SITE_OTHER): Payer: Medicare Other

## 2022-02-02 VITALS — Ht 64.0 in | Wt 153.0 lb

## 2022-02-02 DIAGNOSIS — Z0000 Encounter for general adult medical examination without abnormal findings: Secondary | ICD-10-CM

## 2022-02-02 NOTE — Telephone Encounter (Signed)
I put in referral for second opinion with [facility].  Thanks.

## 2022-02-02 NOTE — Patient Instructions (Signed)
Larry [name] , Thank you for taking time to come for your Medicare Wellness Visit. I appreciate your ongoing commitment to your health goals. Please review the following plan we discussed and let me know if I can assist you in the future.   Screening recommendations/referrals: Colonoscopy: AGED OUT Recommended yearly ophthalmology/optometry visit for glaucoma screening and checkup Recommended yearly dental visit for hygiene and checkup  Vaccinations: Influenza vaccine: 01/05/22 Pneumococcal vaccine: 10/23/14 Tdap vaccine: 11/19/13 Shingles vaccine: ZOSTAVAX  03/22/07   Covid-19: 04/09/19, 05/04/19, 01/23/20  Advanced directives: NO  Conditions/risks identified: NONE  Next appointment: Follow up in one year for your annual wellness visit. 02/06/23 @ 9:30 AM BY PHONE  Preventive Care 86 Years and Older, Male Preventive care refers to lifestyle choices and visits with your health care provider that can promote health and wellness. What does preventive care include? A yearly physical exam. This is also called an annual well check. Dental exams once or twice a year. Routine eye exams. Ask your health care provider how often you should have your eyes checked. Personal lifestyle choices, including: Daily care of your teeth and gums. Regular physical activity. Eating a healthy diet. Avoiding tobacco and drug use. Limiting alcohol use. Practicing safe sex. Taking low doses of aspirin every day. Taking vitamin and mineral supplements as recommended by your health care provider. What happens during an annual well check? The services and screenings done by your health care provider during your annual well check will depend on your age, overall health, lifestyle risk factors, and family history of disease. Counseling  Your health care provider may ask you questions about your: Alcohol use. Tobacco use. Drug use. Emotional well-being. Home and relationship well-being. Sexual activity. Eating  habits. History of falls. Memory and ability to understand (cognition). Work and work Statistician. Screening  You may have the following tests or measurements: Height, weight, and BMI. Blood pressure. Lipid and cholesterol levels. These may be checked every 5 years, or more frequently if you are over 86 years old. Skin check. Lung cancer screening. You may have this screening every year starting at age 86 if you have a 30-pack-year history of smoking and currently smoke or have quit within the past 15 years. Fecal occult blood test (FOBT) of the stool. You may have this test every year starting at age 86. Flexible sigmoidoscopy or colonoscopy. You may have a sigmoidoscopy every 5 years or a colonoscopy every 10 years starting at age 86. Prostate cancer screening. Recommendations will vary depending on your family history and other risks. Hepatitis C blood test. Hepatitis B blood test. Sexually transmitted disease (STD) testing. Diabetes screening. This is done by checking your blood sugar (glucose) after you have not eaten for a while (fasting). You may have this done every 1-3 years. Abdominal aortic aneurysm (AAA) screening. You may need this if you are a current or former smoker. Osteoporosis. You may be screened starting at age 86 if you are at high risk. Talk with your health care provider about your test results, treatment options, and if necessary, the need for more tests. Vaccines  Your health care provider may recommend certain vaccines, such as: Influenza vaccine. This is recommended every year. Tetanus, diphtheria, and acellular pertussis (Tdap, Td) vaccine. You may need a Td booster every 10 years. Zoster vaccine. You may need this after age 86. Pneumococcal 13-valent conjugate (PCV13) vaccine. One dose is recommended after age 86. Pneumococcal polysaccharide (PPSV23) vaccine. One dose is recommended after age 86. Talk to  your health care provider about which screenings and  vaccines you need and how often you need them. This information is not intended to replace advice given to you by your health care provider. Make sure you discuss any questions you have with your health care provider. Document Released: 04/03/2015 Document Revised: 11/25/2015 Document Reviewed: 01/06/2015 Elsevier Interactive Patient Education  2017 [place] Prevention in the Home Falls can cause injuries. They can happen to people of all ages. There are many things you can do to make your home safe and to help prevent falls. What can I do on the outside of my home? Regularly fix the edges of walkways and driveways and fix any cracks. Remove anything that might make you trip as you walk through a door, such as a raised step or threshold. Trim any bushes or trees on the path to your home. Use bright outdoor lighting. Clear any walking paths of anything that might make someone trip, such as rocks or tools. Regularly check to see if handrails are loose or broken. Make sure that both sides of any steps have handrails. Any raised decks and porches should have guardrails on the edges. Have any leaves, snow, or ice cleared regularly. Use sand or salt on walking paths during winter. Clean up any spills in your garage right away. This includes oil or grease spills. What can I do in the bathroom? Use night lights. Install grab bars by the toilet and in the tub and shower. Do not use towel bars as grab bars. Use non-skid mats or decals in the tub or shower. If you need to sit down in the shower, use a plastic, non-slip stool. Keep the floor dry. Clean up any water that spills on the floor as soon as it happens. Remove soap buildup in the tub or shower regularly. Attach bath mats securely with double-sided non-slip rug tape. Do not have throw rugs and other things on the floor that can make you trip. What can I do in the bedroom? Use night lights. Make sure that you have a light by your  bed that is easy to reach. Do not use any sheets or blankets that are too big for your bed. They should not hang down onto the floor. Have a firm chair that has side arms. You can use this for support while you get dressed. Do not have throw rugs and other things on the floor that can make you trip. What can I do in the kitchen? Clean up any spills right away. Avoid walking on wet floors. Keep items that you use a lot in easy-to-reach places. If you need to reach something above you, use a strong step stool that has a grab bar. Keep electrical cords out of the way. Do not use floor polish or wax that makes floors slippery. If you must use wax, use non-skid floor wax. Do not have throw rugs and other things on the floor that can make you trip. What can I do with my stairs? Do not leave any items on the stairs. Make sure that there are handrails on both sides of the stairs and use them. Fix handrails that are broken or loose. Make sure that handrails are as long as the stairways. Check any carpeting to make sure that it is firmly attached to the stairs. Fix any carpet that is loose or worn. Avoid having throw rugs at the top or bottom of the stairs. If you do have throw rugs, attach  them to the floor with carpet tape. Make sure that you have a light switch at the top of the stairs and the bottom of the stairs. If you do not have them, ask someone to add them for you. What else can I do to help prevent falls? Wear shoes that: Do not have high heels. Have rubber bottoms. Are comfortable and fit you well. Are closed at the toe. Do not wear sandals. If you use a stepladder: Make sure that it is fully opened. Do not climb a closed stepladder. Make sure that both sides of the stepladder are locked into place. Ask someone to hold it for you, if possible. Clearly mark and make sure that you can see: Any grab bars or handrails. First and last steps. Where the edge of each step is. Use tools that  help you move around (mobility aids) if they are needed. These include: Canes. Walkers. Scooters. Crutches. Turn on the lights when you go into a dark area. Replace any light bulbs as soon as they burn out. Set up your furniture so you have a clear path. Avoid moving your furniture around. If any of your floors are uneven, fix them. If there are any pets around you, be aware of where they are. Review your medicines with your doctor. Some medicines can make you feel dizzy. This can increase your chance of falling. Ask your doctor what other things that you can do to help prevent falls. This information is not intended to replace advice given to you by your health care provider. Make sure you discuss any questions you have with your health care provider. Document Released: 01/01/2009 Document Revised: 08/13/2015 Document Reviewed: 04/11/2014 Elsevier Interactive Patient Education  2017 [employer].

## 2022-02-02 NOTE — Progress Notes (Signed)
Virtual Visit via Telephone Note  I connected with  Larry [name] on 02/02/22 at  9:30 AM EST by telephone and verified that I am speaking with the correct person using two identifiers.  Location: Patient: HOME Provider: [place] participating in the virtual visit: [place]   I discussed the limitations, risks, security and privacy concerns of performing an evaluation and management service by telephone and the availability of in person appointments. The patient expressed understanding and agreed to proceed.  Interactive audio and video telecommunications were attempted between this nurse and patient, however failed, due to patient having technical difficulties OR patient did not have access to video capability.  We continued and completed visit with audio only.  Some vital signs may be absent or patient reported.   Dionisio David, LPN  Subjective:   Larry [name] is a 88 y.o. male who presents for Medicare Annual/Subsequent preventive examination.  Review of Systems     Cardiac Risk Factors include: advanced age (>13mn, >>73women);male gender     Objective:    There were no vitals filed for this visit. There is no height or weight on file to calculate BMI.     02/02/2022    9:40 AM 08/27/2021    1:36 AM 08/26/2021    2:04 PM 02/24/2020    4:59 PM 12/27/2019    1:15 PM 05/10/2019    9:00 AM 05/09/2019   12:40 PM  Advanced Directives  Does Patient Have a Medical Advance Directive? No  No No;Yes Yes No No  Type of AProduction managerof [place] will    Copy of [place] Chart?     No - copy requested    Would patient like information on creating a medical advance directive? No - Patient declined Yes (Inpatient - patient requests chaplain consult to create a medical advance directive)    No - Patient declined No - Patient declined    Current Medications (verified) Outpatient Encounter  Medications as of 02/02/2022  Medication Sig   aspirin EC 81 MG tablet Take 1 tablet (81 mg total) by mouth daily. Swallow whole.   benzocaine (ANBESOL) 10 % mucosal gel Use as directed 1 application in the mouth or throat daily as needed for mouth pain.   calcium carbonate (TUMS - DOSED IN MG ELEMENTAL CALCIUM) 500 MG chewable tablet Chew 1 tablet by mouth daily as needed for indigestion or heartburn.   Coenzyme Q10 (CO Q 10 PO) Take 1 capsule by mouth daily.   diclofenac sodium (VOLTAREN) 1 % GEL Apply 2 g topically 4 (four) times daily as needed.   EPINEPHrine 0.3 mg/0.3 mL IJ SOAJ injection INJECT 0.3 MLS (1 PEN) INTO THE MUSCLE ONCE. AS NEEDED   folic acid (FOLVITE) 1 MG tablet Take 1 mg by mouth daily.   KRILL OIL PO Take 1 tablet by mouth daily.   methotrexate (RHEUMATREX) 2.5 MG tablet Take 2 tablets (5 mg total) by mouth once a week. Caution:Chemotherapy. Protect from light.   Omega-3 Fatty Acids (FISH OIL PO) Take 1 tablet by mouth daily.   Red Yeast Rice Extract (RED YEAST RICE PO) Take 1 tablet by mouth daily.   SYNTHROID 50 MCG tablet TAKE 1 TABLET BY MOUTH  DAILY BEFORE BREAKFAST   [DISCONTINUED] Rimegepant Sulfate (NURTEC) 75 MG TBDP Take 75 mg by mouth as needed (for migraine). Max dose 1 pill in 24 hours (Patient not taking: Reported on  02/02/2022)   No facility-administered encounter medications on file as of 02/02/2022.    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   Esophageal reflux    Family history of diabetes mellitus    History of chickenpox    Hypertriglyceridemia    Other and unspecified hyperlipidemia    Pre-syncope    Thrombocytopenia, unspecified ([place])    Unspecified hypothyroidism    Past Surgical  History:  Procedure Laterality Date   APPENDECTOMY  1949   Family History  Problem Relation Age of Onset   Stroke Mother    Diabetes Mother        borderline   Pneumonia Father    Hypertension Sister    Hyperlipidemia Sister    Alzheimer's disease Brother    Dementia Brother    Liver disease Sister        cirrhosis, nondrinker   Arthritis Other    Diabetes Other        1st degree relative   Hyperlipidemia Other    Hypertension Other    Prostate cancer Neg Hx    Colon cancer Neg Hx    Social History   Socioeconomic History   Marital status: Married    Spouse name: Not on file   Number of children: 4   Years of education: Not on file   Highest education level: Not on file  Occupational History   Occupation: Retired from [employer]    Employer: RETIRED  Tobacco Use   Smoking status: Never   Smokeless tobacco: Never   Tobacco comments:    smoked only as a teenage small amount  Vaping Use   Vaping Use: Never used  Substance and Sexual Activity   Alcohol use: No   Drug use: No   Sexual activity: Not on file  Other Topics Concern   Not on file  Social History Narrative   Caffeine:  Coffee, tea, 2+ daily.   Diet:  Fish, chicken, vegetables   Regular exercise:  Yes, yard work, gardening   Married 1953   4 kids   Right handed   Social Determinants of Health   Financial Resource Strain: Low Risk  (02/02/2022)   Overall Financial Resource Strain (CARDIA)    Difficulty of Paying Living Expenses: Not hard at all  Food Insecurity: No Food Insecurity (02/02/2022)   Hunger Vital Sign    Worried About Running Out of Food in the Last Year: Never true    Ran Out of Food in the Last Year: Never true  Transportation Needs: No Transportation Needs (02/02/2022)   PRAPARE - Hydrologist (Medical): No    Lack of Transportation (Non-Medical): No  Physical Activity: Sufficiently Active (02/02/2022)   Exercise Vital Sign    Days of Exercise per  Week: 5 days    Minutes of Exercise per Session: 30 min  Stress: No Stress Concern Present (02/02/2022)   [place]    Feeling of Stress : Only a little  Social Connections: Moderately Integrated (02/02/2022)   Social Connection and Isolation Panel [NHANES]    Frequency of Communication with Friends and Family: More than three times a week    Frequency of Social Gatherings with Friends and Family: Three times a week    Attends Religious  Services: More than 4 times per year    Active Member of Clubs or Organizations: No    Attends Archivist Meetings: Never    Marital Status: Married    Tobacco Counseling Counseling given: Not Answered Tobacco comments: smoked only as a teenage small amount   Clinical Intake:  Pre-visit preparation completed: Yes  Pain : No/denies pain     Nutritional Risks: None Diabetes: No  How often do you need to have someone help you when you read instructions, pamphlets, or other written materials from your doctor or pharmacy?: 1 - Never  Diabetic?NO  Interpreter Needed?: No  Information entered by :: Kirke Shaggy,  LPN   Activities of Daily Living    02/02/2022    9:43 AM 08/27/2021   12:00 PM  In your present state of health, do you have any difficulty performing the following activities:  Hearing? 1   Vision? 0   Difficulty concentrating or making decisions? 0   Walking or climbing stairs? 0   Dressing or bathing? 0   Doing errands, shopping? 0 1  Preparing Food and eating ? N   Using the Toilet? N   In the past six months, have you accidently leaked urine? N   Do you have problems with loss of bowel control? N   Managing your Medications? N   Managing your Finances? N   Housekeeping or managing your Housekeeping? N     Patient Care Team: Tonia Ghent, MD as PCP - Sandford Craze, MD as Consulting Physician (Dermatology) Mosetta Anis, MD as  Referring Physician (Allergy)  Indicate any recent Medical Services you may have received from other than [facility] providers in the past year (date may be approximate).     Assessment:   This is a routine wellness examination for Larry [name].  Hearing/Vision screen Hearing Screening - Comments:: WEARS AIDS Vision Screening - Comments:: READERS- [facility]  Dietary issues and exercise activities discussed: Current Exercise Habits: Home exercise routine, Type of exercise: walking, Time (Minutes): 30, Frequency (Times/Week): 5, Weekly Exercise (Minutes/Week): 150, Intensity: Mild   Goals Addressed             This Visit's Progress    DIET - EAT MORE FRUITS AND VEGETABLES         Depression Screen    02/02/2022    9:35 AM 08/30/2021   10:10 AM 12/27/2019    1:17 PM 12/20/2018   10:15 AM 12/14/2017    8:14 AM 12/05/2016   10:52 AM 11/29/2016   11:26 AM  PHQ 2/9 Scores  PHQ - 2 Score 0 0 0 0 0 0 0  PHQ- 9 Score 0 0 0  0  0    Fall Risk    02/02/2022    9:43 AM 12/27/2019    1:17 PM 12/20/2018   10:15 AM 12/14/2017    8:14 AM 12/05/2016   10:52 AM  Fall Risk   Falls in the past year? 0 0 0 No No  Number falls in past yr: 0 0     Injury with Fall? 0 0     Risk for fall due to : No Fall Risks No Fall Risks     Follow up Falls prevention discussed;Falls evaluation completed Falls evaluation completed;Falls prevention discussed       FALL RISK PREVENTION PERTAINING TO THE HOME:  Any stairs in or around the home? Yes  If so, are there any without handrails? No  Home free of  loose throw rugs in walkways, pet beds, electrical cords, etc? Yes  Adequate lighting in your home to reduce risk of falls? Yes   ASSISTIVE DEVICES UTILIZED TO PREVENT FALLS:  Life alert? No  Use of a cane, walker or w/c? No  Grab bars in the bathroom? Yes  Shower chair or bench in shower? No  Elevated toilet seat or a handicapped toilet? Yes    Cognitive Function:    12/27/2019    1:21 PM 12/14/2017     8:15 AM 11/29/2016   11:30 AM  MMSE - Mini Mental State Exam  Orientation to time '5 5 5  '$ Orientation to Place '5 5 5  '$ Registration '3 3 3  '$ Attention/ Calculation 5 0 0  Recall '3 3 3  '$ Language- name 2 objects  0 0  Language- repeat '1 1 1  '$ Language- follow 3 step command  3 3  Language- read & follow direction  0 0  Write a sentence  0 0  Copy design  0 0  Total score  20 20        02/02/2022    9:44 AM  6CIT Screen  What Year? 0 points  What month? 0 points  What time? 0 points  Count back from 20 0 points  Months in reverse 0 points  Repeat phrase 0 points  Total Score 0 points    Immunizations Immunization History  Administered Date(s) Administered   Fluad Quad(high Dose 65+) 12/20/2018, 01/01/2020, 01/05/2022   Influenza Split 12/30/2010, 12/02/2011   Influenza Whole 01/13/2010   Influenza,inj,Quad PF,6+ Mos 01/08/2013, 01/16/2014, 12/26/2014, 11/27/2015, 01/04/2017, 01/11/2018, 01/02/2021   PFIZER(Purple Top)SARS-COV-2 Vaccination 04/09/2019, 05/04/2019, 01/23/2020   Pneumococcal Conjugate-13 10/23/2014   Pneumococcal Polysaccharide-23 10/18/2001   Td 09/19/2002, 11/19/2013   Zoster, Live 03/22/2007    TDAP status: Up to date  Flu Vaccine status: Up to date  Pneumococcal vaccine status: Up to date  Covid-19 vaccine status: Completed vaccines  Qualifies for Shingles Vaccine? Yes   Zostavax completed Yes   Shingrix Completed?: No.    Education has been provided regarding the importance of this vaccine. Patient has been advised to call insurance company to determine out of pocket expense if they have not yet received this vaccine. Advised may also receive vaccine at local pharmacy or Health Dept. Verbalized acceptance and understanding.  Screening Tests Health Maintenance  Topic Date Due   Zoster Vaccines- Shingrix (1 of 2) Never done   COVID-19 Vaccine (4 - Pfizer series) 03/19/2020   Medicare Annual Wellness (AWV)  02/03/2023   TETANUS/TDAP  11/20/2023    Pneumonia Vaccine 15+ Years old  Completed   INFLUENZA VACCINE  Completed   HPV VACCINES  Aged Out    Health Maintenance  Health Maintenance Due  Topic Date Due   Zoster Vaccines- Shingrix (1 of 2) Never done   COVID-19 Vaccine (4 - Pfizer series) 03/19/2020    Colorectal cancer screening: No longer required.   Lung Cancer Screening: (Low Dose CT Chest recommended if Age 85-80 years, 30 pack-year currently smoking OR have quit w/in 15years.) does not qualify.    Additional Screening:  Hepatitis C Screening: does not qualify; Completed NO  Vision Screening: Recommended annual ophthalmology exams for early detection of glaucoma and other disorders of the eye. Is the patient up to date with their annual eye exam?  Yes  Who is the provider or what is the name of the office in which the patient attends annual eye exams? [facility] If  pt is not established with a provider, would they like to be referred to a provider to establish care? No .   Dental Screening: Recommended annual dental exams for proper oral hygiene  Community Resource Referral / Chronic Care Management: CRR required this visit?  No   CCM required this visit?  No      Plan:     I have personally reviewed and noted the following in the patient's chart:   Medical and social history Use of alcohol, tobacco or illicit drugs  Current medications and supplements including opioid prescriptions. Patient is not currently taking opioid prescriptions. Functional ability and status Nutritional status Physical activity Advanced directives List of other physicians Hospitalizations, surgeries, and ER visits in previous 12 months Vitals Screenings to include cognitive, depression, and falls Referrals and appointments  In addition, I have reviewed and discussed with patient certain preventive protocols, quality metrics, and best practice recommendations. A written personalized care plan for preventive services as well  as general preventive health recommendations were provided to patient.     Dionisio David, LPN   [id number]   Nurse Notes: Marlynn Perking

## 2022-02-02 NOTE — Addendum Note (Signed)
Addended by: Tonia Ghent on: 02/02/2022 06:14 PM   Modules accepted: Orders

## 2022-02-03 ENCOUNTER — Telehealth: Payer: Self-pay | Admitting: Family Medicine

## 2022-02-03 ENCOUNTER — Encounter: Payer: Self-pay | Admitting: Family Medicine

## 2022-02-03 ENCOUNTER — Ambulatory Visit (INDEPENDENT_AMBULATORY_CARE_PROVIDER_SITE_OTHER): Payer: Medicare Other | Admitting: Family Medicine

## 2022-02-03 VITALS — BP 120/74 | HR 78 | Temp 97.9°F | Ht 64.0 in | Wt 153.0 lb

## 2022-02-03 DIAGNOSIS — R2990 Unspecified symptoms and signs involving the nervous system: Secondary | ICD-10-CM | POA: Diagnosis not present

## 2022-02-03 DIAGNOSIS — M7989 Other specified soft tissue disorders: Secondary | ICD-10-CM

## 2022-02-03 DIAGNOSIS — Z7189 Other specified counseling: Secondary | ICD-10-CM

## 2022-02-03 DIAGNOSIS — E785 Hyperlipidemia, unspecified: Secondary | ICD-10-CM | POA: Diagnosis not present

## 2022-02-03 DIAGNOSIS — E039 Hypothyroidism, unspecified: Secondary | ICD-10-CM

## 2022-02-03 DIAGNOSIS — D696 Thrombocytopenia, unspecified: Secondary | ICD-10-CM

## 2022-02-03 DIAGNOSIS — Z0000 Encounter for general adult medical examination without abnormal findings: Secondary | ICD-10-CM

## 2022-02-03 NOTE — Telephone Encounter (Signed)
Patient notified this am at [place]

## 2022-02-03 NOTE — Telephone Encounter (Signed)
Patient called and stated Dr. Damita Dunnings did send him to the right doctor about his headaches. Requested a call back at [phone].

## 2022-02-03 NOTE — Telephone Encounter (Signed)
Patient came back up to the office and I spoke with him. He states he did have the two headaches and he saw neurology. Everything checked out fine and he does not need to go back. Patient states we can cancel the new referral that was done yesterday.

## 2022-02-03 NOTE — Telephone Encounter (Signed)
error 

## 2022-02-03 NOTE — Patient Instructions (Signed)
I will work on cancelling the neurology appointment.   You should get a call about seeing vascular about your leg.  Don't change your meds and keep using the stocking.  Take care.  Glad to see you.

## 2022-02-03 NOTE — Progress Notes (Signed)
D/w pt about neuro eval.  He wanted to defer that for now. I told him I would cancel that referral.  We can refer to [facility] later on if needed, per patient request.  No other events in the meantime like prev ER presentation.    Hypothyroidism.  TSH wnl.  No neck mass. No dysphagia.  Compliant.   HLD.  D/w pt about RYR.  Able to tolerate that.    R leg swelling w/o DVT.  No FCNAVD.  Locally puffy in the R leg but no SOB or CP.  He is elevating and using a compression stocking in the meantime.    On MTX per outside clinic.   Low platelets at baseline.  No bleeding.  Discussed with patient.  He had his 72th anniversary this year.  The local news channel filmed their anniversary reception for broadcast.  He and his wife have a remarkable accomplishment with their 72th anniversary.  Tetanus 2015 PNA UTD Shingles 2009 Flu shot 2023 covid vaccine 2021 PSA and colon cancer screening deferred given age.   Advance directive-wife designated if patient were incapacitated.  Meds, vitals, and allergies reviewed.   ROS: Per HPI unless specifically indicated in ROS section   GEN: nad, alert and oriented HEENT: ncat NECK: supple w/o LA CV: rrr.  PULM: ctab, no inc wob ABD: soft, +bs EXT: See below. SKIN: no acute rash R calf 36 cm L calf 34 cm 1+ R lower leg edema.

## 2022-02-06 NOTE — Assessment & Plan Note (Signed)
Recent labs discussed with patient.  No bleeding.  Would not intervene at this point.

## 2022-02-06 NOTE — Assessment & Plan Note (Signed)
Negative for DVT.  Would use a compression stocking in the meantime and refer to vascular surgery to see if there is any intervention at this point that would be appropriate.

## 2022-02-06 NOTE — Assessment & Plan Note (Signed)
Continue levothyroxine.  TSH wnl.  No neck mass. No dysphagia.  Compliant.

## 2022-02-06 NOTE — Telephone Encounter (Signed)
I need your help with the orders here.  He has 2 active referrals, 1 to vascular surgery about his leg swelling and the other is to neurology.  We talked about canceling the neurology referral.  I am unable to cancel that in the EMR right now.  Please check with the referral department about canceling that.  Please make sure he is still willing to go through with the vascular referral, which is a separate issue, about his leg.  Thanks.

## 2022-02-06 NOTE — Assessment & Plan Note (Signed)
Tetanus 2015 PNA UTD Shingles 2009 Flu shot 2023 covid vaccine 2021 PSA and colon cancer screening deferred given age.   Advance directive-wife designated if patient were incapacitated.

## 2022-02-06 NOTE — Assessment & Plan Note (Signed)
Advance directive- wife designated if patient were incapacitated.  

## 2022-02-06 NOTE — Assessment & Plan Note (Signed)
D/w pt about neuro eval.  He wanted to defer that for now. I told him I would cancel that referral.  We can refer to [facility] later on if needed, per patient request.  No other events in the meantime like prev ER presentation.  He will update me as needed.

## 2022-02-06 NOTE — Assessment & Plan Note (Signed)
Continue red yeast rice.  Continue work on diet and exercise.

## 2022-02-08 NOTE — Telephone Encounter (Signed)
Patient states he will still go to vascular when they call to schedule his appt.   Can someone please cancel the neurology referral?

## 2022-02-23 ENCOUNTER — Telehealth: Payer: Self-pay | Admitting: Family Medicine

## 2022-02-23 NOTE — Telephone Encounter (Signed)
Patient daughter Larry [name] called in and stated that Larry [name] and [name] needs home health aide to come into the home. She stated that they aren't able to keep up with medications and other household needs. [name] tries to keep this stuff up but they are having a hard time. She stated they both are so confused. She stated that Larry [name] doesn't want this but they need this. She would like for Dr. Damita Dunnings to give her a call to discuss more with her. Larry [name] can be reached at [phone]. Please advise. Thank you!

## 2022-02-23 NOTE — Telephone Encounter (Signed)
I put the same note in both charts (patient and spouse).  This is not going to get improved without a face-to-face visit dedicated to the issue.  Larry [name] has a follow-up appointment pending.  I think we should talk about it all at that point.  Thanks.

## 2022-02-24 NOTE — Telephone Encounter (Signed)
Spoke with Larry [name] and advised her that they both will need a visit if [facility] is for both of them or whichever needs it. She states they really need this as meds are getting missed and [facility] can not pick [name] up barely. They are having a really hard time since she is a fall risk; he is having to do everything and be with her 24/7 as he can not leave her. Daughter would really like to speak with Dr. Damita Dunnings about off of this if he would call her. Adding same note to wifes chart.

## 2022-02-24 NOTE — Telephone Encounter (Signed)
Spoke with Larry [name] and advised her that we can talk to her and patients tomorrow at [facility]. She agreed with that and is going to come with him and also try to get her mother to come as well. Advised we can do a visit with her as well if she can make that happen.

## 2022-02-24 NOTE — Telephone Encounter (Signed)
Called Angela back.  No answer. LMOVM.  No confidential info on the message. Asked her to please call back with specific concern or information that she has and give info to staff here, ie Jessica.  Mr. Winterhalter has OV tomorrow and we can discuss then.  Thanks.

## 2022-02-25 ENCOUNTER — Ambulatory Visit: Payer: Medicare Other | Admitting: Family Medicine

## 2022-02-28 ENCOUNTER — Ambulatory Visit: Payer: Medicare Other | Admitting: Family Medicine

## 2022-02-28 ENCOUNTER — Encounter: Payer: Self-pay | Admitting: Family Medicine

## 2022-02-28 VITALS — BP 122/70 | HR 77 | Temp 97.9°F | Ht 64.0 in | Wt 149.0 lb

## 2022-02-28 DIAGNOSIS — R609 Edema, unspecified: Secondary | ICD-10-CM

## 2022-03-02 DIAGNOSIS — R609 Edema, unspecified: Secondary | ICD-10-CM | POA: Insufficient documentation

## 2022-03-02 NOTE — Assessment & Plan Note (Signed)
Continue compression stockings as needed.  Update me as needed.

## 2022-03-02 NOTE — Progress Notes (Signed)
No charge for visit.  We talked about his recent situation.  He had improved in the meantime.  He was still having 1+ ankle swelling using compression stockings.  He was treated with molnupiravir for COVID.  He was not completely back to baseline but he was clearly feeling better in the meantime.   Noted previous ultrasound on the right leg without DVT.  Discussed.  Nad Ncat Rrr Ctab 1 + BLE edema No left ankle edema.

## 2022-04-20 ENCOUNTER — Ambulatory Visit: Payer: Medicare Other | Admitting: Psychiatry

## 2022-06-20 ENCOUNTER — Encounter (INDEPENDENT_AMBULATORY_CARE_PROVIDER_SITE_OTHER): Payer: Medicare Other | Admitting: Nurse Practitioner

## 2022-09-05 ENCOUNTER — Telehealth: Payer: Self-pay | Admitting: *Deleted

## 2022-09-05 NOTE — Telephone Encounter (Signed)
Dr. 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 Delena Bali 09/30/22 and she wanted to see him back in 6 months. Appears he cx 04/20/22 appt (note states wife called stating appt not needed).  Phone room: please call and let them know he is due for follow up for updated exam/discuss repeat imaging he is due for. Please schedule appt

## 2022-09-08 NOTE — Telephone Encounter (Signed)
Called pt and wife picked up. Stated pt doesn't want to make an appointment tat this time. Stated they will call if they feel he needs to come back.

## 2022-09-08 NOTE — Telephone Encounter (Signed)
Phone room: can you please try pt and/or family again? Thank you

## 2022-09-08 NOTE — Telephone Encounter (Signed)
Dr. Chima- FYI 

## 2022-10-13 ENCOUNTER — Encounter: Payer: Self-pay | Admitting: Internal Medicine

## 2022-10-13 ENCOUNTER — Ambulatory Visit: Payer: Medicare Other | Admitting: Internal Medicine

## 2022-10-13 VITALS — BP 120/78 | HR 68 | Temp 97.2°F | Ht 64.0 in | Wt 155.0 lb

## 2022-10-13 DIAGNOSIS — S41112A Laceration without foreign body of left upper arm, initial encounter: Secondary | ICD-10-CM

## 2022-10-13 DIAGNOSIS — Z23 Encounter for immunization: Secondary | ICD-10-CM | POA: Diagnosis not present

## 2022-10-13 MED ORDER — CEPHALEXIN 500 MG PO CAPS: 500.0000 mg | ORAL_CAPSULE | Freq: Three times a day (TID) | ORAL | 0 refills | Status: DC

## 2022-10-13 NOTE — Addendum Note (Signed)
Addended by: Eual Fines on: 10/13/2022 10:24 AM   Modules accepted: Orders

## 2022-10-13 NOTE — Assessment & Plan Note (Signed)
Skin flap that appears viable No clear infection---discussed continuing daily neosporin Rx cephalexin 500 tid x 5 days if worsens Td booster--from [employer] and 9 years

## 2022-10-13 NOTE — Progress Notes (Signed)
Subjective:    Patient ID: Larry [name], male    DOB: 12-03-1929, 89 y.o.   MRN: [id number]  HPI Here with a scratch that is not healing right--from dog (neighbor's puppy)  Happened 5-6 days ago [place] and he washed it well--and used alcohol Using neosporin No discharge Has been watching it---and still sore and not healing like he expected  Current Outpatient Medications on File Prior to Visit  Medication Sig Dispense Refill   aspirin EC 81 MG tablet Take 1 tablet (81 mg total) by mouth daily. Swallow whole.     benzocaine (ANBESOL) 10 % mucosal gel Use as directed 1 application in the mouth or throat daily as needed for mouth pain. 5.3 g 0   calcium carbonate (TUMS - DOSED IN MG ELEMENTAL CALCIUM) 500 MG chewable tablet Chew 1 tablet by mouth daily as needed for indigestion or heartburn.     Coenzyme Q10 (CO Q 10 PO) Take 1 capsule by mouth daily.     diclofenac sodium (VOLTAREN) 1 % GEL Apply 2 g topically 4 (four) times daily as needed. 100 g 2   EPINEPHrine 0.3 mg/0.3 mL IJ SOAJ injection INJECT 0.3 MLS (1 PEN) INTO THE MUSCLE ONCE. AS NEEDED 2 Device 0   folic acid (FOLVITE) 1 MG tablet Take 1 mg by mouth daily.     KRILL OIL PO Take 1 tablet by mouth daily.     methotrexate (RHEUMATREX) 2.5 MG tablet Take 2 tablets (5 mg total) by mouth once a week. Caution:Chemotherapy. Protect from light.     Omega-3 Fatty Acids (FISH OIL PO) Take 1 tablet by mouth daily.     Red Yeast Rice Extract (RED YEAST RICE PO) Take 1 tablet by mouth daily.     SYNTHROID 50 MCG tablet TAKE 1 TABLET BY MOUTH  DAILY BEFORE BREAKFAST 90 tablet 3   No current facility-administered medications on file prior to visit.    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   Esophageal reflux    Family history of diabetes mellitus    History of chickenpox    Hypertriglyceridemia    Other and unspecified hyperlipidemia    Pre-syncope    Thrombocytopenia, unspecified (HCC)    Unspecified hypothyroidism     Past Surgical History:  Procedure Laterality Date   APPENDECTOMY  1949    Family History  Problem Relation Age of Onset   Stroke Mother    Diabetes Mother        borderline   Pneumonia Father    Hypertension Sister    Hyperlipidemia Sister    Alzheimer's disease Brother    Dementia Brother    Liver disease Sister        cirrhosis, nondrinker   Arthritis Other    Diabetes  Other        1st degree relative   Hyperlipidemia Other    Hypertension Other    Prostate cancer Neg Hx    Colon cancer Neg Hx     Social History   Socioeconomic History   Marital status: Married    Spouse name: Not on file   Number of children: 4   Years of education: Not on file   Highest education level: Not on file  Occupational History   Occupation: Retired from [employer]    Employer: RETIRED  Tobacco Use   Smoking status: Never   Smokeless tobacco: Never   Tobacco comments:    smoked only as a teenage small amount  Vaping Use   Vaping status: Never Used  Substance and Sexual Activity   Alcohol use: No   Drug use: No   Sexual activity: Not on file  Other Topics Concern   Not on file  Social History Narrative   Caffeine:  Coffee, tea, 2+ daily.   Diet:  Fish, chicken, vegetables   Regular exercise:  Yes, yard work, gardening   Married 1953   4 kids   Right handed   Social Determinants of Health    Financial Resource Strain: Low Risk  (02/02/2022)   Overall Financial Resource Strain (CARDIA)    Difficulty of Paying Living Expenses: Not hard at all  Food Insecurity: No Food Insecurity (02/02/2022)   Hunger Vital Sign    Worried About Running Out of Food in the Last Year: Never true    Ran Out of Food in the Last Year: Never true  Transportation Needs: No Transportation Needs (02/02/2022)   PRAPARE - Administrator, Civil Service (Medical): No    Lack of Transportation (Non-Medical): No  Physical Activity: Sufficiently Active (02/02/2022)   Exercise Vital Sign    Days of Exercise per Week: 5 days    Minutes of Exercise per Session: 30 min  Stress: No Stress Concern Present (02/02/2022)   [employer] of Occupational Health - Occupational Stress Questionnaire    Feeling of Stress : Only a little  Social Connections: Moderately Integrated (02/02/2022)   Social Connection and Isolation Panel [NHANES]    Frequency of Communication with Friends and Family: More than three times a week    Frequency of Social Gatherings with Friends and Family: Three times a week    Attends Religious Services: More than 4 times per year    Active Member of Clubs or Organizations: No    Attends Banker Meetings: Never    Marital Status: Married  Catering manager Violence: Not At Risk (02/02/2022)   Humiliation, Afraid, Rape, and Kick questionnaire    Fear of Current or Ex-Partner: No    Emotionally Abused: No    Physically Abused: No    Sexually Abused: No   Review of Systems No fever     Objective:   Physical Exam Constitutional:      Appearance: Normal appearance.  Skin:    Comments: ~ 8 x 4 mm triangular skin flap on extensor forearm Flap looks viable Some granulation on edges Mild redness surrounding but not really warm or tender  Neurological:     Mental Status: He is alert.            Assessment & Plan:

## 2022-10-14 ENCOUNTER — Telehealth: Payer: Self-pay

## 2022-10-14 NOTE — Telephone Encounter (Signed)
If feeling well, would skip one dose to compensate.  Thanks.

## 2022-10-14 NOTE — Telephone Encounter (Signed)
That would also be okay.  He should be fine and this is a low risk event.  Thanks.

## 2022-10-14 NOTE — Telephone Encounter (Signed)
Sending note to DR Para March and [employer] pool.

## 2022-10-14 NOTE — Telephone Encounter (Signed)
Spoke with patient and advised patient what Dr. Para March recommended. Patient stated that he was told by the doctor and nurse on call last night that it was okay and to continue on as normal; that he could take his normal dose today which patient did already this morning. He states he feels fine.

## 2022-10-17 ENCOUNTER — Other Ambulatory Visit: Payer: Self-pay | Admitting: Family Medicine

## 2022-12-06 ENCOUNTER — Other Ambulatory Visit: Payer: Self-pay | Admitting: Family Medicine

## 2022-12-07 ENCOUNTER — Telehealth: Payer: Self-pay | Admitting: Family Medicine

## 2022-12-07 NOTE — Telephone Encounter (Signed)
LOV - 10/13/22 with Dr. Alphonsus Sias NOV - not scheduled RF- not on patients medication list

## 2022-12-07 NOTE — Telephone Encounter (Signed)
Prescription Request  12/07/2022  LOV: 02/28/2022  What is the name of the medication or equipment? Alazopram 0.25mg   Have you contacted your pharmacy to request a refill? No   Which pharmacy would you like this sent to?  [id number], [place] - [address] [place] [address] Phone: [phone] Fax: [phone]     Patient notified that their request is being sent to the clinical staff for review and that they should receive a response within 2 business days.   Please advise at Mobile [phone] (mobile)   Pt states he was prescribed the meds 02/01/21.

## 2022-12-07 NOTE — Telephone Encounter (Signed)
TE has already been sent. Medication not on medication list.

## 2022-12-07 NOTE — Telephone Encounter (Signed)
See refill note.

## 2022-12-07 NOTE — Telephone Encounter (Signed)
H/o rare use.  Rx sent.

## 2023-01-02 ENCOUNTER — Encounter: Payer: Self-pay | Admitting: Family Medicine

## 2023-01-03 ENCOUNTER — Ambulatory Visit: Payer: Medicare Other | Admitting: Family Medicine

## 2023-01-03 ENCOUNTER — Encounter: Payer: Self-pay | Admitting: Family Medicine

## 2023-01-03 VITALS — BP 136/80 | HR 68 | Temp 98.3°F | Ht 64.0 in | Wt 154.0 lb

## 2023-01-03 DIAGNOSIS — F419 Anxiety disorder, unspecified: Secondary | ICD-10-CM

## 2023-01-03 DIAGNOSIS — Z659 Problem related to unspecified psychosocial circumstances: Secondary | ICD-10-CM

## 2023-01-03 MED ORDER — METHOTREXATE SODIUM 2.5 MG PO TABS: 7.5000 mg | ORAL_TABLET | ORAL | Status: DC

## 2023-01-03 NOTE — Progress Notes (Unsigned)
His wife got out of her sling and she is improving. He has been caring for her and I thanked him for his effort.    D/w pt about xanax rx.  He had an episode with a nightmare.  This was clearly atypical.  He had gotten lost in the dream (not in life).  D/w pt about causes of nightmares.    Not sedated with xanax use.  It helped with anxiety.  He has mult nights with med use w/o nightmares.    Anxiety is better in the meantime, he isn't using xanax currently.    Still on methotrexate at baseline.   Meds, vitals, and allergies reviewed.   ROS: Per HPI unless specifically indicated in ROS section

## 2023-01-03 NOTE — Patient Instructions (Signed)
Update me as needed.  Take care.  Glad to see you. I think it would be okay to use the medicine if needed. Try to keep your sleep cycle on track.

## 2023-01-04 DIAGNOSIS — Z659 Problem related to unspecified psychosocial circumstances: Secondary | ICD-10-CM | POA: Insufficient documentation

## 2023-01-04 NOTE — Telephone Encounter (Signed)
error 

## 2023-01-04 NOTE — Assessment & Plan Note (Signed)
I thanked him for his effort caring for his wife.  His sleep cycle was clearly disrupted with helping her in the middle of the night.  He has used Xanax multiple times without having adverse effects.  I suspect that the nightmare was exacerbated by recent stressors and relative sleep deprivation.  I do not expect the medication to be the primary issue.  Discussed with patient.  His anxiety has improved in the meantime and he is not needing to use Xanax now.  I think he is still theoretically use in the future if needed with sedation caution.  He can update me as needed in the meantime.

## 2023-01-17 ENCOUNTER — Other Ambulatory Visit: Payer: Self-pay | Admitting: Family Medicine

## 2023-01-17 NOTE — Telephone Encounter (Signed)
Prescription Request  01/17/2023  LOV: 01/03/2023  What is the name of the medication or equipment? ALPRAZolam (XANAX) 0.25 MG tablet   Have you contacted your pharmacy to request a refill? Yes   Which pharmacy would you like this sent to?  [id number], [place] - [address] [place] [address] Phone: [phone] Fax: [phone]     Patient notified that their request is being sent to the clinical staff for review and that they should receive a response within 2 business days.   Please advise at [facility] [phone]

## 2023-01-18 NOTE — Telephone Encounter (Signed)
LOV:01/03/2023 NV: 02/07/2023 LAST REFILL: Xanax- 12/07/2022

## 2023-01-19 ENCOUNTER — Telehealth: Payer: Self-pay | Admitting: Family Medicine

## 2023-01-19 MED ORDER — ALPRAZOLAM 0.25 MG PO TABS: 0.1250 mg | ORAL_TABLET | Freq: Two times a day (BID) | ORAL | 1 refills | Status: DC | PRN

## 2023-01-19 NOTE — Telephone Encounter (Signed)
Pt called in and stated that he still haven't gotten his  ALPRAZolam Prudy Feeler) 0.25 MG tablet refill sent in he's been waiting since Monday pt stated pt would like a call back [phone]

## 2023-01-19 NOTE — Telephone Encounter (Signed)
See 01/19/23 phone note also.

## 2023-01-19 NOTE — Telephone Encounter (Signed)
Sent. Thanks.   

## 2023-01-19 NOTE — Telephone Encounter (Signed)
Pt was seen 01/03/23 and discussed taking alprazolam again. Pt said he thinks he needs one more refill of alprazolam taking due to some family issues going on;pt said he has 22 grandchildren. See 01/17/23 refill note pt has 4 alprazolam left. Pt is aware Dr Para March is out of office but looking at desktop.

## 2023-01-19 NOTE — Telephone Encounter (Signed)
LOV: 01/03/2023 NEXT VISIT: 02/07/2023 LAST REFILL: 9/18//2024 QTY: 30 TABLETS 0 REFILLS

## 2023-01-23 ENCOUNTER — Ambulatory Visit (INDEPENDENT_AMBULATORY_CARE_PROVIDER_SITE_OTHER): Payer: Medicare Other

## 2023-01-23 DIAGNOSIS — Z23 Encounter for immunization: Secondary | ICD-10-CM

## 2023-01-29 ENCOUNTER — Other Ambulatory Visit: Payer: Self-pay | Admitting: Family Medicine

## 2023-01-29 DIAGNOSIS — E785 Hyperlipidemia, unspecified: Secondary | ICD-10-CM

## 2023-01-31 ENCOUNTER — Other Ambulatory Visit (INDEPENDENT_AMBULATORY_CARE_PROVIDER_SITE_OTHER): Payer: Medicare Other

## 2023-01-31 DIAGNOSIS — E785 Hyperlipidemia, unspecified: Secondary | ICD-10-CM

## 2023-01-31 LAB — COMPREHENSIVE METABOLIC PANEL
ALT: 15 U/L (ref 0–53)
AST: 16 U/L (ref 0–37)
Albumin: 4.3 g/dL (ref 3.5–5.2)
Alkaline Phosphatase: 63 U/L (ref 39–117)
BUN: 11 mg/dL (ref 6–23)
CO2: 29 meq/L (ref 19–32)
Calcium: 9.1 mg/dL (ref 8.4–10.5)
Chloride: 97 meq/L (ref 96–112)
Creatinine, Ser: 0.77 mg/dL (ref 0.40–1.50)
GFR: 78.3 mL/min (ref >60.00)
Glucose, Bld: 96 mg/dL (ref 70–99)
Potassium: 4.5 meq/L (ref 3.5–5.1)
Sodium: 133 meq/L — ABNORMAL LOW (ref 135–145)
Total Bilirubin: 0.8 mg/dL (ref 0.2–1.2)
Total Protein: 6.4 g/dL (ref 6.0–8.3)

## 2023-01-31 LAB — CBC WITH DIFFERENTIAL/PLATELET
Basophils Absolute: 0 10*3/uL (ref 0.0–0.1)
Basophils Relative: 0.8 % (ref 0.0–3.0)
Eosinophils Absolute: 0.1 10*3/uL (ref 0.0–0.7)
Eosinophils Relative: 1.3 % (ref 0.0–5.0)
HCT: 46.3 % (ref 39.0–52.0)
Hemoglobin: 15.9 g/dL (ref 13.0–17.0)
Lymphocytes Relative: 18.6 % (ref 12.0–46.0)
Lymphs Abs: 0.8 10*3/uL (ref 0.7–4.0)
MCHC: 34.2 g/dL (ref 30.0–36.0)
MCV: 95.7 fL (ref 78.0–100.0)
Monocytes Absolute: 0.3 10*3/uL (ref 0.1–1.0)
Monocytes Relative: 7.5 % (ref 3.0–12.0)
Neutro Abs: 3.2 10*3/uL (ref 1.4–7.7)
Neutrophils Relative %: 71.8 % (ref 43.0–77.0)
Platelets: 142 10*3/uL — ABNORMAL LOW (ref 150.0–400.0)
RBC: 4.84 Mil/uL (ref 4.22–5.81)
RDW: 14.8 % (ref 11.5–15.5)
WBC: 4.5 10*3/uL (ref 4.0–10.5)

## 2023-01-31 LAB — LIPID PANEL
Cholesterol: 222 mg/dL — ABNORMAL HIGH (ref 0–200)
HDL: 43.6 mg/dL (ref >39.00)
LDL Cholesterol: 119 mg/dL — ABNORMAL HIGH (ref 0–99)
NonHDL: 178
Total CHOL/HDL Ratio: 5
Triglycerides: 296 mg/dL — ABNORMAL HIGH (ref 0.0–149.0)
VLDL: 59.2 mg/dL — ABNORMAL HIGH (ref 0.0–40.0)

## 2023-01-31 LAB — TSH: TSH: 2.9 u[IU]/mL (ref 0.35–5.50)

## 2023-02-02 ENCOUNTER — Telehealth: Payer: Self-pay

## 2023-02-02 NOTE — Telephone Encounter (Signed)
Pt walked in; pt was trying to jump a puddle and fell at [employer] parking lot. Pt has slightly open area at rt knuckle and abrasion like skin tears on rt ring and little finger; also skin tear on lt little finger. Skin tear on rt knee and pt hit head on right side of head on cement (no skin tear or lacertion noted andpt said does not have H/A.)no dizziness. No CP or SOB.. Pt said he feels like left hip might have pulled a muscle. BP 156/90 T97.4 P 72 and pulse oxy 92%. Pt had Td 10/13/22. Dr Para March is aware.sending note to Dr Para March and Para March pool.

## 2023-02-05 NOTE — Telephone Encounter (Signed)
Late entry.  Patient was seen after he was brought back into the clinic.  He was at [employer], fell trying to jump across a mud puddle, did not lose consciousness.  He was able to get up and then drive to clinic.  He has an abrasion on his right knee.  He does not have a head abrasion.  He is not having headache and he has normal conversation.  He scraped multiple knuckles.  See exam.  Other than being a little sore from the fall, he does not have other complaints.  He is not anticoagulated.  Nad Ncat, no scalp abrasion. CN 2-12 wnl B, S/S wnl x4 Superficial right knee abrasion near the patella covered with a Band-Aid and Neosporin after was cleaned.  No need for suture.  He has normal knee range of motion. Rrr Ctab  Hands with normal inspection except for abrasions as noted.  Right 2nd through 5th PIP abraded on the dorsum cortisone especially on the third digit.  Left fifth DIP has a similar horizontal abrasion.  None appear to need suture.  He still has good tissue apposition.  All were covered with Band-Aids and Neosporin after they were cleaned.  I did put a splint on his right third finger because I wanted to limit full flexion.  This is not because of any suspected fracture but I did not want him to irritate the abrasion more.  We talked about options.  He had normal mentation and appeared to be okay for outpatient follow-up.  It does not appear that he needs imaging at this point.  Routine cautions given to patient regarding falls, any new neurologic symptoms, and wound care and he can update [place] as needed.

## 2023-02-06 ENCOUNTER — Ambulatory Visit (INDEPENDENT_AMBULATORY_CARE_PROVIDER_SITE_OTHER): Payer: Medicare Other

## 2023-02-06 VITALS — Ht 64.0 in | Wt 154.0 lb

## 2023-02-06 DIAGNOSIS — Z0000 Encounter for general adult medical examination without abnormal findings: Secondary | ICD-10-CM

## 2023-02-06 NOTE — Patient Instructions (Signed)
Larry [name] , Thank you for taking time to come for your Medicare Wellness Visit. I appreciate your ongoing commitment to your health goals. Please review the following plan we discussed and let me know if I can assist you in the future.   Referrals/Orders/Follow-Ups/Clinician Recommendations: none  This is a list of the screening recommended for you and due dates:  Health Maintenance  Topic Date Due   Zoster (Shingles) Vaccine (1 of 2) 08/20/1981   COVID-19 Vaccine (4 - 2023-24 season) 11/20/2022   Medicare Annual Wellness Visit  02/06/2024   DTaP/Tdap/Td vaccine (4 - Tdap) 10/12/2032   Pneumonia Vaccine  Completed   Flu Shot  Completed   HPV Vaccine  Aged Out    Advanced directives: (Copy Requested) Please bring a copy of your health care power of attorney and living will to the office to be added to your chart at your convenience.  Next Medicare Annual Wellness Visit scheduled for next year: Yes 02/07/24 @ 9:30am telephone

## 2023-02-06 NOTE — Progress Notes (Signed)
Subjective:   Larry [name] is a 89 y.o. male who presents for Medicare Annual/Subsequent preventive examination.  Visit Complete: Virtual I connected with  Perlie Gold on 02/06/23 by a audio enabled telemedicine application and verified that I am speaking with the correct person using two identifiers.  Patient Location: Home  Provider Location: Home Office  I discussed the limitations of evaluation and management by telemedicine. The patient expressed understanding and agreed to proceed.  Vital Signs: Because this visit was a virtual/telehealth visit, some criteria may be missing or patient reported. Any vitals not documented were not able to be obtained and vitals that have been documented are patient reported.  Patient Medicare AWV questionnaire was completed by the patient on (not done); I have confirmed that all information answered by patient is correct and no changes since this date. Cardiac Risk Factors include: advanced age (>40men, >56 women);male gender;dyslipidemia;sedentary lifestyle    Objective:    Today's Vitals   02/06/23 0917  Weight: 154 lb (69.9 kg)  Height: 5\' 4"  (1.626 m)   Body mass index is 26.43 kg/m.     02/06/2023    9:34 AM 02/02/2022    9:40 AM 08/27/2021    1:36 AM 08/26/2021    2:04 PM 02/24/2020    4:59 PM 12/27/2019    1:15 PM 05/10/2019    9:00 AM  Advanced Directives  Does Patient Have a Medical Advance Directive? Yes No  No No;Yes Yes No  Type of Estate agent of [place];Living will     Healthcare Power of [place];Living will   Copy of Healthcare Power of Attorney in Chart? No - copy requested     No - copy requested   Would patient like information on creating a medical advance directive?  No - Patient declined Yes (Inpatient - patient requests chaplain consult to create a medical advance directive)    No - Patient declined    Current Medications (verified) Outpatient Encounter Medications as of 02/06/2023   Medication Sig   ALPRAZolam (XANAX) 0.25 MG tablet Take 0.5 tablets (0.125 mg total) by mouth 2 (two) times daily as needed for anxiety.   aspirin EC 81 MG tablet Take 1 tablet (81 mg total) by mouth daily. Swallow whole.   Coenzyme Q10 (CO Q 10 PO) Take 1 capsule by mouth daily.   EPINEPHrine 0.3 mg/0.3 mL IJ SOAJ injection INJECT 0.3 MLS (1 PEN) INTO THE MUSCLE ONCE. AS NEEDED   folic acid (FOLVITE) 1 MG tablet Take 1 mg by mouth daily.   KRILL OIL PO Take 1 tablet by mouth daily.   methotrexate (RHEUMATREX) 2.5 MG tablet Take 3 tablets (7.5 mg total) by mouth once a week. Caution:Chemotherapy. Protect from light.   Omega-3 Fatty Acids (FISH OIL PO) Take 1 tablet by mouth daily.   Red Yeast Rice Extract (RED YEAST RICE PO) Take 1 tablet by mouth daily.   SYNTHROID 50 MCG tablet TAKE 1 TABLET BY MOUTH DAILY  BEFORE BREAKFAST   calcium carbonate (TUMS - DOSED IN MG ELEMENTAL CALCIUM) 500 MG chewable tablet Chew 1 tablet by mouth daily as needed for indigestion or heartburn.   No facility-administered encounter medications on file as of 02/06/2023.    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   Esophageal reflux    Family  history of diabetes mellitus    History of chickenpox    Hypertriglyceridemia    Other and unspecified hyperlipidemia    Pre-syncope    Thrombocytopenia, unspecified (HCC)    Unspecified hypothyroidism    Past Surgical History:  Procedure Laterality Date   APPENDECTOMY  1949   Family History  Problem Relation Age of Onset   Stroke Mother    Diabetes Mother        borderline   Pneumonia Father    Hypertension Sister    Hyperlipidemia Sister    Alzheimer's disease Brother    Dementia Brother    Liver  disease Sister        cirrhosis, nondrinker   Arthritis Other    Diabetes Other        1st degree relative   Hyperlipidemia Other    Hypertension Other    Prostate cancer Neg Hx    Colon cancer Neg Hx    Social History   Socioeconomic History   Marital status: Married    Spouse name: Not on file   Number of children: 4   Years of education: Not on file   Highest education level: Not on file  Occupational History   Occupation: Retired from [employer]    Employer: RETIRED  Tobacco Use   Smoking status: Never   Smokeless tobacco: Never   Tobacco comments:    smoked only as a teenage small amount  Vaping Use   Vaping status: Never Used  Substance and Sexual Activity   Alcohol use: No   Drug use: No   Sexual activity: Not on file  Other Topics Concern   Not on file  Social History Narrative   Caffeine:  Coffee, tea, 2+ daily.   Diet:  Fish, chicken, vegetables   Regular exercise:  Yes, yard work, gardening   Married 1953   4 kids   Right handed   Social Determinants of Health   Financial Resource Strain: Low Risk  (02/06/2023)   Overall Financial Resource Strain (CARDIA)    Difficulty of Paying Living Expenses: Not hard at all  Food Insecurity: No Food Insecurity (02/06/2023)   Hunger Vital Sign    Worried About Running Out of Food in the Last Year: Never true    Ran Out of Food in the Last Year: Never true  Transportation Needs: No Transportation Needs (02/06/2023)   PRAPARE - Administrator, Civil Service (Medical): No    Lack of Transportation (Non-Medical): No  Physical Activity: Inactive (02/06/2023)   Exercise Vital Sign    Days of Exercise per Week: 0 days    Minutes of Exercise per Session: 0 min  Stress: No Stress Concern Present (02/06/2023)   [employer] of Occupational Health - Occupational Stress Questionnaire    Feeling of Stress : Only a little  Social Connections: Moderately Integrated (02/06/2023)   Social Connection and  Isolation Panel [NHANES]    Frequency of Communication with Friends and Family: More than three times a week    Frequency of Social Gatherings with Friends and Family: Three times a week    Attends Religious Services: More than 4 times per year    Active Member of Clubs or Organizations: No    Attends Banker Meetings: Never    Marital Status: Married    Tobacco Counseling Counseling given: Not Answered Tobacco comments: smoked only as a teenage small amount   Clinical Intake:  Pre-visit preparation completed: Yes  Pain :  No/denies pain    BMI - recorded: 26.43 Nutritional Status: BMI 25 -29 Overweight Nutritional Risks: None Diabetes: No  How often do you need to have someone help you when you read instructions, pamphlets, or other written materials from your doctor or pharmacy?: 1 - Never  Interpreter Needed?: No  Comments: lives with wife Information entered by :: B.Kearston Putman,LPN   Activities of Daily Living    02/06/2023    9:34 AM  In your present state of health, do you have any difficulty performing the following activities:  Hearing? 1  Vision? 0  Difficulty concentrating or making decisions? 0  Walking or climbing stairs? 1  Dressing or bathing? 0  Doing errands, shopping? 0  Preparing Food and eating ? N  Using the Toilet? N  In the past six months, have you accidently leaked urine? N  Do you have problems with loss of bowel control? N  Managing your Medications? N  Managing your Finances? N  Housekeeping or managing your Housekeeping? N    Patient Care Team: Joaquim Nam, MD as PCP - Regenia Skeeter, MD as Consulting Physician (Dermatology) Sidney Ace, MD as Referring Physician (Allergy)  Indicate any recent Medical Services you may have received from other than [facility] providers in the past year (date may be approximate).     Assessment:   This is a routine wellness examination for Larry [name].  Hearing/Vision  screen Hearing Screening - Comments:: Pt says his hearing is good with hearing aids; does not wear at home as much:can hear alright Vision Screening - Comments:: Pt says his vision is good with glasses (mostly reading) [facility]    Goals Addressed             This Visit's Progress    DIET - EAT MORE FRUITS AND VEGETABLES   Not on track    Increase water intake   Not on track    Starting 12/14/2017, I will attempt to drink at least 6-8 glasses of water daily to prevent dehydration.      Patient Stated   On track    12/27/2019, I will maintain and continue medications as prescribed.        Depression Screen    02/06/2023    9:32 AM 02/02/2022    9:35 AM 08/30/2021   10:10 AM 12/27/2019    1:17 PM 12/20/2018   10:15 AM 12/14/2017    8:14 AM 12/05/2016   10:52 AM  PHQ 2/9 Scores  PHQ - 2 Score 0 0 0 0 0 0 0  PHQ- 9 Score  0 0 0  0     Fall Risk    02/06/2023    9:24 AM 02/02/2022    9:43 AM 12/27/2019    1:17 PM 12/20/2018   10:15 AM 12/14/2017    8:14 AM  Fall Risk   Falls in the past year? 1 0 0 0 No  Number falls in past yr: 0 0 0    Injury with Fall? 1 0 0    Risk for fall due to : No Fall Risks No Fall Risks No Fall Risks    Follow up Education provided;Falls prevention discussed Falls prevention discussed;Falls evaluation completed Falls evaluation completed;Falls prevention discussed      MEDICARE RISK AT HOME: Medicare Risk at Home Any stairs in or around the home?: Yes If so, are there any without handrails?: Yes Home free of loose throw rugs in walkways, pet beds, electrical cords, etc?: Yes Adequate  lighting in your home to reduce risk of falls?: Yes Life alert?: No Use of a cane, walker or w/c?: No Grab bars in the bathroom?: No Shower chair or bench in shower?: No Elevated toilet seat or a handicapped toilet?: No  TIMED UP AND GO:  Was the test performed?  No    Cognitive Function:    12/27/2019    1:21 PM 12/14/2017    8:15 AM 11/29/2016    11:30 AM  MMSE - Mini Mental State Exam  Orientation to time 5 5 5   Orientation to Place 5 5 5   Registration 3 3 3   Attention/ Calculation 5 0 0  Recall 3 3 3   Language- name 2 objects  0 0  Language- repeat 1 1 1   Language- follow 3 step command  3 3  Language- read & follow direction  0 0  Write a sentence  0 0  Copy design  0 0  Total score  20 20        02/06/2023    9:37 AM 02/02/2022    9:44 AM  6CIT Screen  What Year? 0 points 0 points  What month? 0 points 0 points  What time? 0 points 0 points  Count back from 20 0 points 0 points  Months in reverse 4 points 0 points  Repeat phrase 0 points 0 points  Total Score 4 points 0 points    Immunizations Immunization History  Administered Date(s) Administered   Fluad Quad(high Dose 65+) 12/20/2018, 01/01/2020, 01/05/2022   Fluad Trivalent(High Dose 65+) 01/23/2023   Influenza Split 12/30/2010, 12/02/2011   Influenza Whole 01/13/2010   Influenza,inj,Quad PF,6+ Mos 01/08/2013, 01/16/2014, 12/26/2014, 11/27/2015, 01/04/2017, 01/11/2018, 01/02/2021   PFIZER(Purple Top)SARS-COV-2 Vaccination 04/09/2019, 05/04/2019, 01/23/2020   Pneumococcal Conjugate-13 10/23/2014   Pneumococcal Polysaccharide-23 10/18/2001   Td 09/19/2002, 11/19/2013, 10/13/2022   Zoster, Live 03/22/2007    TDAP status: Up to date  Flu Vaccine status: Up to date  Pneumococcal vaccine status: Up to date  Covid-19 vaccine status: Completed vaccines  Qualifies for Shingles Vaccine? Yes   Zostavax completed No   Shingrix Completed?: No.    Education has been provided regarding the importance of this vaccine. Patient has been advised to call insurance company to determine out of pocket expense if they have not yet received this vaccine. Advised may also receive vaccine at local pharmacy or Health Dept. Verbalized acceptance and understanding.  Screening Tests Health Maintenance  Topic Date Due   Zoster Vaccines- Shingrix (1 of 2) 08/20/1981    COVID-19 Vaccine (4 - 2023-24 season) 11/20/2022   Medicare Annual Wellness (AWV)  02/06/2024   DTaP/Tdap/Td (4 - Tdap) 10/12/2032   Pneumonia Vaccine 51+ Years old  Completed   INFLUENZA VACCINE  Completed   HPV VACCINES  Aged Out    Health Maintenance  Health Maintenance Due  Topic Date Due   Zoster Vaccines- Shingrix (1 of 2) 08/20/1981   COVID-19 Vaccine (4 - 2023-24 season) 11/20/2022    Colorectal cancer screening: No longer required.   Lung Cancer Screening: (Low Dose CT Chest recommended if Age 71-80 years, 20 pack-year currently smoking OR have quit w/in 15years.) does not qualify.   Lung Cancer Screening Referral: no  Additional Screening:  Hepatitis C Screening: does not qualify; Completed   Vision Screening: Recommended annual ophthalmology exams for early detection of glaucoma and other disorders of the eye. Is the patient up to date with their annual eye exam?  Yes  Who is the provider  or what is the name of the office in which the patient attends annual eye exams? [facility] If pt is not established with a provider, would they like to be referred to a provider to establish care? No .   Dental Screening: Recommended annual dental exams for proper oral hygiene  Diabetic Foot Exam: n/a  Community Resource Referral / Chronic Care Management: CRR required this visit?  No   CCM required this visit?  No    Plan:     I have personally reviewed and noted the following in the patient's chart:   Medical and social history Use of alcohol, tobacco or illicit drugs  Current medications and supplements including opioid prescriptions. Patient is not currently taking opioid prescriptions. Functional ability and status Nutritional status Physical activity Advanced directives List of other physicians Hospitalizations, surgeries, and ER visits in previous 12 months Vitals Screenings to include cognitive, depression, and falls Referrals and appointments  In  addition, I have reviewed and discussed with patient certain preventive protocols, quality metrics, and best practice recommendations. A written personalized care plan for preventive services as well as general preventive health recommendations were provided to patient.   Sue Lush, LPN   [id number]   After Visit Summary: (MyChart) Due to this being a telephonic visit, the after visit summary with patients personalized plan was offered to patient via MyChart   Nurse Notes: Pt reports he fell last week/Thursday in the [employer] parking lot. He states he "scrapped up"  his rt hand pretty good. He says he was seen in office to bandage up but is healing up now. Pt says PCP will check on it tomorrow when he has his PE. He has no concerns or questions at this time.

## 2023-02-07 ENCOUNTER — Ambulatory Visit: Payer: Medicare Other | Admitting: Family Medicine

## 2023-02-07 ENCOUNTER — Encounter: Payer: Self-pay | Admitting: Family Medicine

## 2023-02-07 VITALS — BP 128/76 | HR 66 | Temp 98.0°F | Ht 64.0 in | Wt 153.0 lb

## 2023-02-07 DIAGNOSIS — E039 Hypothyroidism, unspecified: Secondary | ICD-10-CM

## 2023-02-07 DIAGNOSIS — S7012XA Contusion of left thigh, initial encounter: Secondary | ICD-10-CM

## 2023-02-07 DIAGNOSIS — Z0000 Encounter for general adult medical examination without abnormal findings: Secondary | ICD-10-CM

## 2023-02-07 DIAGNOSIS — Z91018 Allergy to other foods: Secondary | ICD-10-CM | POA: Diagnosis not present

## 2023-02-07 DIAGNOSIS — F418 Other specified anxiety disorders: Secondary | ICD-10-CM

## 2023-02-07 DIAGNOSIS — Z7189 Other specified counseling: Secondary | ICD-10-CM

## 2023-02-07 DIAGNOSIS — T148XXA Other injury of unspecified body region, initial encounter: Secondary | ICD-10-CM

## 2023-02-07 DIAGNOSIS — E785 Hyperlipidemia, unspecified: Secondary | ICD-10-CM

## 2023-02-07 NOTE — Patient Instructions (Signed)
Don't change your meds for now.  Update me as needed.  Take care.  Glad to see you.   

## 2023-02-07 NOTE — Progress Notes (Unsigned)
Scabbed R knee is healing.  IP scrapes are healing.   Bruising medial distal L thigh, posterior.  Prev sore locally but improved now.   Normal mentation and no HA.  He used the finger splint on the right third finger in the meantime with relief.  That was removed at office visit today.  H/o alpha gal.  Cautions d/w pt.  No sx now.  He avoids trigger foods..  Hypothyroidism.  Still on replacement.  No ADE on med.  Compliant.    Hyperlipidemia.  Statin intolerant.  Can tolerate red yeast rice.  Labs discussed with patient.  Methotrexate per outside clinic.  Per dermatology.    Xanax use d/w pt.  His wife had sig injuries and he was caring for her. Stressors d/w pt.  Used prn.  No ADE on med. Cautions d/w pt.    Tetanus 2024 PNA UTD Shingles 2009 Flu shot 2024 covid vaccine 2021 RSV vaccine prev done at pharmacy- [facility] Larry [name], 2024 PSA and colon cancer screening deferred given age.   Advance directive-wife designated if patient were incapacitated.  Meds, vitals, and allergies reviewed.   ROS: Per HPI unless specifically indicated in ROS section   GEN: nad, alert and oriented HEENT: mucous membranes moist NECK: supple w/o LA CV: rrr.  PULM: ctab, no inc wob ABD: soft, +bs EXT: no edema SKIN: Well-perfused  Scabbed R knee-healing normally. IP scrapes are healing and scabbed over.  Normal range of motion. Bruising medial distal L thigh, posterior.  Prev sore locally per patient report but improved now.    31 minutes were devoted to patient care in this encounter (this includes time spent reviewing the patient's file/history, interviewing and examining the patient, counseling/reviewing plan with patient).

## 2023-02-08 DIAGNOSIS — T148XXA Other injury of unspecified body region, initial encounter: Secondary | ICD-10-CM | POA: Insufficient documentation

## 2023-02-08 NOTE — Assessment & Plan Note (Signed)
Advance directive- wife designated if patient were incapacitated.  

## 2023-02-08 NOTE — Assessment & Plan Note (Signed)
Tetanus 2024 PNA UTD Shingles 2009 Flu shot 2024 covid vaccine 2021 RSV vaccine prev done at pharmacy- [facility] [name], 2024 PSA and colon cancer screening deferred given age.   Advance directive-wife designated if patient were incapacitated.

## 2023-02-08 NOTE — Assessment & Plan Note (Signed)
Xanax use d/w pt.  His wife had sig injuries and he was caring for her. Stressors d/w pt.  Used prn.  No ADE on med. Cautions d/w pt.   he can update me as needed.  He expected to use the medication less going forward.

## 2023-02-08 NOTE — Assessment & Plan Note (Signed)
Healing, see above.  IP joint abrasions covered with Band-Aids at office visit.

## 2023-02-08 NOTE — Assessment & Plan Note (Signed)
Continue Synthroid °

## 2023-02-08 NOTE — Assessment & Plan Note (Signed)
Continue radiation raise.  Continue work on diet and exercise.

## 2023-02-08 NOTE — Assessment & Plan Note (Signed)
No symptoms.  Discussed continued trigger avoidance.

## 2023-03-17 ENCOUNTER — Telehealth: Payer: Self-pay

## 2023-03-17 NOTE — Telephone Encounter (Signed)
Copied from CRM [id number]. Topic: Clinical - Medical Advice >> Mar 17, 2023 11:45 AM Desma Mcgregor wrote: Reason for CRM: Pt is having toenail issues on both feet and might have an infection. Asking if he needs to have a referral for a podiatrist? Please follow up

## 2023-03-20 NOTE — Telephone Encounter (Signed)
Called patient. He requested office visit with our office for evaluation I have scheduled. Patient states that this has been ongoing for a few years. States it has just gotten to the point where his nail feels like its going to fall office.

## 2023-03-20 NOTE — Telephone Encounter (Signed)
If the issue in the nail itself or does he have an infection around the nail- ie onychomycosis vs paronychia?  Please let me know and I can work on the referral.  Thanks.

## 2023-03-21 NOTE — Telephone Encounter (Signed)
Noted. Thanks.

## 2023-03-28 ENCOUNTER — Ambulatory Visit: Payer: Medicare Other | Admitting: Family Medicine

## 2023-03-29 ENCOUNTER — Telehealth: Payer: Self-pay | Admitting: Family Medicine

## 2023-03-29 MED ORDER — OSELTAMIVIR PHOSPHATE 75 MG PO CAPS: 75.0000 mg | ORAL_CAPSULE | Freq: Every day | ORAL | 0 refills | Status: DC

## 2023-03-29 NOTE — Telephone Encounter (Signed)
 Patient's wife has the flu.   He requests Tamiflu  proph.  Medication Management during today's office visit: Meds ordered this encounter  Medications   oseltamivir  (TAMIFLU ) 75 MG capsule    Sig: Take 1 capsule (75 mg total) by mouth daily.    Dispense:  10 capsule    Refill:  0

## 2023-03-30 ENCOUNTER — Ambulatory Visit: Payer: Medicare Other | Admitting: Podiatry

## 2023-04-14 ENCOUNTER — Ambulatory Visit: Payer: Medicare Other | Admitting: Podiatry

## 2023-04-14 ENCOUNTER — Encounter: Payer: Self-pay | Admitting: Podiatry

## 2023-04-14 DIAGNOSIS — M79675 Pain in left toe(s): Secondary | ICD-10-CM | POA: Diagnosis not present

## 2023-04-14 DIAGNOSIS — B351 Tinea unguium: Secondary | ICD-10-CM | POA: Diagnosis not present

## 2023-04-14 DIAGNOSIS — M79674 Pain in right toe(s): Secondary | ICD-10-CM | POA: Diagnosis not present

## 2023-04-14 NOTE — Progress Notes (Unsigned)
  Subjective:  Patient ID: Larry [name], male    DOB: 1929/09/10,  MRN: [id number]  Chief Complaint  Patient presents with   Nail Problem    Has toenail fungus that he would like assessed. He also needs his nails trimmed. No callus/corns. Not diabetic. Takes ASA 81 mg.     89 y.o. male presents with the above complaint. History confirmed with patient. Patient presenting with pain related to dystrophic thickened elongated nails. Patient is unable to trim own nails related to nail dystrophy and/or mobility issues. Patient does not have a history of T2DM.  Objective:  Physical Exam: warm, good capillary refill nail exam onychomycosis of the toenails, onycholysis, and dystrophic nails DP pulses palpable, PT pulses palpable, and protective sensation intact.  +2 pitting edema bilaterally. Left Foot:  Pain with palpation of nails due to elongation and dystrophic growth.  Right Foot: Pain with palpation of nails due to elongation and dystrophic growth.   Assessment:   1. Pain due to onychomycosis of toenails of both feet      Plan:  Patient was evaluated and treated and all questions answered.  #Onychomycosis with pain  -Nails palliatively debrided as below. -Educated on self-care -Defer long-term oral medication for fungal toenails -Did discuss patient could try over-the-counter topical treatment, discussion that this is very low and effective medicine does require long-term duration of treatment.  Procedure: Nail Debridement Rationale: Pain Type of Debridement: manual, sharp debridement. Instrumentation: Nail nipper, rotary burr. Number of Nails: 10  Return in about 3 months (around 07/13/2023) for Routine Foot Care.         Bronwen Betters, DPM [facility] / [facility]

## 2023-05-30 ENCOUNTER — Ambulatory Visit: Admitting: Family Medicine

## 2023-06-22 ENCOUNTER — Ambulatory Visit: Payer: Self-pay

## 2023-06-22 NOTE — Telephone Encounter (Signed)
Noted, will see at OV tomorrow.  Thanks.

## 2023-06-22 NOTE — Telephone Encounter (Signed)
 Copied from CRM [id number]. Topic: Clinical - Red Word Triage >> Jun 22, 2023 10:16 AM Truddie Crumble wrote: Reason for CRM: patient called stating his left ankle has been swollen in the last few days and the patient stated the doctor knows about the swelling but the patient stated it has swollen up more   Chief Complaint: Left ankle swelling. No pain Symptoms: Above Frequency: 1 year Pertinent Negatives: Patient denies pain Disposition: [] ED /[] Urgent Care (no appt availability in office) / [x] Appointment(In office/virtual)/ []  [facility]/ [] Home Care/ [] Refused Recommended Disposition /[] [facility]/ []  Follow-up with PCP Additional Notes: Agrees with appointment.  Reason for Disposition  Ankle swelling is a chronic symptom (recurrent or ongoing AND present > 4 weeks)  Answer Assessment - Initial Assessment Questions 1. LOCATION: "Which ankle is swollen?" "Where is the swelling?"     Left ankle 2. ONSET: "When did the swelling start?"     1 year  3. SWELLING: "How bad is the swelling?" Or, "How large is it?" (e.g., mild, moderate, severe; size of localized swelling)    - NONE: No joint swelling.   - LOCALIZED: Localized; small area of puffy or swollen skin (e.g., insect bite, skin irritation).   - MILD: Joint looks or feels mildly swollen or puffy.   - MODERATE: Swollen; interferes with normal activities (e.g., work or school); decreased range of movement; may be limping.   - SEVERE: Very swollen; can't move swollen joint at all; limping a lot or unable to walk.     Mild 4. PAIN: "Is there any pain?" If Yes, ask: "How bad is it?" (Scale 1-10; or mild, moderate, severe)   - NONE (0): no pain.   - MILD (1-3): doesn't interfere with normal activities.    - MODERATE (4-7): interferes with normal activities (e.g., work or school) or awakens from sleep, limping.    - SEVERE (8-10): excruciating pain, unable to do any normal activities, unable to walk.      None 5. CAUSE:  "What do you think caused the ankle swelling?"     Unsure 6. OTHER SYMPTOMS: "Do you have any other symptoms?" (e.g., fever, chest pain, difficulty breathing, calf pain)     No 7. PREGNANCY: "Is there any chance you are pregnant?" "When was your last menstrual period?"     N/a  Protocols used: Ankle Swelling-A-AH

## 2023-06-23 ENCOUNTER — Ambulatory Visit: Admitting: Family Medicine

## 2023-06-27 ENCOUNTER — Ambulatory Visit: Admitting: Family Medicine

## 2023-06-28 ENCOUNTER — Ambulatory Visit: Payer: Self-pay

## 2023-06-28 MED ORDER — OMEPRAZOLE 20 MG PO CPDR: 20.0000 mg | DELAYED_RELEASE_CAPSULE | Freq: Every day | ORAL | Status: AC | PRN

## 2023-06-28 NOTE — Telephone Encounter (Signed)
 Chief Complaint: heartburn Symptoms: burning in upper abd after eating spicy foods and other types of foods Frequency: ongoing issues, occurs about 1 every 1-2 weeks Pertinent Negatives: Patient denies fever, SOB, N/V, sweating, arm pain,  Disposition: [] ED /[] Urgent Care (no appt availability in office) / [] Appointment(In office/virtual)/ []  [facility]/ [x] Home Care/ [] Refused Recommended Disposition /[] [facility]/ [x]  Follow-up with PCP Additional Notes: Pt states that he has been experiencing heartburn after eating spicy foods, occasionally after eating other types of foods as well. Pt states that this has been ongoing for awhile and that he usually takes tums, pepto, or prilosec. Pt states tums and pepto constipate him. Pt states all provide full relief. Pt states that the prilosec label states that he should do a 14 day treatment and then 4 months off prilosec. Pt states that he has been using the medication PRN, pt states that he has been using it only if symptomatic. Pt questioning if there is something different he can take or if he should cont doing what he is doing. Pt given care instructions per Epic. Pt advised that PCP will be consulted and pt will be contacted by clinic once PCP has made decision.    Copied from CRM [id number]. Topic: Clinical - Medical Advice >> Jun 28, 2023 10:15 AM Fuller Mandril wrote: Reason for CRM: Patient called. States he has bought prilosec to take for heartburn. Called the number on box to se if it was safe to take and they told him to reach out to primary care. Patient states he does not take it regularly but would like to speak with nurse about whether or not its ok to take. Thank You Reason for Disposition  Abdominal pain  Answer Assessment - Initial Assessment Questions 1. LOCATION: "Where does it hurt?"      Upper abd, right at bottom of where pt swallows 2. RADIATION: "Does the pain shoot anywhere else?" (e.g., chest, back)      denies 3. ONSET: "When did the pain begin?" (e.g., minutes, hours or days ago)      Intermittent for months, depends on what he eats 5. PATTERN "Does the pain come and go, or is it constant?"    - If it comes and goes: "How long does it last?" "Do you have pain now?"     (Note: Comes and goes means the pain is intermittent. It goes away completely between bouts.)    - If constant: "Is it getting better, staying the same, or getting worse?"      (Note: Constant means the pain never goes away completely; most serious pain is constant and gets worse.)      intermittent 6. SEVERITY: "How bad is the pain?"  (e.g., Scale 1-10; mild, moderate, or severe)    - MILD (1-3): Doesn't interfere with normal activities, abdomen soft and not tender to touch..     - MODERATE (4-7): Interferes with normal activities or awakens from sleep, abdomen tender to touch.     - SEVERE (8-10): Excruciating pain, doubled over, unable to do any normal activities.       Pt states that its "not that bad". 3-4 8. AGGRAVATING FACTORS: "Does anything seem to cause this pain?" (e.g., foods, stress, alcohol)     Spicy food 9. CARDIAC SYMPTOMS: "Do you have any of the following symptoms: chest pain, difficulty breathing, sweating, nausea?"     denies 10. OTHER SYMPTOMS: "Do you have any other symptoms?" (e.g., back pain, diarrhea, fever,  urination pain, vomiting)       denies  Protocols used: Abdominal Pain - Upper-A-AH

## 2023-06-28 NOTE — Telephone Encounter (Signed)
 If taking prilosec with relief, then could continue prn.  Could take daily vs daily as needed.    One option would be to take it on MWF and see if that prevents symptoms.  I would probably start with that and then increase to daily if needed.  Thanks.

## 2023-06-28 NOTE — Telephone Encounter (Signed)
 Spoke with patients wife Larry [name] who is on [place]. I did advise to start MWF and then increase to daily if that does not work with the prilosec. She verbalized understanding

## 2023-07-13 ENCOUNTER — Ambulatory Visit: Payer: Medicare Other | Admitting: Podiatry

## 2023-07-19 ENCOUNTER — Other Ambulatory Visit: Payer: Self-pay | Admitting: Family Medicine

## 2023-07-19 ENCOUNTER — Ambulatory Visit: Payer: Medicare Other | Admitting: Podiatry

## 2023-07-20 ENCOUNTER — Encounter: Payer: Self-pay | Admitting: Podiatry

## 2023-07-20 ENCOUNTER — Ambulatory Visit: Admitting: Podiatry

## 2023-07-20 DIAGNOSIS — M79674 Pain in right toe(s): Secondary | ICD-10-CM

## 2023-07-20 DIAGNOSIS — M79675 Pain in left toe(s): Secondary | ICD-10-CM | POA: Diagnosis not present

## 2023-07-20 DIAGNOSIS — B351 Tinea unguium: Secondary | ICD-10-CM

## 2023-07-20 NOTE — Progress Notes (Signed)
 This patient returns to my office for at risk foot care.  This patient requires this care by a professional since this patient will be at risk due to having thrombocytopenia. This patient is unable to cut nails himself since the patient cannot reach his nails.These nails are painful walking and wearing shoes.  This patient presents for at risk foot care today.  General Appearance  Alert, conversant and in no acute stress.  Vascular  Dorsalis pedis and posterior tibial  pulses are  weakly palpable  bilaterally.  Capillary return is within normal limits  bilaterally. Temperature is within normal limits  bilaterally.  Neurologic  Senn-Weinstein monofilament wire test within normal limits  bilaterally. Muscle power within normal limits bilaterally.  Nails Thick disfigured discolored nails with subungual debris  from hallux to fifth toes bilaterally. No evidence of bacterial infection or drainage bilaterally.  Orthopedic  No limitations of motion  feet .  No crepitus or effusions noted.  No bony pathology or digital deformities noted.  Skin  normotropic skin with no porokeratosis noted bilaterally.  No signs of infections or ulcers noted.     Onychomycosis  Pain in right toes  Pain in left toes  Consent was obtained for treatment procedures.   Mechanical debridement of nails 1-5  bilaterally performed with a nail nipper.  Filed with dremel without incident.    Return office visit     3 months                 Told patient to return for periodic foot care and evaluation due to potential at risk complications.   Ruffin Cotton DPM

## 2023-07-21 NOTE — Telephone Encounter (Signed)
 Sent. Thanks.

## 2023-07-24 ENCOUNTER — Ambulatory Visit: Payer: Self-pay

## 2023-07-24 ENCOUNTER — Other Ambulatory Visit: Payer: Self-pay

## 2023-07-24 ENCOUNTER — Emergency Department (HOSPITAL_COMMUNITY)

## 2023-07-24 ENCOUNTER — Emergency Department (HOSPITAL_COMMUNITY)
Admission: EM | Admit: 2023-07-24 | Discharge: 2023-07-24 | Disposition: A | Discharge status: Home / Self Care | Attending: Emergency Medicine | Admitting: Emergency Medicine

## 2023-07-24 ENCOUNTER — Encounter (HOSPITAL_COMMUNITY): Payer: Self-pay

## 2023-07-24 DIAGNOSIS — R4701 Aphasia: Secondary | ICD-10-CM | POA: Insufficient documentation

## 2023-07-24 DIAGNOSIS — Z7982 Long term (current) use of aspirin: Secondary | ICD-10-CM | POA: Diagnosis not present

## 2023-07-24 DIAGNOSIS — E871 Hypo-osmolality and hyponatremia: Secondary | ICD-10-CM | POA: Insufficient documentation

## 2023-07-24 DIAGNOSIS — E039 Hypothyroidism, unspecified: Secondary | ICD-10-CM | POA: Diagnosis not present

## 2023-07-24 LAB — CBC
HCT: 44.5 % (ref 39.0–52.0)
Hemoglobin: 15.3 g/dL (ref 13.0–17.0)
MCH: 32.5 pg (ref 26.0–34.0)
MCHC: 34.4 g/dL (ref 30.0–36.0)
MCV: 94.5 fL (ref 80.0–100.0)
Platelets: 151 10*3/uL (ref 150–400)
RBC: 4.71 MIL/uL (ref 4.22–5.81)
RDW: 13.3 % (ref 11.5–15.5)
WBC: 5.5 10*3/uL (ref 4.0–10.5)
nRBC: 0 % (ref 0.0–0.2)

## 2023-07-24 LAB — BASIC METABOLIC PANEL WITH GFR
Anion gap: 12 (ref 5–15)
BUN: 10 mg/dL (ref 8–23)
CO2: 22 mmol/L (ref 22–32)
Calcium: 9 mg/dL (ref 8.9–10.3)
Chloride: 98 mmol/L (ref 98–111)
Creatinine, Ser: 0.77 mg/dL (ref 0.61–1.24)
GFR, Estimated: >60 mL/min (ref >60)
Glucose, Bld: 112 mg/dL — ABNORMAL HIGH (ref 70–99)
Potassium: 4.4 mmol/L (ref 3.5–5.1)
Sodium: 132 mmol/L — ABNORMAL LOW (ref 135–145)

## 2023-07-24 LAB — CBG MONITORING, ED: Glucose-Capillary: 100 mg/dL — ABNORMAL HIGH (ref 70–99)

## 2023-07-24 MED ORDER — IOHEXOL 350 MG/ML SOLN
75.0000 mL | Freq: Once | INTRAVENOUS | Status: AC | PRN
  Administered 2023-07-24: 75 mL via INTRAVENOUS

## 2023-07-24 NOTE — Telephone Encounter (Signed)
 Agree.  Thanks.  Will await ER report.

## 2023-07-24 NOTE — Discharge Instructions (Addendum)
 Follow-up with [facility] neurology Return for new concerns.

## 2023-07-24 NOTE — ED Triage Notes (Signed)
 Pt BIB [facility] from home d/t stroke-like s/s that started at 1200 yesterday. Wife states that he began saying the wrong words when he would try to speak. EMS denies any other deficits, not on thinners, VSS @ 126/70, 81 bpm, 18 resp, CBG 94.

## 2023-07-24 NOTE — ED Provider Notes (Signed)
 Patient care signed up to follow-up head imaging results.  Discussed with neurology very small outpouching possible small aneurysm.  No indication for urgent treatment of this at this time.  Patient stable for follow-up with neurology in the clinic.  Patient got agitated waiting and pulled his IV out.  Updated patient and family on plan of care.   Clay Cummins, MD 07/24/23 [phone]

## 2023-07-24 NOTE — Telephone Encounter (Signed)
 Patient's wife called in stating yesterday patient had a moment of 'blanking out' where he couldn't form words, was confused, felt off. Patient has since been feeling bad and having trouble with words, and has had a headache. Patient denies weakness/numbness of arms or legs, double vision, chest pain. Patient advised to be seen in ED asap for evaluation. Patient's wife states she wants to call 911 for evaluation and transportation. This RN approved and advised to follow up with PCP after hospital visit.  Copied from CRM [id number]. Topic: Clinical - Red Word Triage >> Jul 24, 2023 11:28 AM Clydene Darner H wrote: [facility] that prompted transfer to Nurse Triage: Patient's wife called this morning requesting to schedule an appointment for her husband. She reported that the patient experienced a spell yesterday where he stated his mind "went black," he was unable to form his words, and remained confused for the rest of the day. He also developed a headache and, as of today, is not doing well. Reason for Disposition  Headache  (and neurologic deficit)  Answer Assessment - Initial Assessment Questions 1. SYMPTOM: "What is the main symptom you are concerned about?" (e.g., weakness, numbness)     Yesterday moment of 'blank' and confusion 2. ONSET: "When did this start?" (minutes, hours, days; while sleeping)     Yesterday 3. LAST NORMAL: "When was the last time you (the patient) were normal (no symptoms)?"     Ongoing since yesterday  4. PATTERN "Does this come and go, or has it been constant since it started?"  "Is it present now?"     Comes and goes 5. CARDIAC SYMPTOMS: "Have you had any of the following symptoms: chest pain, difficulty breathing, palpitations?"     No 6. NEUROLOGIC SYMPTOMS: "Have you had any of the following symptoms: headache, dizziness, vision loss, double vision, changes in speech, unsteady on your feet?"     Headache, confusion, mixed up with words 7. OTHER SYMPTOMS: "Do you have any  other symptoms?"     No  Protocols used: Neurologic Deficit-A-AH

## 2023-07-24 NOTE — ED Notes (Signed)
 Pt verbalized understanding of discharge instructions. Opportunity for questions provided.

## 2023-07-24 NOTE — ED Provider Notes (Signed)
 [name] EMERGENCY DEPARTMENT AT [facility] Provider Note   CSN: [id number] Arrival date & time: 07/24/23  1316     History  Chief Complaint  Patient presents with   Stroke-like s/s    Larry [name] is a 89 y.o. male.  HPI Patient presents with difficulty speaking.  States last night he had an episode where he could not speak.  States the words are right in his head but cannot come out.  Otherwise he was doing fine.  States lasted a few minutes.  Went away.  States today he was slightly confused.  States he did take a Xanax  last night after the event.  No numbness or weakness and no more difficulty speaking today.  Has had previous apples in the past.  Reviewed notes and appeared with workup negative.  Thought was that it was may be a complicated migraine versus seizure.  Negative EEG at that time but with recurrent episode thought was may need another EEG.  Reviewed neurology note.   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 Authorizing Provider  ALPRAZolam  (XANAX ) 0.25 MG tablet TAKE ONE-HALF TABLETS (0.125 MG TOTAL) BY MOUTH 2 (TWO) TIMES DAILY AS NEEDED FOR ANXIETY. 07/21/23   Donnie Galea, MD  aspirin  EC 81 MG tablet Take 1 tablet (81 mg total) by mouth daily. Swallow whole. 03/09/20   Donnie Galea, MD  calcium  carbonate (TUMS - DOSED IN MG ELEMENTAL CALCIUM ) 500 MG chewable tablet Chew 1 tablet by mouth daily as needed for indigestion or heartburn.    [provider]  Coenzyme Q10 (CO Q 10 PO) Take 1 capsule by mouth daily.    [provider]  EPINEPHrine  0.3 mg/0.3 mL IJ  SOAJ injection INJECT 0.3 MLS (1 PEN) INTO THE MUSCLE ONCE. AS NEEDED 02/14/18   Donnie Galea, MD  folic acid  (FOLVITE ) 1 MG tablet Take 1 mg by mouth daily.    [provider]  KRILL OIL PO Take 1 tablet by mouth daily.    [provider]  methotrexate  (RHEUMATREX) 2.5 MG tablet Take 3 tablets (7.5 mg total) by mouth once a week. Caution:Chemotherapy. Protect from light. 01/03/23   Donnie Galea, MD  Omega-3 Fatty Acids (FISH OIL PO) Take 1 tablet by mouth daily.    [provider]  omeprazole  (PRILOSEC) 20 MG capsule Take 1 capsule (20 mg total) by mouth daily as needed. 06/28/23   Donnie Galea, MD  Red Yeast Rice Extract (RED YEAST RICE PO) Take 1 tablet by mouth daily.    [provider]  SYNTHROID  50 MCG tablet TAKE 1 TABLET BY MOUTH DAILY  BEFORE BREAKFAST 10/18/22   Donnie Galea, MD      Allergies    Alpha-gal; Sulfur; Antihistamines, diphenhydramine-type; Atorvastatin; Beef-derived drug products; Ezetimibe; Other; Pork-derived products; Simvastatin; Statins; Sulfonamide derivatives; and Tramadol     Review of Systems   Review of Systems  Physical Exam Updated Vital Signs BP (!) 145/78   Pulse 77   Temp 98.8 F (37.1 C) (Oral)   Resp 17  Ht 5\' 4"  (1.626 m)   Wt 73.9 kg   SpO2 99%   BMI 27.98 kg/m  Physical Exam Vitals and nursing note reviewed.  Cardiovascular:     Rate and Rhythm: Regular rhythm.  Musculoskeletal:     Cervical back: Neck supple.  Neurological:     Mental Status: He is alert and oriented to person, place, and time.     Comments: Moving all extremities.  Normal speech.  Awake and appropriate.     ED Results / Procedures / Treatments   Labs (all labs ordered are listed, but only abnormal results are displayed) Labs Reviewed  BASIC METABOLIC PANEL WITH GFR - Abnormal; Notable for the following components:      Result Value   Sodium 132 (*)    Glucose, Bld 112 (*)    All other components within normal  limits  CBG MONITORING, ED - Abnormal; Notable for the following components:   Glucose-Capillary 100 (*)    All other components within normal limits  CBC    EKG EKG Interpretation Date/Time:  Monday Jul 24 2023 13:36:33 EDT Ventricular Rate:  75 PR Interval:  161 QRS Duration:  98 QT Interval:  395 QTC Calculation: 442 R Axis:   36  Text Interpretation: duplicate Confirmed by Mozell Arias [phone]) on 07/24/2023 2:00:28 PM  Radiology No results found.  Procedures Procedures    Medications Ordered in ED Medications - No data to display  ED Course/ Medical Decision Making/ A&P                                 Medical Decision Making Amount and/or Complexity of Data Reviewed Labs: ordered. Radiology: ordered.   Patient with difficulty speaking yesterday.  Similar episodes had in the past.  Did have a headache before it.  Differential diagnose includes stroke, TIA, seizure, complicated migraine.  Reviewed previous neurology note.  Will repeat CTA as was planned a couple years ago.  Was plan to follow-up again in year but had not had it done.  If no further episodes I think likely can be discharged home with follow-up with neurology.  Potentially complicated migraine since did have the headache.  TIA felt less likely.  Care turned over to Dr. Salomon Cree.        Final Clinical Impression(s) / ED Diagnoses Final diagnoses:  Aphasia    Rx / DC Orders ED Discharge Orders     None         Mozell Arias, MD 07/24/23 1520

## 2023-07-31 ENCOUNTER — Ambulatory Visit: Admitting: Family Medicine

## 2023-07-31 ENCOUNTER — Encounter: Payer: Self-pay | Admitting: Family Medicine

## 2023-07-31 VITALS — BP 124/78 | HR 70 | Temp 98.5°F | Ht 64.0 in | Wt 158.0 lb

## 2023-07-31 DIAGNOSIS — R2990 Unspecified symptoms and signs involving the nervous system: Secondary | ICD-10-CM | POA: Diagnosis not present

## 2023-07-31 DIAGNOSIS — R4789 Other speech disturbances: Secondary | ICD-10-CM

## 2023-07-31 DIAGNOSIS — F418 Other specified anxiety disorders: Secondary | ICD-10-CM

## 2023-07-31 NOTE — Patient Instructions (Signed)
 Let me know if you don't get a call about seeing neurology.  We'll go from there.  Take care.  Glad to see you.

## 2023-07-31 NOTE — Progress Notes (Unsigned)
 ER follow up.  ER course and presentation d/w pt.    He had altered speech, had HA.  He had taken 1/2 tab of xanax  prn, when anxious.  H/o episodic use.  No clear ADE on med.  The ER eval was clearly stressful for the patient.    Discussed with patient about previous imaging.   IMPRESSION: No large vessel occlusion.  No high-grade stenosis of the arteries in the head and neck.   Atherosclerosis as above. Mild-to-moderate stenosis of the P2 segment left PCA.   1.5 mm outpouching along the left supraclinoid ICA which may reflect an infundibulum versus small aneurysm.   No CT evidence of acute intracranial abnormality.   Chronic microvascular ischemic changes and mild parenchymal volume loss.  Discussed that he has a family history of atypical migraine, ie with visual aura w/o HA- daughter.   Meds, vitals, and allergies reviewed.   ROS: Per HPI unless specifically indicated in ROS section   Nad Ncat Neck supple no LA Rrr Ctab Abd soft, not ttp Speech normal. Strength and sensation are grossly intact to the extremities.  33 minutes were devoted to patient care in this encounter (this includes time spent reviewing the patient's file/history, interviewing and examining the patient, counseling/reviewing plan with patient).

## 2023-08-02 NOTE — Assessment & Plan Note (Signed)
 I do not think his episodic/rare use of Xanax  had contributed to his symptoms.  Discussed that he has had a significant amount of stress caring for his wife and he is trying to manage that.

## 2023-08-02 NOTE — Assessment & Plan Note (Signed)
 He appears back at baseline.  He did not have typical seizure-like symptoms.  He does have a family history of atypical migraine.  I think it makes sense to see neurology and I put in the referral.  We talked about the pathophysiology of atypical migraines.  I think it makes sense not to change her medications for now and follow-up with neurology.  He agrees with plan.

## 2023-08-08 ENCOUNTER — Encounter: Payer: Self-pay | Admitting: Neurology

## 2023-08-08 ENCOUNTER — Ambulatory Visit: Admitting: Neurology

## 2023-08-08 VITALS — BP 126/74 | Ht 64.0 in | Wt 157.5 lb

## 2023-08-08 DIAGNOSIS — R4789 Other speech disturbances: Secondary | ICD-10-CM | POA: Diagnosis not present

## 2023-08-08 DIAGNOSIS — R413 Other amnesia: Secondary | ICD-10-CM | POA: Diagnosis not present

## 2023-08-08 NOTE — Progress Notes (Signed)
 Chief Complaint  Patient presents with   New Patient (Initial Visit)    RM 14, NP, changes in speech, expressive aphasia    ASSESSMENT AND PLAN  [name] is a 89 y.o. male  Episodes of Word finding difficulties Mild cognitive impairment  MOCA  23/30  Laboratory evaluation to rule out treatable etiology, including B12, CMP,  The transient word finding difficulties can be due to underlying mild cognitive impairment,   Multiple recurrent benign episode, less likely TIA or partial seizure  Previous mild abnormal EEG will repeat EEG as well   Will continue follow-up with primary care, Return to clinic for new issues  DIAGNOSTIC DATA (LABS, IMAGING, TESTING) - I reviewed patient records, labs, notes, testing and imaging myself where available.   MEDICAL HISTORY:  Larry [name], is a 89 year old male, accompanied by his daughter seen in request by his primary care from [facility] Dr. Richrd Char for evaluation of episode of word finding difficulties, initial evaluation was on Aug 08, 2023  History is obtained from the patient and review of electronic medical records. I personally reviewed pertinent available imaging films in PACS.   PMHx of  Hypothyrodism Papular atopic dermatitis, on methotrexate   He lives at home with his wife, still active, doing house chores, enjoying yard work, he was noted to have slight memory loss, there were 5 episode of sudden word finding difficulties  Most recent episode was on May 4, he was talking with his wife, has difficulty getting the words out, only last for few minutes, there was no associated weakness, presented to emergency room next day on Jul 24, 2023, worry about TIA  CT angiogram of head and neck showed no large vessel disease,  Hospital admission on August 26, 2021 for headache, mild confusion, word finding difficulties, MRI of the brain without contrast showed mild small vessel disease generalized atrophy no acute  abnormality  EEG showed left frontotemporal region slow  Echocardiogram showed normal ejection fraction 55%, no regional wall motion abnormalities.  PHYSICAL EXAM:   Vitals:   08/08/23 1557  BP: 126/74  Weight: 157 lb 8 oz (71.4 kg)  Height: 5\' 4"  (1.626 m)     Body mass index is 27.03 kg/m.  PHYSICAL EXAMNIATION:  Gen: NAD, conversant, well nourised, well groomed                     Cardiovascular: Regular rate rhythm, no peripheral edema, warm, nontender. Eyes: Conjunctivae clear without exudates or hemorrhage Neck: Supple, no carotid bruits. Pulmonary: Clear to auscultation bilaterally   NEUROLOGICAL EXAM:  MENTAL STATUS: Speech/cognition: Awake, alert, oriented to history taking and casual conversation CRANIAL NERVES: CN II: Visual fields are full to confrontation. Pupils are round equal and briskly reactive to light. CN III, IV, VI: extraocular movement are normal. No ptosis. CN V: Facial sensation is intact to light touch CN VII: Face is symmetric with normal eye closure  CN VIII: Hearing is normal to causal conversation. CN IX, X: Phonation is normal. CN XI: Head turning and shoulder shrug are intact  MOTOR: There is no pronator drift of out-stretched arms. Muscle bulk and tone are normal. Muscle strength is normal.  REFLEXES: Reflexes are 1 and symmetric at the biceps, triceps, knees, and ankles. Plantar responses are flexor.  SENSORY: Mildly length-dependent sensory changes, vibratory sensation  COORDINATION: There is no trunk or limb dysmetria noted.  GAIT/STANCE: Posture is normal. Gait is steady  REVIEW OF SYSTEMS:  Full 14 system review of  systems performed and notable only for as above All other review of systems were negative.   ALLERGIES: Allergies  Allergen Reactions   Alpha-Gal Other (See Comments)   Sulfur Itching   Antihistamines, Diphenhydramine-Type     Urinary retention   Atorvastatin     REACTION: Intolerance   Beef-Derived  Drug Products     Tolerates chicken/fish/turkey   Ezetimibe     REACTION: Intolerance   Other     ALPHAGAL- see above re: beef allergy    Pork-Derived Products Other (See Comments)    Due to alpha gal hx   Simvastatin     REACTION: Intolerance   Statins Other (See Comments)   Sulfonamide Derivatives     REACTION: hives, itching   Tramadol  Other (See Comments)    Lightheaded    HOME MEDICATIONS: Current Outpatient Medications  Medication Sig Dispense Refill   ALPRAZolam  (XANAX ) 0.25 MG tablet TAKE ONE-HALF TABLETS (0.125 MG TOTAL) BY MOUTH 2 (TWO) TIMES DAILY AS NEEDED FOR ANXIETY. 30 tablet 1   aspirin  EC 81 MG tablet Take 1 tablet (81 mg total) by mouth daily. Swallow whole.     calcium  carbonate (TUMS - DOSED IN MG ELEMENTAL CALCIUM ) 500 MG chewable tablet Chew 1 tablet by mouth daily as needed for indigestion or heartburn.     Coenzyme Q10 (CO Q 10 PO) Take 1 capsule by mouth daily.     folic acid  (FOLVITE ) 1 MG tablet Take 1 mg by mouth daily.     KRILL OIL PO Take 1 tablet by mouth daily.     methotrexate  (RHEUMATREX) 2.5 MG tablet Take 3 tablets (7.5 mg total) by mouth once a week. Caution:Chemotherapy. Protect from light.     Omega-3 Fatty Acids (FISH OIL PO) Take 1 tablet by mouth daily.     Red Yeast Rice Extract (RED YEAST RICE PO) Take 1 tablet by mouth daily.     SYNTHROID  50 MCG tablet TAKE 1 TABLET BY MOUTH DAILY  BEFORE BREAKFAST 90 tablet 3   EPINEPHrine  0.3 mg/0.3 mL IJ SOAJ injection INJECT 0.3 MLS (1 PEN) INTO THE MUSCLE ONCE. AS NEEDED 2 Device 0   omeprazole  (PRILOSEC) 20 MG capsule Take 1 capsule (20 mg total) by mouth daily as needed. (Patient not taking: Reported on 08/08/2023)     No current facility-administered medications for this visit.    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   Esophageal reflux    Family history of diabetes mellitus    History of chickenpox    Hypertriglyceridemia     Other and unspecified hyperlipidemia    Pre-syncope    Thrombocytopenia, unspecified (HCC)    Unspecified hypothyroidism     PAST SURGICAL HISTORY: Past Surgical History:  Procedure Laterality Date   APPENDECTOMY  1949    FAMILY HISTORY: Family History  Problem Relation Age of Onset   Stroke Mother    Diabetes Mother        borderline   Pneumonia Father    Hypertension Sister    Hyperlipidemia Sister    Alzheimer's disease Brother    Dementia Brother    Liver disease Sister        cirrhosis, nondrinker   Arthritis Other    Diabetes Other        1st degree relative   Hyperlipidemia Other    Hypertension Other    Prostate cancer Neg Hx    Colon cancer  Neg Hx     SOCIAL HISTORY: Social History   Socioeconomic History   Marital status: Married    Spouse name: Not on file   Number of children: 4   Years of education: Not on file   Highest education level: Not on file  Occupational History   Occupation: Retired from [employer]    Employer: RETIRED  Tobacco Use   Smoking status: Never   Smokeless tobacco: Never   Tobacco comments:    smoked only as a teenage small amount  Vaping Use   Vaping status: Never Used  Substance and Sexual Activity   Alcohol use: No   Drug use: No   Sexual activity: Not on file  Other Topics Concern   Not on file  Social History Narrative   Caffeine:  Coffee, tea, 2+ daily.   Diet:  Fish, chicken, vegetables   Regular exercise:  Yes, yard work, gardening   Married 1953   4 kids   Right handed   Social Drivers of Health   Financial Resource Strain: Low Risk  (02/06/2023)   Overall Financial Resource Strain (CARDIA)    Difficulty of Paying Living Expenses: Not hard at all  Food Insecurity: No Food Insecurity (02/06/2023)   Hunger Vital Sign    Worried About Running Out of Food in the Last Year: Never true    Ran Out of Food in the Last Year: Never true  Transportation Needs: No Transportation Needs (02/06/2023)    PRAPARE - Administrator, Civil Service (Medical): No    Lack of Transportation (Non-Medical): No  Physical Activity: Inactive (02/06/2023)   Exercise Vital Sign    Days of Exercise per Week: 0 days    Minutes of Exercise per Session: 0 min  Stress: No Stress Concern Present (02/06/2023)   [employer] of Occupational Health - Occupational Stress Questionnaire    Feeling of Stress : Only a little  Social Connections: Moderately Integrated (02/06/2023)   Social Connection and Isolation Panel [NHANES]    Frequency of Communication with Friends and Family: More than three times a week    Frequency of Social Gatherings with Friends and Family: Three times a week    Attends Religious Services: More than 4 times per year    Active Member of Clubs or Organizations: No    Attends Banker Meetings: Never    Marital Status: Married  Catering manager Violence: Not At Risk (02/06/2023)   Humiliation, Afraid, Rape, and Kick questionnaire    Fear of Current or Ex-Partner: No    Emotionally Abused: No    Physically Abused: No    Sexually Abused: No      Phebe Brasil, M.D. Ph.D.  [facility] [name] [place], [place] [address] Ph: [phone] Fax: [phone]  CC:  Donnie Galea, MD [address] [place],  [place] [address]  Donnie Galea, MD

## 2023-08-09 ENCOUNTER — Telehealth: Payer: Self-pay | Admitting: Neurology

## 2023-08-09 LAB — COMPREHENSIVE METABOLIC PANEL WITH GFR
ALT: 12 IU/L (ref 0–44)
AST: 14 IU/L (ref 0–40)
Albumin: 4.6 g/dL (ref 3.6–4.6)
Alkaline Phosphatase: 71 IU/L (ref 44–121)
BUN/Creatinine Ratio: 14 (ref 10–24)
BUN: 10 mg/dL (ref 10–36)
Bilirubin Total: 0.5 mg/dL (ref 0.0–1.2)
CO2: 22 mmol/L (ref 20–29)
Calcium: 9.3 mg/dL (ref 8.6–10.2)
Chloride: 92 mmol/L — ABNORMAL LOW (ref 96–106)
Creatinine, Ser: 0.74 mg/dL — ABNORMAL LOW (ref 0.76–1.27)
Globulin, Total: 1.9 g/dL (ref 1.5–4.5)
Glucose: 101 mg/dL — ABNORMAL HIGH (ref 70–99)
Potassium: 4.8 mmol/L (ref 3.5–5.2)
Sodium: 130 mmol/L — ABNORMAL LOW (ref 134–144)
Total Protein: 6.5 g/dL (ref 6.0–8.5)
eGFR: 86 mL/min/{1.73_m2} (ref >59)

## 2023-08-09 LAB — VITAMIN B12: Vitamin B-12: 192 pg/mL — ABNORMAL LOW (ref 232–1245)

## 2023-08-09 NOTE — Telephone Encounter (Signed)
 Please call patient, laboratory evaluation showed:  --Decreased sodium 130, further decreased compared to baseline level   -- Decreased B12 192, need intramuscular supplement  Above findings could be contributing some of his complaints such as word finding difficulties, I have forwarded result to his primary care Donnie Galea, MD, he should contact Dr. Vallarie Gauze for B12 supplement, and management of hyponatremia

## 2023-08-10 NOTE — Telephone Encounter (Signed)
 Call to patient, reviewed results. Patient verbalized understanding.

## 2023-08-11 ENCOUNTER — Ambulatory Visit: Admitting: Family Medicine

## 2023-08-11 VITALS — BP 138/78 | HR 72 | Temp 98.5°F | Ht 64.0 in | Wt 157.0 lb

## 2023-08-11 DIAGNOSIS — E538 Deficiency of other specified B group vitamins: Secondary | ICD-10-CM | POA: Diagnosis not present

## 2023-08-11 DIAGNOSIS — E871 Hypo-osmolality and hyponatremia: Secondary | ICD-10-CM | POA: Diagnosis not present

## 2023-08-11 MED ORDER — CYANOCOBALAMIN 1000 MCG/ML IJ SOLN
1000.0000 ug | Freq: Once | INTRAMUSCULAR | Status: AC
  Administered 2023-08-11: 1000 ug via INTRAMUSCULAR

## 2023-08-11 MED ORDER — CYANOCOBALAMIN 1000 MCG/ML IJ SOLN: 1000.0000 ug | INTRAMUSCULAR | Status: AC

## 2023-08-11 NOTE — Patient Instructions (Addendum)
 B12 dose today.  Repeat B12 dose every 2 weeks.  Recheck B12 level in about 3 months.    Go to the lab on the way out.   If you have mychart we'll likely use that to update you.    Try adding a little salt to meals.  Recheck sodium level with next B12 dose.   Take care.  Glad to see you.

## 2023-08-11 NOTE — Progress Notes (Unsigned)
 B12 low.  D/w pt about IM vs PO replacement.  Opts for q14 day IM dose here.  1st dose today.   Lower Na+.  He is on a low salt diet at baseline.  Discussed getting follow-up labs done.  Labs d/w pt. not on diuretics.  He has neuro f/u pending.   Meds, vitals, and allergies reviewed.   ROS: Per HPI unless specifically indicated in ROS section   Nad Ncat Neck supple, no LA Rrr Ctab Abd soft, not ttp Skin well-perfused. Trace BLE in compression stockings.

## 2023-08-14 ENCOUNTER — Ambulatory Visit: Payer: Self-pay | Admitting: Family Medicine

## 2023-08-14 DIAGNOSIS — E871 Hypo-osmolality and hyponatremia: Secondary | ICD-10-CM | POA: Insufficient documentation

## 2023-08-14 NOTE — Assessment & Plan Note (Signed)
 See notes on labs. He can try adding a little salt to meals.  Recheck sodium level with next B12 dose.

## 2023-08-14 NOTE — Assessment & Plan Note (Signed)
 B12 dose today.  Repeat B12 dose every 2 weeks.  Recheck B12 level in about 3 months.

## 2023-08-15 ENCOUNTER — Other Ambulatory Visit: Admitting: *Deleted

## 2023-08-16 LAB — OSMOLALITY, URINE: Osmolality, Ur: 311 mosm/kg (ref 50–1200)

## 2023-08-16 LAB — SODIUM, URINE, RANDOM: Sodium, Ur: 64 mmol/L (ref 28–272)

## 2023-08-22 ENCOUNTER — Ambulatory Visit: Admitting: Neurology

## 2023-08-22 DIAGNOSIS — R413 Other amnesia: Secondary | ICD-10-CM | POA: Diagnosis not present

## 2023-08-22 DIAGNOSIS — R4789 Other speech disturbances: Secondary | ICD-10-CM

## 2023-08-29 ENCOUNTER — Ambulatory Visit

## 2023-08-29 ENCOUNTER — Other Ambulatory Visit (INDEPENDENT_AMBULATORY_CARE_PROVIDER_SITE_OTHER)

## 2023-08-29 ENCOUNTER — Other Ambulatory Visit

## 2023-08-29 DIAGNOSIS — E871 Hypo-osmolality and hyponatremia: Secondary | ICD-10-CM | POA: Diagnosis not present

## 2023-08-29 DIAGNOSIS — E538 Deficiency of other specified B group vitamins: Secondary | ICD-10-CM

## 2023-08-29 MED ORDER — CYANOCOBALAMIN 1000 MCG/ML IJ SOLN
1000.0000 ug | Freq: Once | INTRAMUSCULAR | Status: AC
  Administered 2023-08-29: 1000 ug via INTRAMUSCULAR

## 2023-08-29 NOTE — Progress Notes (Signed)
 Per orders of Dr. Richrd Char, injection of B-12 given by Doretha Ganja in right deltoid. Patient tolerated injection well. Patient will make appointment for 2 week.

## 2023-08-29 NOTE — Addendum Note (Signed)
 Addended by: Gerry Krone on: 08/29/2023 04:02 PM   Modules accepted: Orders

## 2023-08-30 LAB — BASIC METABOLIC PANEL WITH GFR
BUN: 11 mg/dL (ref 7–25)
CO2: 28 mmol/L (ref 20–32)
Calcium: 9.2 mg/dL (ref 8.6–10.3)
Chloride: 96 mmol/L — ABNORMAL LOW (ref 98–110)
Creat: 0.73 mg/dL (ref 0.70–1.22)
Glucose, Bld: 97 mg/dL (ref 65–99)
Potassium: 3.9 mmol/L (ref 3.5–5.3)
Sodium: 132 mmol/L — ABNORMAL LOW (ref 135–146)
eGFR: 85 mL/min/1.73m2

## 2023-08-31 NOTE — Procedures (Signed)
   HISTORY: 89 year old male with history of mild cognitive impairment, intermittent word finding difficulties  TECHNIQUE:  This is a routine 16 channel EEG recording with one channel devoted to a limited EKG recording.  It was performed during wakefulness, drowsiness and asleep.  Photic stimulation were performed as activating procedures.  There are frequent bilateral frontal muscle  and eye movement artifact  Upon maximum arousal, posterior dominant waking rhythm consistent of mildly dysrhythmic theta range activity. Activities are symmetric over the bilateral posterior derivations and attenuated with eye opening.  Photic stimulation did not alter the tracing.  Hyperventilation was not performed due to age  During EEG recording, patient developed drowsiness and no deeper stage of sleep was achieved  During EEG recording, there was no epileptiform discharge noted.  EKG demonstrate normal sinus rhythm.  CONCLUSION: This is a mild abnormal EEG.  There is evidence of mild background slowing indicating mild bihemispheric malfunction  Phebe Brasil, M.D. Ph.D.  [facility] [address] [place], [place] [address] Phone: [phone] Fax:      [phone]

## 2023-09-04 ENCOUNTER — Telehealth: Payer: Self-pay | Admitting: Family Medicine

## 2023-09-04 NOTE — Telephone Encounter (Signed)
 Please triage patient.  Per patient's wife, he had an episode of altered speech about 2 weeks ago.  No LOC.  He is back to baseline in the meantime.  Please triage patient about his situation.    I would like to route this to neuro after triage for input.  Thanks.

## 2023-09-04 NOTE — Telephone Encounter (Signed)
 Patient was in office with wife. Was able to review symptoms with him. Below is information from our conversation.   Patient states that the only thing he knows anything about is when he called someone's name and mispronounced it. States his wife was the only one that had been around when it happened. States that is the only thing that he can think of. Patient states when he called the person or place he doesn't remember what it was he did not say right but was able to correct it. States the only thing he can notices is change in memory but figures that comes with his age. States he has been checked out by neurology and in hospital and every one has told him he is find. Patient sates he does not have any issues identifying objects name. He does admit to asking his wife where things are or what they are a lot of the time but that is not new for him. Denies any weakness in arms or legs. Has not had any headaches or changes in vision. I have reviewed with Dr. Vallarie Gauze. He has spoke to patient and let him know he would like to reach out to neurology to make sure not further work up is wanted on their end.

## 2023-09-05 NOTE — Telephone Encounter (Signed)
 Noted, wife was concerned at her office visit and that led to the conversation with the patient.  His speech and mentation were normal in my conversation with the patient at the office visit.  He happened to be here in clinic with his wife.  We did not change his medications.  I do not think there is anything else to do at this point but I was going to route this note as FYI to neurology.  I thank all involved.

## 2023-09-07 ENCOUNTER — Other Ambulatory Visit: Payer: Self-pay | Admitting: Family Medicine

## 2023-09-12 ENCOUNTER — Ambulatory Visit (INDEPENDENT_AMBULATORY_CARE_PROVIDER_SITE_OTHER)

## 2023-09-12 DIAGNOSIS — E538 Deficiency of other specified B group vitamins: Secondary | ICD-10-CM | POA: Diagnosis not present

## 2023-09-12 MED ORDER — CYANOCOBALAMIN 1000 MCG/ML IJ SOLN
1000.0000 ug | Freq: Once | INTRAMUSCULAR | Status: AC
Start: 2023-09-12 — End: 2023-09-12
  Administered 2023-09-12: 1000 ug via INTRAMUSCULAR

## 2023-09-12 NOTE — Progress Notes (Signed)
 Per orders of Dr. Crawford Givens, injection of B-12 given by Melina Copa in left deltoid. Patient tolerated injection well. Patient will make appointment for 2 week.

## 2023-10-23 ENCOUNTER — Ambulatory Visit: Admitting: Podiatry

## 2023-10-25 ENCOUNTER — Ambulatory Visit: Admitting: Podiatry

## 2023-10-25 ENCOUNTER — Ambulatory Visit: Payer: Self-pay

## 2023-10-25 NOTE — Telephone Encounter (Signed)
 Noted. Thanks.

## 2023-10-25 NOTE — Telephone Encounter (Signed)
 FYI Only or Action Required?: FYI only for provider.  Patient was last seen in primary care on 08/11/2023 by Cleatus Arlyss RAMAN, MD.  Called Nurse Triage reporting Otalgia.  Symptoms began today.  Interventions attempted: OTC medications: Tylenol .  Symptoms are: unchanged.  Triage Disposition: See Physician Within 24 Hours  Patient/caregiver understands and will follow disposition?: Yes Copied from CRM #[id number]. Topic: Clinical - Red Word Triage >> Oct 25, 2023  3:32 PM [place] A wrote: [facility] that prompted transfer to Nurse Triage: Patient said he is having a ear ache since this morning Reason for Disposition  Earache  (Exceptions: Brief ear pain of lasting less than 60 minutes, or earache occurring during air travel.)  Answer Assessment - Initial Assessment Questions 1. LOCATION: Which ear is involved?     Left ear  2. ONSET: When did the ear pain start?      Started a couple of hours ago  3. SEVERITY: How bad is the pain?  (Scale 1-10; mild, moderate or severe)     Mild pain, relieved with tylenol   4. URI SYMPTOMS: Do you have a runny nose or cough?     No  5. FEVER: Do you have a fever? If Yes, ask: What is your temperature, how was it measured, and when did it start?     No fever  6. CAUSE: Have you been swimming recently?, How often do you use Q-TIPS?, Have you had any recent air travel or scuba diving?     No  7. OTHER SYMPTOMS: Do you have any other symptoms? (e.g., decreased hearing, dizziness, headache, stiff neck, vomiting)     No  Protocols used: Earache-A-AH

## 2023-10-26 ENCOUNTER — Ambulatory Visit: Admitting: Family Medicine

## 2023-10-30 ENCOUNTER — Ambulatory Visit: Admitting: Podiatry

## 2023-10-31 ENCOUNTER — Ambulatory Visit: Admitting: Family Medicine

## 2023-11-01 ENCOUNTER — Ambulatory Visit: Admitting: Podiatry

## 2023-11-01 ENCOUNTER — Encounter: Payer: Self-pay | Admitting: Podiatry

## 2023-11-01 DIAGNOSIS — D696 Thrombocytopenia, unspecified: Secondary | ICD-10-CM

## 2023-11-01 DIAGNOSIS — M79675 Pain in left toe(s): Secondary | ICD-10-CM

## 2023-11-01 DIAGNOSIS — B351 Tinea unguium: Secondary | ICD-10-CM

## 2023-11-01 DIAGNOSIS — M79674 Pain in right toe(s): Secondary | ICD-10-CM | POA: Diagnosis not present

## 2023-11-01 NOTE — Progress Notes (Signed)
 This patient returns to my office for at risk foot care.  This patient requires this care by a professional since this patient will be at risk due to having thrombocytopenia. This patient is unable to cut nails himself since the patient cannot reach his nails.These nails are painful walking and wearing shoes.  This patient presents for at risk foot care today.  General Appearance  Alert, conversant and in no acute stress.  Vascular  Dorsalis pedis and posterior tibial  pulses are  weakly palpable  bilaterally.  Capillary return is within normal limits  bilaterally. Temperature is within normal limits  bilaterally.  Neurologic  Senn-Weinstein monofilament wire test within normal limits  bilaterally. Muscle power within normal limits bilaterally.  Nails Thick disfigured discolored nails with subungual debris  from hallux to fifth toes bilaterally. No evidence of bacterial infection or drainage bilaterally.  Orthopedic  No limitations of motion  feet .  No crepitus or effusions noted.  No bony pathology or digital deformities noted.  Skin  normotropic skin with no porokeratosis noted bilaterally.  No signs of infections or ulcers noted.     Onychomycosis  Pain in right toes  Pain in left toes  Consent was obtained for treatment procedures.   Mechanical debridement of nails 1-5  bilaterally performed with a nail nipper.  Filed with dremel without incident.    Return office visit     3 months                 Told patient to return for periodic foot care and evaluation due to potential at risk complications.   Cordella Bold DPM

## 2023-11-13 ENCOUNTER — Other Ambulatory Visit

## 2023-11-28 ENCOUNTER — Ambulatory Visit (INDEPENDENT_AMBULATORY_CARE_PROVIDER_SITE_OTHER)

## 2023-11-28 DIAGNOSIS — Z23 Encounter for immunization: Secondary | ICD-10-CM

## 2023-11-28 DIAGNOSIS — E538 Deficiency of other specified B group vitamins: Secondary | ICD-10-CM

## 2023-11-28 MED ORDER — CYANOCOBALAMIN 1000 MCG/ML IJ SOLN
1000.0000 ug | Freq: Once | INTRAMUSCULAR | Status: AC
  Administered 2023-11-28: 1000 ug via INTRAMUSCULAR

## 2023-11-28 NOTE — Progress Notes (Signed)
 Per orders of Dr. Arlyss Solian, injection of B-12 left Deltoid and flu right deltoid given by Melodie Ashworth Y Devan Danzer.  Patient tolerated injection well.

## 2023-12-28 ENCOUNTER — Ambulatory Visit (INDEPENDENT_AMBULATORY_CARE_PROVIDER_SITE_OTHER)

## 2023-12-28 ENCOUNTER — Ambulatory Visit

## 2023-12-28 DIAGNOSIS — E538 Deficiency of other specified B group vitamins: Secondary | ICD-10-CM | POA: Diagnosis not present

## 2023-12-28 MED ORDER — CYANOCOBALAMIN 1000 MCG/ML IJ SOLN
1000.0000 ug | Freq: Once | INTRAMUSCULAR | Status: AC
  Administered 2023-12-28: 1000 ug via INTRAMUSCULAR

## 2023-12-28 NOTE — Progress Notes (Signed)
Patient presented for B 12 injection given by Makinzy Cleere, CMA to right deltoid, patient voiced no concerns nor showed any signs of distress during injection.  

## 2024-01-18 ENCOUNTER — Other Ambulatory Visit: Payer: Self-pay | Admitting: Family Medicine

## 2024-01-18 DIAGNOSIS — F418 Other specified anxiety disorders: Secondary | ICD-10-CM

## 2024-01-18 NOTE — Telephone Encounter (Signed)
 Name of Medication:  Alprazolam  Name of Pharmacy:  [facility] Last Fill or Written Date and Quantity:  10/10/23, #30 Last Office Visit and Type:  08/11/23, vit B12 Next Office Visit and Type:  none Last Controlled Substance Agreement Date:  none Last UDS:  none

## 2024-02-04 ENCOUNTER — Other Ambulatory Visit: Payer: Self-pay | Admitting: Family Medicine

## 2024-02-04 DIAGNOSIS — E039 Hypothyroidism, unspecified: Secondary | ICD-10-CM

## 2024-02-06 NOTE — Telephone Encounter (Signed)
 E-scribed refill.   Pls schedule annual exam and fasting labs for additional refills.

## 2024-02-06 NOTE — Telephone Encounter (Signed)
 Called and schedule pt for annual exam and fasting labs

## 2024-02-07 ENCOUNTER — Ambulatory Visit: Payer: Medicare Other

## 2024-02-07 VITALS — Ht 64.0 in | Wt 157.0 lb

## 2024-02-07 DIAGNOSIS — Z0000 Encounter for general adult medical examination without abnormal findings: Secondary | ICD-10-CM | POA: Diagnosis not present

## 2024-02-07 NOTE — Patient Instructions (Signed)
 Larry [name],  Thank you for taking the time for your Medicare Wellness Visit. I appreciate your continued commitment to your health goals. Please review the care plan we discussed, and feel free to reach out if I can assist you further.  Please note that Annual Wellness Visits do not include a physical exam. Some assessments may be limited, especially if the visit was conducted virtually. If needed, we may recommend an in-person follow-up with your provider.  Ongoing Care Seeing your primary care provider every 3 to 6 months helps us  monitor your health and provide consistent, personalized care.   Referrals If a referral was made during today's visit and you haven't received any updates within two weeks, please contact the referred provider directly to check on the status.  Recommended Screenings:  Health Maintenance  Topic Date Due   Zoster (Shingles) Vaccine (1 of 2) 08/20/1981   COVID-19 Vaccine (4 - 2025-26 season) 11/20/2023   Medicare Annual Wellness Visit  02/06/2024   DTaP/Tdap/Td vaccine (4 - Tdap) 10/12/2032   Pneumococcal Vaccine for age over 1  Completed   Flu Shot  Completed   Meningitis B Vaccine  Aged Out       02/07/2024    9:29 AM  Advanced Directives  Does Patient Have a Medical Advance Directive? Yes  Type of Estate Agent of [place];Living will  Copy of Healthcare Power of Attorney in Chart? No - copy requested    Vision: Annual vision screenings are recommended for early detection of glaucoma, cataracts, and diabetic retinopathy. These exams can also reveal signs of chronic conditions such as diabetes and high blood pressure.  Dental: Annual dental screenings help detect early signs of oral cancer, gum disease, and other conditions linked to overall health, including heart disease and diabetes.

## 2024-02-07 NOTE — Progress Notes (Signed)
 Please attest and cosign this visit due to patients primary care provider not being in the office at the time the visit was completed.    Chief Complaint  Patient presents with   Medicare Wellness     Subjective:   Larry [name] is a 89 y.o. male who presents for a Medicare Annual Wellness Visit. Pt wife Larry [name] completed interview.  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   Esophageal reflux    Family history of diabetes mellitus    History of chickenpox    Hypertriglyceridemia    Other and unspecified hyperlipidemia    Pre-syncope    Thrombocytopenia, unspecified    Unspecified hypothyroidism    Past Surgical History:  Procedure Laterality Date   APPENDECTOMY  1949   Family History  Problem Relation Age of Onset   Stroke Mother    Diabetes Mother        borderline   Pneumonia Father    Hypertension Sister    Hyperlipidemia Sister    Alzheimer's disease Brother    Dementia Brother    Liver disease Sister        cirrhosis, nondrinker   Arthritis Other    Diabetes Other        1st degree relative   Hyperlipidemia Other    Hypertension Other    Prostate cancer Neg Hx    Colon cancer Neg Hx    Social History   Occupational History   Occupation: Retired from [employer]    Employer: RETIRED  Tobacco Use   Smoking status: Never   Smokeless tobacco: Never   Tobacco comments:    smoked only as a teenage small amount  Vaping Use   Vaping status: Never Used  Substance and Sexual Activity   Alcohol use: No   Drug use: No   Sexual activity: Not on file   Tobacco Counseling Counseling given: Not Answered Tobacco comments: smoked only as a  teenage small amount  SDOH Screenings   Food Insecurity: No Food Insecurity (02/07/2024)  Housing: Unknown (02/07/2024)  Transportation Needs: No Transportation Needs (02/07/2024)  Utilities: Not At Risk (02/07/2024)  Alcohol Screen: Low Risk  (02/07/2024)  Depression (PHQ2-9): Low Risk  (02/07/2024)  Financial Resource Strain: Low Risk  (02/06/2023)  Physical Activity: Inactive (02/07/2024)  Social Connections: Moderately Isolated (02/07/2024)  Stress: No Stress Concern Present (02/07/2024)  Tobacco Use: Low Risk  (02/07/2024)  Health Literacy: Inadequate Health Literacy (02/07/2024)   See flowsheets for full screening details  Depression Screen PHQ 2 & 9 Depression Scale- Over the past 2 weeks, how often have you been bothered by any of the following problems? Little interest or pleasure in doing things: 0 Feeling down, depressed, or hopeless (PHQ Adolescent also includes...irritable): 0 PHQ-2 Total Score: 0 Trouble falling or staying asleep, or sleeping too much: 0 Feeling tired or having little energy: 0 Poor appetite or overeating (PHQ Adolescent also includes...weight loss): 0 Feeling bad about yourself - or that you are a failure or have let yourself or your family down: 0 Trouble concentrating on things, such as reading the newspaper or watching television (PHQ Adolescent also includes...like school work): 0 Moving or speaking so slowly that other people could have noticed. Or the opposite - being so fidgety or restless that  you have been moving around a lot more than usual: 0 Thoughts that you would be better off dead, or of hurting yourself in some way: 0 PHQ-9 Total Score: 0 If you checked off any problems, how difficult have these problems made it for you to do your work, take care of things at home, or get along with other people?: Not difficult at all     Goals Addressed             This Visit's Progress    COMPLETED: DIET - EAT MORE FRUITS AND VEGETABLES   Not  on track    COMPLETED: Increase water intake       Starting 12/14/2017, I will attempt to drink at least 6-8 glasses of water daily to prevent dehydration.      none I dont think too much per wife       COMPLETED: Patient Stated       12/27/2019, I will maintain and continue medications as prescribed.        Visit info / Clinical Intake: Medicare Wellness Visit Type:: Subsequent Annual Wellness Visit Persons participating in visit:: patient Medicare Wellness Visit Mode:: Telephone If telephone:: video declined Because this visit was a virtual/telehealth visit:: unable to obtan vitals due to lack of equipment If Telephone or Video please confirm:: I discussed the limitations of evaluation and management by telemedicine Patient Location:: home Provider Location:: clinic Information given by:: family (wife-pt unable to hear and says he does not feel right.) Interpreter Needed?: No Pre-visit prep was completed: yes AWV questionnaire completed by patient prior to visit?: no Living arrangements:: lives with spouse/significant other Patient's Overall Health Status Rating: good Typical amount of pain: some Does pain affect daily life?: (!) yes Are you currently prescribed opioids?: no  Dietary Habits and Nutritional Risks How many meals a day?: 3 Eats fruit and vegetables daily?: (!) no Most meals are obtained by: preparing own meals; having others provide food (a fpk:tpqz indicates she does not do that much cooking) In the last 2 weeks, have you had any of the following?: none Diabetic:: no  Functional Status Activities of Daily Living (to include ambulation/medication): Independent Ambulation: Independent Medication Administration: Independent Home Management: Independent Manage your own finances?: (!) no Primary transportation is: driving (little driving) Concerns about vision?: no *vision screening is required for WTM* Concerns about hearing?: (!) yes Uses hearing aids?: (!)  yes (pt could not hear on the phone) Hear whispered voice?: (!) no *in-person visit only*  Fall Screening Falls in the past year?: 0 Number of falls in past year: 0 Was there an injury with Fall?: 0 Fall Risk Category Calculator: 0 Patient Fall Risk Level: Low Fall Risk  Fall Risk Patient at Risk for Falls Due to: Orthopedic patient; No Fall Risks Fall risk Follow up: Education provided; Falls prevention discussed  Home and Transportation Safety: All rugs have non-skid backing?: N/A, no rugs All stairs or steps have railings?: N/A, no stairs Grab bars in the bathtub or shower?: yes Have non-skid surface in bathtub or shower?: yes Good home lighting?: yes Regular seat belt use?: yes Hospital stays in the last year:: no  Cognitive Assessment Difficulty concentrating, remembering, or making decisions? : yes Will 6CIT or Mini Cog be Completed: no  Advance Directives (For Healthcare) Does Patient Have a Medical Advance Directive?: Yes Type of Advance Directive: Healthcare Power of [place]; Living will Copy of Healthcare Power of Attorney in Chart?: No - copy requested Copy of Living Will in  Chart?: No - copy requested Would patient like information on creating a medical advance directive?: No - Patient declined  Reviewed/Updated  Reviewed/Updated: Reviewed All (Medical, Surgical, Family, Medications, Allergies, Care Teams, Patient Goals)        Objective:    Today's Vitals   02/07/24 0927  Weight: 157 lb (71.2 kg)  Height: 5' 4 (1.626 m)   Body mass index is 26.95 kg/m.  Current Medications (verified) Outpatient Encounter Medications as of 02/07/2024  Medication Sig   ALPRAZolam  (XANAX ) 0.25 MG tablet TAKE ONE-HALF TABLET (0.125 MG TOTAL) BY MOUTH 2 (TWO) TIMES DAILY AS NEEDED FOR ANXIETY.   aspirin  EC 81 MG tablet Take 1 tablet (81 mg total) by mouth daily. Swallow whole.   calcium  carbonate (TUMS - DOSED IN MG ELEMENTAL CALCIUM ) 500 MG chewable tablet Chew 1  tablet by mouth daily as needed for indigestion or heartburn.   Coenzyme Q10 (CO Q 10 PO) Take 1 capsule by mouth daily.   cyanocobalamin  (VITAMIN B12) 1000 MCG/ML injection Inject 1 mL (1,000 mcg total) into the muscle every 14 (fourteen) days.   EPINEPHrine  0.3 mg/0.3 mL IJ SOAJ injection INJECT 0.3 MLS (1 PEN) INTO THE MUSCLE ONCE. AS NEEDED   folic acid  (FOLVITE ) 1 MG tablet Take 1 mg by mouth daily.   KRILL OIL PO Take 1 tablet by mouth daily.   levothyroxine  (SYNTHROID ) 50 MCG tablet TAKE 1 TABLET BY MOUTH DAILY  BEFORE BREAKFAST   methotrexate  (RHEUMATREX) 2.5 MG tablet Take 3 tablets (7.5 mg total) by mouth once a week. Caution:Chemotherapy. Protect from light.   Omega-3 Fatty Acids (FISH OIL PO) Take 1 tablet by mouth daily.   omeprazole  (PRILOSEC) 20 MG capsule Take 1 capsule (20 mg total) by mouth daily as needed.   Red Yeast Rice Extract (RED YEAST RICE PO) Take 1 tablet by mouth daily.   No facility-administered encounter medications on file as of 02/07/2024.   Hearing/Vision screen Vision Screening - Comments:: UTD w/visits to Dr Dingeldein (per wife) Immunizations and Health Maintenance Health Maintenance  Topic Date Due   Zoster Vaccines- Shingrix (1 of 2) 08/20/1981   COVID-19 Vaccine (4 - 2025-26 season) 11/20/2023   Medicare Annual Wellness (AWV)  02/06/2024   DTaP/Tdap/Td (4 - Tdap) 10/12/2032   Pneumococcal Vaccine: 50+ Years  Completed   Influenza Vaccine  Completed   Meningococcal B Vaccine  Aged Out        Assessment/Plan:  This is a routine wellness examination for Karan.  Patient Care Team: Cleatus Arlyss RAMAN, MD as PCP - General (Family Medicine) Joshua Sieving, MD as Consulting Physician (Dermatology) Frutoso Luz, MD as Referring Physician (Allergy )  I have personally reviewed and noted the following in the patient's chart:   Medical and social history Use of alcohol, tobacco or illicit drugs  Current medications and supplements including opioid  prescriptions. Functional ability and status Nutritional status Physical activity Advanced directives List of other physicians Hospitalizations, surgeries, and ER visits in previous 12 months Vitals Screenings to include cognitive, depression, and falls Referrals and appointments  No orders of the defined types were placed in this encounter.  In addition, I have reviewed and discussed with patient certain preventive protocols, quality metrics, and best practice recommendations. A written personalized care plan for preventive services as well as general preventive health recommendations were provided to patient.   Erminio LITTIE Saris, LPN   [id number]/[id number]    After Visit Summary: (MyChart) Due to this being a telephonic visit, the after visit  summary with patients personalized plan was offered to patient via MyChart   Nurse Notes: Pt visit completed with wife. Pt could not hear and handed phone to wife. He went to another room saying he did not feel right. Pt wife indicated he has and is not sick. I encouraged pt wife,Larry [name] to keep an eye on him and call us  if he needs to be seen. Pt/wife has no concerns or questions.  AWV/CPE made for next year simultaneously

## 2024-02-08 ENCOUNTER — Telehealth: Payer: Self-pay

## 2024-02-08 NOTE — Telephone Encounter (Signed)
 Please call her back.  With any acute change like this, I wouldn't wait on office visit and I would proceed to ER.  We can try to move his OV sooner but the main issue is not delaying eval in the meantime (ie recommend ER).  Thanks.
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
## 2024-02-08 NOTE — Telephone Encounter (Signed)
 My dad Larry [name] (Newest Message First)              02/08/24  9:15 AM Sebastian Danna GRADE, CMA routed this conversation to [facility] Triage (Selected Message) [place] JINNY Mixer to [facility] Clinical (supporting Arlyss GORMAN Solian, MD)     02/07/24  4:39 PM Hello Dr. Solian, I'd like to speak to you about dad.  He had another issue today where he could not form words and was unable to speak for a while.

## 2024-02-08 NOTE — Telephone Encounter (Signed)
 Spoke with pt's daughter, Mercy, relaying Dr Elfredia message. She verbalizes understanding but states pt is refusing to ED or see neurology anymore. Angie expressed her frustration and the feeling her hands being tied trying to help pt. She is so concerned that his memory is getting worse and he's so agitated. We scheduled OV for pt on 02/12/24 at 12:00 with Dr Cleatus. I also encouraged Angie to bring in the medical POA she has for pt so we can scan it into his chart and that will allow us  to make her phn number the primary contact vs the pt's phn number. Angie agreed and expresses her sincere thanks for listening and scheduling pt. Fyi to Dr Cleatus.

## 2024-02-08 NOTE — Telephone Encounter (Signed)
 I spoke with pts daughter (DPR signed) Jon said 02/07/24 pt had episode(not sure how long lasted) where pt could not form words or speak earlier today.pt was very confused after began to talk. Pts wife and daughter are also very concerned that pt is still driving. Pt has gone now to get lunch for he and his wife. Pt is worsening like putting coffee maker in refrigerator. Advised to take pt to ED for eval and testing. Jon said pt will not go due to having to wait at ED. Jon does not want pt to know that she has called and wants to see if Dr Cleatus or Dr Onita would call pt and say have not seen you for awhile and need to make  appt. Pt last saw neurologist last May and was advised if pt did not get better or worsened to bring pt back. Jon is going to call Dr Onita office to see if they will call pt and schedule appt. Jon wants note sent to Dr Cleatus to see what he would say about getting pt back into an office whether be neurology or [facility]. UC & ED precautions given again and angela voiced understanding and would appreciate call back after Dr Cleatus reviews note. Sending note to Dr Cleatus, Cleatus pool and will teams [facility] CMA.

## 2024-02-08 NOTE — Telephone Encounter (Addendum)
 Spoke with pt to relay Dr Elfredia message and to ask for Angie. Pt states she doesn't live there then asked what the call was about. I started relaying Dr Elfredia message but pt states he wasn't aware of what I was talking about. I thanked pt for his time and ended call.   Lvm asking pt's daughter, Mercy (on dpr), to call back. Pls relay Dr Elfredia message about pt.

## 2024-02-09 NOTE — Telephone Encounter (Signed)
 Noted.  Late entry.  Talked to Angie last night.  I thanked her for her effort.  He is scheduled to come in next week and we can talk about his situation then.

## 2024-02-12 ENCOUNTER — Encounter: Payer: Self-pay | Admitting: Family Medicine

## 2024-02-12 ENCOUNTER — Ambulatory Visit: Admitting: Family Medicine

## 2024-02-12 VITALS — BP 132/80 | HR 68 | Temp 97.9°F | Ht 64.0 in | Wt 157.0 lb

## 2024-02-12 DIAGNOSIS — E039 Hypothyroidism, unspecified: Secondary | ICD-10-CM | POA: Diagnosis not present

## 2024-02-12 DIAGNOSIS — R413 Other amnesia: Secondary | ICD-10-CM

## 2024-02-12 DIAGNOSIS — E871 Hypo-osmolality and hyponatremia: Secondary | ICD-10-CM | POA: Diagnosis not present

## 2024-02-12 DIAGNOSIS — E538 Deficiency of other specified B group vitamins: Secondary | ICD-10-CM

## 2024-02-12 DIAGNOSIS — K5900 Constipation, unspecified: Secondary | ICD-10-CM

## 2024-02-12 LAB — CBC WITH DIFFERENTIAL/PLATELET
Basophils Absolute: 0 K/uL (ref 0.0–0.1)
Basophils Relative: 0.9 % (ref 0.0–3.0)
Eosinophils Absolute: 0.1 K/uL (ref 0.0–0.7)
Eosinophils Relative: 1.9 % (ref 0.0–5.0)
HCT: 42.9 % (ref 39.0–52.0)
Hemoglobin: 15 g/dL (ref 13.0–17.0)
Lymphocytes Relative: 14.3 % (ref 12.0–46.0)
Lymphs Abs: 0.6 K/uL — ABNORMAL LOW (ref 0.7–4.0)
MCHC: 34.8 g/dL (ref 30.0–36.0)
MCV: 94.9 fl (ref 78.0–100.0)
Monocytes Absolute: 0.4 K/uL (ref 0.1–1.0)
Monocytes Relative: 8.8 % (ref 3.0–12.0)
Neutro Abs: 3 K/uL (ref 1.4–7.7)
Neutrophils Relative %: 74.1 % (ref 43.0–77.0)
Platelets: 151 K/uL (ref 150.0–400.0)
RBC: 4.53 Mil/uL (ref 4.22–5.81)
RDW: 14.5 % (ref 11.5–15.5)
WBC: 4.1 K/uL (ref 4.0–10.5)

## 2024-02-12 LAB — COMPREHENSIVE METABOLIC PANEL WITH GFR
ALT: 18 U/L (ref 0–53)
AST: 16 U/L (ref 0–37)
Albumin: 4.1 g/dL (ref 3.5–5.2)
Alkaline Phosphatase: 69 U/L (ref 39–117)
BUN: 8 mg/dL (ref 6–23)
CO2: 30 meq/L (ref 19–32)
Calcium: 9 mg/dL (ref 8.4–10.5)
Chloride: 96 meq/L (ref 96–112)
Creatinine, Ser: 0.68 mg/dL (ref 0.40–1.50)
GFR: 80.71 mL/min (ref >60.00)
Glucose, Bld: 94 mg/dL (ref 70–99)
Potassium: 3.8 meq/L (ref 3.5–5.1)
Sodium: 131 meq/L — ABNORMAL LOW (ref 135–145)
Total Bilirubin: 0.6 mg/dL (ref 0.2–1.2)
Total Protein: 6.6 g/dL (ref 6.0–8.3)

## 2024-02-12 LAB — VITAMIN B12: Vitamin B-12: 197 pg/mL — ABNORMAL LOW (ref 211–911)

## 2024-02-12 LAB — TSH: TSH: 2.48 u[IU]/mL (ref 0.35–5.50)

## 2024-02-12 MED ORDER — POLYETHYLENE GLYCOL 3350 17 GM/SCOOP PO POWD: 17.0000 g | Freq: Every day | ORAL | Status: AC | PRN

## 2024-02-12 MED ORDER — CYANOCOBALAMIN 1000 MCG/ML IJ SOLN
1000.0000 ug | Freq: Once | INTRAMUSCULAR | Status: AC
  Administered 2024-02-12: 1000 ug via INTRAMUSCULAR

## 2024-02-12 MED ORDER — METHOTREXATE SODIUM 2.5 MG PO TABS: 10.0000 mg | ORAL_TABLET | ORAL | Status: AC

## 2024-02-12 NOTE — Patient Instructions (Addendum)
 Labs today.  B12 shot today.  Take care.  Glad to see you. I would try increasing fiber and then use miralax  if needed.   I wouldn't drive until we can get your labs back and can make some plans.

## 2024-02-12 NOTE — Progress Notes (Unsigned)
 See prev phone notes.  Here today with his daughter.  H/o mildy abnormal EEG.  There is evidence of mild background slowing indicating mild bihemispheric malfunction.   H/o low B12.  Had not had a dose since 12/28/23 per EMR.  H/o low Na.   Still taking levothyroxine  at baseline.  Recheck labs pending.  He had episode where he had trouble working his thermostat at home.  D/w pt.  Episodes of confusion, no consistent loss.   D/w pt about constipation options.  He had been taking prunes.    He is taking 10mg  methotrexate .  He has dermatology f/u pending.   Meds, vitals, and allergies reviewed.   ROS: Per HPI unless specifically indicated in ROS section   Nad Ncat Neck supple, no LA Rrr Ctab Abd soft, not ttp Mild senile purpura.   Trace BLE edema.  MMSE 27 out of 30.  -1 for attention, -2 for recall.

## 2024-02-15 DIAGNOSIS — K5900 Constipation, unspecified: Secondary | ICD-10-CM | POA: Insufficient documentation

## 2024-02-15 NOTE — Assessment & Plan Note (Signed)
 Broad differential discussed. MMSE 27 out of 30.  -1 for attention, -2 for recall. Some days his memory is better than others. We need to recheck his B12 level and his sodium. Recheck TSH. B12 dose given after labs were collected today. I wouldn't drive until we can get his labs back and make additional plans.  He understood. At this point still okay for outpatient follow-up.  36 minutes were devoted to patient care in this encounter (this includes time spent reviewing the patient's file/history, interviewing and examining the patient, counseling/reviewing plan with patient).

## 2024-02-15 NOTE — Assessment & Plan Note (Signed)
 I would try increasing fiber and then use miralax  if needed.  He can update me as needed.  Benign abdominal exam.

## 2024-02-18 ENCOUNTER — Other Ambulatory Visit: Payer: Self-pay | Admitting: Family Medicine

## 2024-02-18 ENCOUNTER — Ambulatory Visit: Payer: Self-pay | Admitting: Family Medicine

## 2024-02-18 DIAGNOSIS — E538 Deficiency of other specified B group vitamins: Secondary | ICD-10-CM

## 2024-02-18 DIAGNOSIS — E785 Hyperlipidemia, unspecified: Secondary | ICD-10-CM

## 2024-02-22 ENCOUNTER — Other Ambulatory Visit

## 2024-02-22 DIAGNOSIS — E538 Deficiency of other specified B group vitamins: Secondary | ICD-10-CM | POA: Diagnosis not present

## 2024-02-22 DIAGNOSIS — E785 Hyperlipidemia, unspecified: Secondary | ICD-10-CM

## 2024-02-22 LAB — COMPREHENSIVE METABOLIC PANEL WITH GFR
ALT: 14 U/L (ref 0–53)
AST: 15 U/L (ref 0–37)
Albumin: 4.2 g/dL (ref 3.5–5.2)
Alkaline Phosphatase: 78 U/L (ref 39–117)
BUN: 9 mg/dL (ref 6–23)
CO2: 29 meq/L (ref 19–32)
Calcium: 8.7 mg/dL (ref 8.4–10.5)
Chloride: 95 meq/L — ABNORMAL LOW (ref 96–112)
Creatinine, Ser: 0.7 mg/dL (ref 0.40–1.50)
GFR: 79.99 mL/min (ref >60.00)
Glucose, Bld: 91 mg/dL (ref 70–99)
Potassium: 4.2 meq/L (ref 3.5–5.1)
Sodium: 131 meq/L — ABNORMAL LOW (ref 135–145)
Total Bilirubin: 0.7 mg/dL (ref 0.2–1.2)
Total Protein: 6.2 g/dL (ref 6.0–8.3)

## 2024-02-22 LAB — LIPID PANEL
Cholesterol: 209 mg/dL — ABNORMAL HIGH (ref 0–200)
HDL: 39.4 mg/dL (ref >39.00)
LDL Cholesterol: 98 mg/dL (ref 0–99)
NonHDL: 169.19
Total CHOL/HDL Ratio: 5
Triglycerides: 356 mg/dL — ABNORMAL HIGH (ref 0.0–149.0)
VLDL: 71.2 mg/dL — ABNORMAL HIGH (ref 0.0–40.0)

## 2024-02-22 LAB — VITAMIN B12: Vitamin B-12: 309 pg/mL (ref 211–911)

## 2024-02-25 ENCOUNTER — Ambulatory Visit: Payer: Self-pay | Admitting: Family Medicine

## 2024-02-29 ENCOUNTER — Encounter: Payer: Self-pay | Admitting: Family Medicine

## 2024-02-29 ENCOUNTER — Ambulatory Visit (INDEPENDENT_AMBULATORY_CARE_PROVIDER_SITE_OTHER): Admitting: Family Medicine

## 2024-02-29 VITALS — BP 138/80 | HR 71 | Temp 98.5°F | Ht 63.5 in | Wt 156.1 lb

## 2024-02-29 DIAGNOSIS — F418 Other specified anxiety disorders: Secondary | ICD-10-CM

## 2024-02-29 DIAGNOSIS — Z7189 Other specified counseling: Secondary | ICD-10-CM

## 2024-02-29 DIAGNOSIS — E039 Hypothyroidism, unspecified: Secondary | ICD-10-CM

## 2024-02-29 DIAGNOSIS — E538 Deficiency of other specified B group vitamins: Secondary | ICD-10-CM

## 2024-02-29 DIAGNOSIS — Z91018 Allergy to other foods: Secondary | ICD-10-CM

## 2024-02-29 DIAGNOSIS — Z0000 Encounter for general adult medical examination without abnormal findings: Secondary | ICD-10-CM

## 2024-02-29 MED ORDER — CYANOCOBALAMIN 1000 MCG/ML IJ SOLN
1000.0000 ug | Freq: Once | INTRAMUSCULAR | Status: AC
  Administered 2024-02-29: 1000 ug via INTRAMUSCULAR

## 2024-02-29 NOTE — Patient Instructions (Signed)
 Please talk to your family about driving.  Let me know what you all think.  Take care.  Glad to see you. B12 today.  Next dose in about 2 weeks.
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
## 2024-03-03 DIAGNOSIS — L209 Atopic dermatitis, unspecified: Secondary | ICD-10-CM | POA: Insufficient documentation

## 2024-03-03 NOTE — Assessment & Plan Note (Addendum)
 My hope is that continued B12 replacement will help him maintain his recall.  Continue replacement.  See above regarding discussion of recent events. I asked him to talk to his family about driving.  See after visit summary.

## 2024-03-03 NOTE — Assessment & Plan Note (Signed)
 Tetanus 2024 PNA UTD Shingles 2009 Flu shot 2025 covid vaccine 2021 RSV vaccine prev done at [facility], 2024 PSA and colon cancer screening deferred given age.   Advance directive-wife designated if patient were incapacitated.

## 2024-03-03 NOTE — Assessment & Plan Note (Signed)
 Very rare use of Xanax  with cautions discussed with patient.

## 2024-03-03 NOTE — Assessment & Plan Note (Signed)
 Advance directive- wife designated if patient were incapacitated.

## 2024-03-03 NOTE — Assessment & Plan Note (Signed)
 Routine cautions d/w pt.

## 2024-03-03 NOTE — Assessment & Plan Note (Signed)
 Per outside clinic.

## 2024-03-03 NOTE — Assessment & Plan Note (Signed)
 Compliant with 50mcg levothyroxine . No ADE on med.  No dysphagia.

## 2024-03-06 ENCOUNTER — Telehealth: Payer: Self-pay | Admitting: Family Medicine

## 2024-03-06 NOTE — Telephone Encounter (Signed)
 Please update patient and his daughter.  There was an incoming MyChart message about his B12 level.  His level is on the low end of normal and I would continue with replacement.  Thanks.

## 2024-03-07 ENCOUNTER — Ambulatory Visit

## 2024-03-07 NOTE — Telephone Encounter (Signed)
 Left message to return call to office. Ok to [employer] below.

## 2024-03-07 NOTE — Telephone Encounter (Signed)
 Copied from CRM #[id number]. Topic: Clinical - Lab/Test Results >> Mar 07, 2024  1:02 PM Carlyon D wrote: Reason for CRM: Pt daughter Mercy is calling as she had a missed call from miss Amy in regards to her father, pt daughter mercy would like a call back.

## 2024-03-08 NOTE — Telephone Encounter (Signed)
 Relayed message to patient about B12 level. Patient wants to know if he can start driving again due to having to get B12 shots every 2 weeks.

## 2024-03-11 NOTE — Telephone Encounter (Signed)
 Did he talk to his family about restarting driving?  I asked him to talk to his family about driving.  Did they come to a consensus?

## 2024-03-12 NOTE — Telephone Encounter (Signed)
 Called pt. No answer and Unable to leave voicemail.

## 2024-03-12 NOTE — Telephone Encounter (Signed)
 Contacted and spoke with patient. Relayed information.pt verbalized understanding. Has no questions or concerns.

## 2024-03-12 NOTE — Telephone Encounter (Signed)
 If he is able to safely drive and he talked with family about it, then I'll defer for now.   Goal B12 would be well above 400, so I would continue with the replacement.  Thanks.

## 2024-03-12 NOTE — Telephone Encounter (Signed)
 Called and spoke with patient.  Patient states that he did speak with family and says that they are okay with him driving short distances.  Pt would like PCP to be aware that he is driving himself to his Nurse visit. (3 miles)  Pt Complains that he is doing well and is able to drive. Pt also complains of wanting to know more details on his Vitamin B12 Levels and what number he should be at.

## 2024-03-13 ENCOUNTER — Ambulatory Visit

## 2024-03-13 DIAGNOSIS — E538 Deficiency of other specified B group vitamins: Secondary | ICD-10-CM

## 2024-03-13 MED ORDER — CYANOCOBALAMIN 1000 MCG/ML IJ SOLN
1000.0000 ug | Freq: Once | INTRAMUSCULAR | Status: AC
  Administered 2024-03-13: 1000 ug via INTRAMUSCULAR

## 2024-03-13 NOTE — Progress Notes (Signed)
 Per orders of [employer] DPN AGNP-C, injection of vitamin b 12 given by Laray Arenas in left deltoid. Patient tolerated injection well. Patient will make appointment for 2 week.

## 2024-03-25 ENCOUNTER — Other Ambulatory Visit: Payer: Self-pay | Admitting: Family Medicine

## 2024-03-25 NOTE — Telephone Encounter (Unsigned)
 Copied from CRM #[id number]. Topic: Clinical - Medication Refill >> Mar 25, 2024  4:39 PM Rea ORN wrote: Medication:  EPINEPHrine  0.3 mg/0.3 mL IJ SOAJ injection  *pt pen is currently expired an he would like this sent as soon as possible.  Has the patient contacted their pharmacy? Yes (Agent: If no, request that the patient contact the pharmacy for the refill. If patient does not wish to contact the pharmacy document the reason why and proceed with request.) (Agent: If yes, when and what did the pharmacy advise?)  This is the patient's preferred pharmacy:  [phone] [place] - [address] [place] [address] Phone: [phone] Fax: [phone]  Is this the correct pharmacy for this prescription? Yes If no, delete pharmacy and type the correct one.   Has the prescription been filled recently? No  Is the patient out of the medication? Yes, Expired Epi pen  Has the patient been seen for an appointment in the last year OR does the patient have an upcoming appointment? Yes  Can we respond through MyChart? No  Agent: Please be advised that Rx refills may take up to 3 business days. We ask that you follow-up with your pharmacy.

## 2024-03-27 ENCOUNTER — Ambulatory Visit

## 2024-03-27 ENCOUNTER — Other Ambulatory Visit: Payer: Self-pay

## 2024-03-27 DIAGNOSIS — E538 Deficiency of other specified B group vitamins: Secondary | ICD-10-CM | POA: Diagnosis not present

## 2024-03-27 MED ORDER — CYANOCOBALAMIN 1000 MCG/ML IJ SOLN
1000.0000 ug | Freq: Once | INTRAMUSCULAR | Status: AC
  Administered 2024-03-27: 1000 ug via INTRAMUSCULAR

## 2024-03-27 MED ORDER — EPINEPHRINE 0.3 MG/0.3ML IJ SOAJ: INTRAMUSCULAR | 1 refills | Status: AC

## 2024-03-27 NOTE — Telephone Encounter (Signed)
 Thank you :)

## 2024-03-27 NOTE — Progress Notes (Signed)
 Per orders of Dr. Emmette Harms is out of office and Dr Claire Crick  who is in office injection of vitamin b 12 given by Claretha Crocker in right deltoid. Patient tolerated injection well. Patient will make appointment for 2 week  .

## 2024-03-27 NOTE — Telephone Encounter (Signed)
 Plz notify epipen  sent to local pharmacy  Plz clarify allergy  - is this to bees or beef (alpha gal)? I only see alpha gal thurlow on allergy  list.

## 2024-03-27 NOTE — Telephone Encounter (Signed)
 Pt in office today for nurse visitandpt request refill on epi pen ASAP. 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 Sending note to Dr Cleatus who is out of office and Dr KANDICE who is in office./

## 2024-03-27 NOTE — Telephone Encounter (Signed)
 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 He states his son has a bee allergy  so he mis spoke.  I notified him of sent rx

## 2024-04-10 ENCOUNTER — Ambulatory Visit

## 2024-04-10 DIAGNOSIS — E538 Deficiency of other specified B group vitamins: Secondary | ICD-10-CM

## 2024-04-10 MED ORDER — CYANOCOBALAMIN 1000 MCG/ML IJ SOLN
1000.0000 ug | Freq: Once | INTRAMUSCULAR | Status: AC
  Administered 2024-04-10: 1000 ug via INTRAMUSCULAR

## 2024-04-10 NOTE — Progress Notes (Signed)
 Per orders of Dr. Arlyss Cleatus lovely is out of office and Dr Anton Blas who is in office injection of vitamin b 12 given by Laray Arenas in left deltoid. Patient tolerated injection well. Patient will make appointment for 2 week.

## 2024-05-14 ENCOUNTER — Ambulatory Visit

## 2025-02-24 ENCOUNTER — Other Ambulatory Visit

## 2025-03-03 ENCOUNTER — Ambulatory Visit

## 2025-03-03 ENCOUNTER — Encounter: Admitting: Family Medicine
